# Patient Record
Sex: Female | Born: 2000 | Race: White | Hispanic: No | Marital: Single | Smoking: Never smoker
Health system: Southern US, Community
[De-identification: ages and names within clinical notes are randomized; demographics above are authoritative.]

## PROBLEM LIST (undated history)

## (undated) DIAGNOSIS — Z9889 Other specified postprocedural states: Secondary | ICD-10-CM

## (undated) DIAGNOSIS — F509 Eating disorder, unspecified: Secondary | ICD-10-CM

## (undated) DIAGNOSIS — R6250 Unspecified lack of expected normal physiological development in childhood: Secondary | ICD-10-CM

## (undated) DIAGNOSIS — R5600 Simple febrile convulsions: Secondary | ICD-10-CM

## (undated) DIAGNOSIS — E11649 Type 2 diabetes mellitus with hypoglycemia without coma: Secondary | ICD-10-CM

## (undated) DIAGNOSIS — E109 Type 1 diabetes mellitus without complications: Secondary | ICD-10-CM

## (undated) DIAGNOSIS — F988 Other specified behavioral and emotional disorders with onset usually occurring in childhood and adolescence: Secondary | ICD-10-CM

## (undated) HISTORY — PX: EYE MUSCLE SURGERY: SHX370

## (undated) HISTORY — DX: Simple febrile convulsions: R56.00

## (undated) HISTORY — DX: Unspecified lack of expected normal physiological development in childhood: R62.50

## (undated) HISTORY — DX: Other specified postprocedural states: Z98.890

## (undated) HISTORY — DX: Type 1 diabetes mellitus without complications: E10.9

## (undated) HISTORY — DX: Type 2 diabetes mellitus with hypoglycemia without coma: E11.649

---
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
## 2019-01-23 ENCOUNTER — Other Ambulatory Visit: Payer: Self-pay | Admitting: Pediatrics

## 2019-01-23 DIAGNOSIS — G43009 Migraine without aura, not intractable, without status migrainosus: Secondary | ICD-10-CM

## 2019-01-30 ENCOUNTER — Other Ambulatory Visit (INDEPENDENT_AMBULATORY_CARE_PROVIDER_SITE_OTHER): Payer: Self-pay | Admitting: Family

## 2019-01-30 DIAGNOSIS — IMO0001 Reserved for inherently not codable concepts without codable children: Secondary | ICD-10-CM

## 2019-02-08 ENCOUNTER — Other Ambulatory Visit: Payer: Self-pay | Admitting: Pediatrics

## 2019-02-08 DIAGNOSIS — F4323 Adjustment disorder with mixed anxiety and depressed mood: Secondary | ICD-10-CM

## 2019-02-13 DIAGNOSIS — F509 Eating disorder, unspecified: Secondary | ICD-10-CM | POA: Diagnosis not present

## 2019-02-13 DIAGNOSIS — F331 Major depressive disorder, recurrent, moderate: Secondary | ICD-10-CM | POA: Diagnosis not present

## 2019-02-19 DIAGNOSIS — F509 Eating disorder, unspecified: Secondary | ICD-10-CM | POA: Diagnosis not present

## 2019-02-20 ENCOUNTER — Encounter (INDEPENDENT_AMBULATORY_CARE_PROVIDER_SITE_OTHER): Payer: Self-pay

## 2019-02-26 ENCOUNTER — Encounter (INDEPENDENT_AMBULATORY_CARE_PROVIDER_SITE_OTHER): Payer: Self-pay

## 2019-03-18 ENCOUNTER — Other Ambulatory Visit (INDEPENDENT_AMBULATORY_CARE_PROVIDER_SITE_OTHER): Payer: Self-pay | Admitting: Family

## 2019-03-18 ENCOUNTER — Encounter (INDEPENDENT_AMBULATORY_CARE_PROVIDER_SITE_OTHER): Payer: Self-pay

## 2019-03-18 ENCOUNTER — Ambulatory Visit (INDEPENDENT_AMBULATORY_CARE_PROVIDER_SITE_OTHER): Payer: BLUE CROSS/BLUE SHIELD | Admitting: Family

## 2019-03-18 ENCOUNTER — Encounter (INDEPENDENT_AMBULATORY_CARE_PROVIDER_SITE_OTHER): Payer: Self-pay | Admitting: Family

## 2019-03-18 ENCOUNTER — Other Ambulatory Visit: Payer: Self-pay

## 2019-03-18 VITALS — BP 114/70 | HR 80 | Ht 59.65 in | Wt 138.4 lb

## 2019-03-18 DIAGNOSIS — E782 Mixed hyperlipidemia: Secondary | ICD-10-CM | POA: Diagnosis not present

## 2019-03-18 DIAGNOSIS — R739 Hyperglycemia, unspecified: Secondary | ICD-10-CM | POA: Diagnosis not present

## 2019-03-18 DIAGNOSIS — F54 Psychological and behavioral factors associated with disorders or diseases classified elsewhere: Secondary | ICD-10-CM

## 2019-03-18 DIAGNOSIS — E109 Type 1 diabetes mellitus without complications: Secondary | ICD-10-CM

## 2019-03-18 DIAGNOSIS — F4323 Adjustment disorder with mixed anxiety and depressed mood: Secondary | ICD-10-CM

## 2019-03-18 DIAGNOSIS — F509 Eating disorder, unspecified: Secondary | ICD-10-CM

## 2019-03-18 LAB — POCT GLUCOSE (DEVICE FOR HOME USE): POC Glucose: 91 mg/dl (ref 70–99)

## 2019-03-18 LAB — POCT GLYCOSYLATED HEMOGLOBIN (HGB A1C): Hemoglobin A1C: 6.9 % — AB (ref 4.0–5.6)

## 2019-03-18 NOTE — Patient Instructions (Signed)
-  Always have fast sugar with you in case of low blood sugar (glucose tabs, regular juice or soda, candy) -Always wear your ID that states you have diabetes -Always bring your meter to your visit -Call/Email if you want to review blood sugars   

## 2019-03-18 NOTE — Progress Notes (Signed)
Pediatric Endocrinology Diabetes Consultation Follow-up Visit  [name] 04/21/2001 [id number]  Chief Complaint: Follow-up type 1 diabetes   Verneda SkillHacker, Caroline T, FNP   HPI: Kelli LawlessMadeline  is a 18 y.o. female presenting for follow-up of type 1 diabetes. she is accompanied to this visit by her mother and father.   1. Kelli LawlessMadeline was diagnosed with type 1 diabetes at age 89. At that time she was hospitalized at [facility] and was in DKA. She was in the ICU for 2 days. She was initially followed by Dr. Langston [place]Morris [place] but transferred to this clinic after Dr. Langston MaskerMorris retired. She has been admitted in DKA two additional times since diagnosis. She has been on pump therapy since age 89.  2. Since last visit to [facility] on 12/2018, she reports she has been well.   She reports that she has not been very busy lately. Watching a lot of Netflix. She plans to start school at [employer], doing all her classes online. She is wearing Dexcom G6, finds it very helpful overall. Reports that her insulin went bad twice because of heat. Also finds that when giving injections in abdomen they do not absorb well. She is considering starting insulin pump therapy.   She continues to follow up with counseling and with Adolescent medicine. She is currently on Sertraline and Wellbutrin which she feels is working very well for her.  She had a hard time with her eating disorder for a few months during COVID because she was unable to see her dieticians. She now reports that she is back on track.   Insulin regimen: 35 units of Lantus, Novolog 120/30/5 Hypoglycemia: Able to feel low blood sugars.  No glucagon needed recently.  Dexcom CGm Download   - Avg Bg 176  - Target Range: in 62%, above target 38% and below target 0%   - Pattern of hyperglycemia between 1-5pm.  Med-alert ID: Not currently wearing. Injection sites: arms and hips  Annual labs due: 07/2019 Ophthalmology due: 2019. Discussed importance of annual  dilated eye exam.     3. ROS: Greater than 10 systems reviewed with pertinent positives listed in HPI, otherwise neg. Constitutional: Sleeping well. Good energy and appetite.  Eyes: No changes in vision, denies blurry vision. Wears glasses.  Ears/Nose/Mouth/Throat: No difficulty swallowing. No neck pain  Cardiovascular: No palpitations, denies tachycardia and chest pain.  Respiratory: No increased work of breathing, No SOB  Gastrointestinal: No constipation or diarrhea. No abdominal pain Genitourinary: No nocturia, no polyuria Endocrine: No polydipsia.  No hyperpigmentation Psychiatric: Normal affect.+ anxiety and depression. Currently on Sertaline and Wellbutrin   Past Medical History:   Past Medical History:  Diagnosis Date  . ADD (attention deficit disorder)   . Febrile seizure (HCC)   . History of eye surgery   . Hypoglycemia associated with diabetes (HCC)   . Hypoglycemia associated with diabetes (HCC)   . Physical growth delay   . Type 1 diabetes mellitus not at goal [facility](HCC)     Medications:  Outpatient Encounter Medications as of 03/18/2019  Medication Sig  . ACCU-CHEK FASTCLIX LANCETS MISC Check sugar 10 x daily  . buPROPion (WELLBUTRIN XL) 300 MG 24 hr tablet TAKE 1 TABLET BY MOUTH EVERY DAY  . cloNIDine HCl (KAPVAY) 0.1 MG TB12 ER tablet TAKE 1 TABLET (0.1 MG TOTAL) BY MOUTH AT BEDTIME.  Marland Kitchen. Continuous Blood Gluc Sensor (DEXCOM G6 SENSOR) MISC 1 application by Does not apply route as needed.  . diphenhydrAMINE (BENADRYL) 12.5 MG chewable  tablet Chew 12.5 mg by mouth 4 (four) times daily as needed for allergies.  Marland Kitchen glucagon 1 MG injection Use for Severe Hypoglycemia . Inject 1 mg intramuscularly  . glucose blood test strip CHECK SUGAR 6 X DAILY  . ibuprofen (ADVIL,MOTRIN) 200 MG tablet Take 400 mg by mouth every 6 (six) hours as needed for headache, mild pain or cramping.   . Insulin Pen Needle (BD PEN NEEDLE NANO U/F) 32G X 4 MM MISC Use with insulin pens 6x daily  .  l-methylfolate-B6-B12 (METANX) 3-35-2 MG TABS tablet TAKE 1 TABLET BY MOUTH EVERY DAY  . LANTUS SOLOSTAR 100 UNIT/ML Solostar Pen USE UP TO 50 UNITS DAILY  . levonorgestrel-ethinyl estradiol (LEVORA 0.15/30, 28,) 0.15-30 MG-MCG tablet Take 1 tablet by mouth daily.  . norgestimate-ethinyl estradiol (SPRINTEC 28) 0.25-35 MG-MCG tablet Take 1 tablet daily. Discard placebos and take active pills for continuous cycling  . NOVOLOG FLEXPEN 100 UNIT/ML FlexPen USE UP TO 50 UNITS DAILY  . ondansetron (ZOFRAN) 4 MG tablet Take 1 tablet (4 mg total) by mouth daily as needed for nausea or vomiting.  . sertraline (ZOLOFT) 100 MG tablet TAKE 1 TABLET BY MOUTH EVERY DAY  . sertraline (ZOLOFT) 50 MG tablet TAKE 1 TABLET BY MOUTH EVERY DAY  . SUMAtriptan (IMITREX) 50 MG tablet 1 TAB EVERY 2HRS AS NEEDED MIGRAINE (NO MORE THAN 2 DOSES A DAY MAY REPEAT IN 2HRS IF PERSISTS/RECUR  . [DISCONTINUED] cloNIDine HCl (KAPVAY) 0.1 MG TB12 ER tablet TAKE 1 TABLET (0.1 MG TOTAL) BY MOUTH AT BEDTIME.   No facility-administered encounter medications on file as of 03/18/2019.     Allergies: No Known Allergies  Surgical History: Past Surgical History:  Procedure Laterality Date  . EYE MUSCLE SURGERY      Family History:  Family History  Problem Relation Age of Onset  . Cancer Maternal Grandmother   . Hypertension Maternal Grandfather   . Depression Maternal Grandfather   . Diabetes Paternal Grandfather   . Anxiety disorder Father   . Depression Mother   . Anxiety disorder Sister      Social History: Lives with: Living with mother    Physical Exam:  There were no vitals filed for this visit. There were no vitals taken for this visit. Body mass index: body mass index is unknown because there is no height or weight on file. Blood pressure percentiles are not available for patients who are 18 years or older.  Ht Readings from Last 3 Encounters:  09/03/18 4' 11.45" (1.51 m) (3 %, Z= -1.87)*  08/30/18 4' 11.45"  (1.51 m) (3 %, Z= -1.87)*  07/16/18 4' 11.45" (1.51 m) (3 %, Z= -1.87)*   * Growth percentiles are based on CDC (Girls, 2-20 Years) data.   Wt Readings from Last 3 Encounters:  09/03/18 143 lb 3.2 oz (65 kg) (78 %, Z= 0.78)*  08/30/18 143 lb 3.2 oz (65 kg) (78 %, Z= 0.78)*  07/16/18 136 lb 3.2 oz (61.8 kg) (71 %, Z= 0.54)*   * Growth percentiles are based on CDC (Girls, 2-20 Years) data.   Physical Exam   General: Well developed, well nourished female in no acute distress.  Alert and oriented.  Head: Normocephalic, atraumatic.   Eyes:  Pupils equal and round. EOMI.   Sclera white.  No eye drainage.   Ears/Nose/Mouth/Throat: Nares patent, no nasal drainage.  Normal dentition, mucous membranes moist.   Neck: supple, no cervical lymphadenopathy, no thyromegaly Cardiovascular: regular rate, normal S1/S2, no murmurs Respiratory:  No increased work of breathing.  Lungs clear to auscultation bilaterally.  No wheezes. Abdomen: soft, nontender, nondistended. Normal bowel sounds.  No appreciable masses  Extremities: warm, well perfused, cap refill < 2 sec.   Musculoskeletal: Normal muscle mass.  Normal strength Skin: warm, dry.  No rash or lesions. Neurologic: alert and oriented, normal speech, no tremor  Labs:   Assessment/Plan: Kelli LawlessMadeline is a 18 y.o. female with type 1 diabetes on MDI and CGM therapy. She continues to make good improvements to diabetes care. He hemoglobin A1c has decreased to 6.9% which meets the ADA goal of <7%. Continuing follow up for anxiety and depression with Adolescent medicine.   1-3. DM w/o complication type I, uncontrolled (HCC)/hyperglycemia/Insulin dose change  - 35 units of Lantus  - Novolog 120/30/5 plan  - Reviewed meter and CGM download. Discussed trends and patterns.  - Rotate injection sites to prevent scar tissue.  - bolus 15 minutes prior to eating to limit blood sugar spikes.  - Reviewed carb counting and importance of accurate carb counting.  -  Discussed signs and symptoms of hypoglycemia. Always have glucose available.  - POCT glucose and hemoglobin A1c  - Reviewed growth chart.  - Discussed insulin pump therapy. Gave information on TSlim and OMnipod.   4. Disordered eating - Follow up with Maralyn SagoSarah (therapist)  - Discussed effects that binging and purging have on Type 1 diabetes.   5. Depression/Anxiety  - Sertraline and Wellbutrin  - Close follow up with Adolescent medicine.   6. Maladaptive Behaviors - Discussed barriers to care  - Answered questions.   7. Mixed hyperlipidemia  - 1000 mg of fish oil daily.  - Fasting lipid panel at next visit.   Follow-up:   3 months.   I have spent >25 minutes with >50% of time in counseling, education and instruction. When a patient is on insulin, intensive monitoring of blood glucose levels is necessary to avoid hyperglycemia and hypoglycemia. Severe hyperglycemia/hypoglycemia can lead to hospital admissions and be life threatening.     Gretchen ShortSpenser Tabbetha Kutscher,  FNP-C  Pediatric Specialist  [address]  [place], [id number]  Tele: [phone]

## 2019-03-19 DIAGNOSIS — F411 Generalized anxiety disorder: Secondary | ICD-10-CM | POA: Diagnosis not present

## 2019-03-19 DIAGNOSIS — F5081 Binge eating disorder: Secondary | ICD-10-CM | POA: Diagnosis not present

## 2019-03-29 ENCOUNTER — Other Ambulatory Visit: Payer: Self-pay | Admitting: Pediatrics

## 2019-04-02 DIAGNOSIS — F331 Major depressive disorder, recurrent, moderate: Secondary | ICD-10-CM | POA: Diagnosis not present

## 2019-04-02 DIAGNOSIS — F411 Generalized anxiety disorder: Secondary | ICD-10-CM | POA: Diagnosis not present

## 2019-04-16 DIAGNOSIS — F411 Generalized anxiety disorder: Secondary | ICD-10-CM | POA: Diagnosis not present

## 2019-04-30 DIAGNOSIS — F411 Generalized anxiety disorder: Secondary | ICD-10-CM | POA: Diagnosis not present

## 2019-04-30 DIAGNOSIS — F331 Major depressive disorder, recurrent, moderate: Secondary | ICD-10-CM | POA: Diagnosis not present

## 2019-05-14 ENCOUNTER — Encounter (INDEPENDENT_AMBULATORY_CARE_PROVIDER_SITE_OTHER): Payer: Self-pay

## 2019-05-14 DIAGNOSIS — F411 Generalized anxiety disorder: Secondary | ICD-10-CM | POA: Diagnosis not present

## 2019-05-19 ENCOUNTER — Other Ambulatory Visit (INDEPENDENT_AMBULATORY_CARE_PROVIDER_SITE_OTHER): Payer: Self-pay | Admitting: Family

## 2019-05-28 DIAGNOSIS — F419 Anxiety disorder, unspecified: Secondary | ICD-10-CM | POA: Diagnosis not present

## 2019-06-05 ENCOUNTER — Encounter (INDEPENDENT_AMBULATORY_CARE_PROVIDER_SITE_OTHER): Payer: Self-pay

## 2019-06-06 ENCOUNTER — Other Ambulatory Visit: Payer: Self-pay

## 2019-06-06 DIAGNOSIS — Z20828 Contact with and (suspected) exposure to other viral communicable diseases: Secondary | ICD-10-CM | POA: Diagnosis not present

## 2019-06-06 DIAGNOSIS — Z20822 Contact with and (suspected) exposure to covid-19: Secondary | ICD-10-CM

## 2019-06-08 LAB — NOVEL CORONAVIRUS, NAA: SARS-CoV-2, NAA: NOT DETECTED

## 2019-06-11 DIAGNOSIS — F331 Major depressive disorder, recurrent, moderate: Secondary | ICD-10-CM | POA: Diagnosis not present

## 2019-06-11 DIAGNOSIS — F509 Eating disorder, unspecified: Secondary | ICD-10-CM | POA: Diagnosis not present

## 2019-06-12 ENCOUNTER — Ambulatory Visit (INDEPENDENT_AMBULATORY_CARE_PROVIDER_SITE_OTHER): Payer: BC Managed Care – PPO | Admitting: Pediatrics

## 2019-06-12 DIAGNOSIS — F411 Generalized anxiety disorder: Secondary | ICD-10-CM | POA: Diagnosis not present

## 2019-06-12 DIAGNOSIS — F4323 Adjustment disorder with mixed anxiety and depressed mood: Secondary | ICD-10-CM

## 2019-06-12 DIAGNOSIS — F902 Attention-deficit hyperactivity disorder, combined type: Secondary | ICD-10-CM

## 2019-06-12 DIAGNOSIS — Z3041 Encounter for surveillance of contraceptive pills: Secondary | ICD-10-CM | POA: Diagnosis not present

## 2019-06-12 DIAGNOSIS — G4700 Insomnia, unspecified: Secondary | ICD-10-CM

## 2019-06-12 DIAGNOSIS — F509 Eating disorder, unspecified: Secondary | ICD-10-CM

## 2019-06-12 MED ORDER — ESCITALOPRAM OXALATE 10 MG PO TABS: 15.0000 mg | ORAL_TABLET | Freq: Every day | ORAL | 2 refills | Status: DC

## 2019-06-12 NOTE — Progress Notes (Signed)
THIS RECORD MAY CONTAIN CONFIDENTIAL INFORMATION THAT SHOULD NOT BE RELEASED WITHOUT REVIEW OF THE SERVICE PROVIDER.  Virtual Follow-Up Visit via Video Note  I connected with Kelli Davis 's patient  on 06/12/19 at  3:00 PM EDT by a video enabled telemedicine application and verified that I am speaking with the correct person using two identifiers.    This patient visit was completed through the use of an audio/video or telephone encounter in the setting of the State of Emergency due to the COVID-19 Pandemic.  I discussed that the purpose of this telehealth visit is to provide medical care while limiting exposure to the novel coronavirus.       I discussed the limitations of evaluation and management by telemedicine and the availability of in person appointments.    The patient expressed understanding and agreed to proceed.   The patient was physically located at home in [place] or a state in which I am permitted to provide care. The patient and/or parent/guardian understood that s/he may incur co-pays and cost sharing, and agreed to the telemedicine visit. The visit was reasonable and appropriate under the circumstances given the patient's presentation at the time.   The patient and/or parent/guardian has been advised of the potential risks and limitations of this mode of treatment (including, but not limited to, the absence of in-person examination) and has agreed to be treated using telemedicine. The patient's/patient's family's questions regarding telemedicine have been answered.    As this visit was completed in an ambulatory virtual setting, the patient and/or parent/guardian has also been advised to contact their provider's office for worsening conditions, and seek emergency medical treatment and/or call 911 if the patient deems either necessary.    Kelli Davis is a 18 y.o. female referred by Kelli Davis, Kelli T, FNP here today for follow-up of anxiety, depression,  ADHD.   Growth Chart Viewed? not applicable  Previsit planning completed:  yes   History was provided by the patient.  PCP Confirmed?  yes  My Chart Activated?   yes    Plan from Last Visit:   Continue zoloft 150 mg, wellbutrin xl 300 mg daily   Chief Complaint: Increased anxiety, insomnia   History of Present Illness:  Currently staying at dad's house because her mom has COVID. Mom is doing ok, Kelli LawlessMadeline is negative. She is feeling fine with no symptoms. Relationship with dad is fine... he is just "dad" and is "weird."   She has been waiting for transcripts to go [facility] [employer] but she is going to go in the spring.   She just got a job at [employer].   Hasn'Davis been hungry.   Falling asleep about 5 am and waking about 10 am. She is staying asleep, will get up every now and then. Gets only 4-5 hours of sleep every night.   Stopped taking kapvay at bedtime. There was one night where her sugar went low and she didn'Davis hear it, her mom had to take her up. The alarm was on full volume. She is worried about going to sleep due to these episodes.   Glucose has been going lower at night. Tries to go to sleep with glucose at least at 150. Can be low around 5-7 am. Lows are typically about 50. When her alarm wakes her up she does feel low. Last time she is eating before bed is 930-10 pm. Dad makes dinner late. Not having a bedtime snack.   Very anxious about the state of the  world. This has lead to fatigue and some depressive symptoms. Is doing some walking and some walking around with friends. She is on social media/news all the time. She has started volunteering with sister doing [employer] which she stopped because this was increasing anxiety.   PHQ-SADS Last 3 Score only 06/12/2019 06/06/2018 05/17/2018  PHQ-15 Score 7 17 12   Total GAD-7 Score 18 17 17   Score 17 15 18       No LMP recorded. (Menstrual status: Oral contraceptives).  ROS:    No Known Allergies Outpatient Medications Prior  to Visit  Medication Sig Dispense Refill  . ACCU-CHEK FASTCLIX LANCETS MISC Check sugar 10 x daily 300 each 3  . buPROPion (WELLBUTRIN XL) 300 MG 24 hr tablet TAKE 1 TABLET BY MOUTH EVERY DAY 90 tablet 2  . cloNIDine HCl (KAPVAY) 0.1 MG TB12 ER tablet TAKE 1 TABLET (0.1 MG TOTAL) BY MOUTH AT BEDTIME. 90 tablet 1  . Continuous Blood Gluc Sensor (DEXCOM G6 SENSOR) MISC 1 APPLICATION BY DOES NOT APPLY ROUTE AS NEEDED. 3 each 5  . Continuous Blood Gluc Transmit (DEXCOM G6 TRANSMITTER) MISC 1 APPLICATION BY DOES NOT APPLY ROUTE CONTINUOUS AS NEEDED. 1 each 3  . diphenhydrAMINE (BENADRYL) 12.5 MG chewable tablet Chew 12.5 mg by mouth 4 (four) times daily as needed for allergies.    . fluticasone (FLONASE) 50 MCG/ACT nasal spray SPRAY 1 SPRAY INTO EACH NOSTRIL EVERY DAY    . glucagon 1 MG injection Use for Severe Hypoglycemia . Inject 1 mg intramuscularly 2 each 3  . glucose blood test strip CHECK SUGAR 6 X DAILY 600 each 5  . ibuprofen (ADVIL,MOTRIN) 200 MG tablet Take 400 mg by mouth every 6 (six) hours as needed for headache, mild pain or cramping.     . Insulin Pen Needle (BD PEN NEEDLE NANO U/F) 32G X 4 MM MISC Use with insulin pens 6x daily 200 each 6  . l-methylfolate-B6-B12 (METANX) 3-35-2 MG TABS tablet TAKE 1 TABLET BY MOUTH EVERY DAY 90 tablet 0  . LANTUS SOLOSTAR 100 UNIT/ML Solostar Pen USE UP TO 50 UNITS DAILY 5 pen 5  . levonorgestrel-ethinyl estradiol (LEVORA 0.15/30, 28,) 0.15-30 MG-MCG tablet Take 1 tablet by mouth daily. (Patient not taking: Reported on 03/18/2019) 112 tablet 3  . norgestimate-ethinyl estradiol (SPRINTEC 28) 0.25-35 MG-MCG tablet Take 1 tablet daily. Discard placebos and take active pills for continuous cycling 112 tablet 4  . NOVOLOG FLEXPEN 100 UNIT/ML FlexPen USE UP TO 50 UNITS DAILY 45 mL 1  . ondansetron (ZOFRAN) 4 MG tablet Take 1 tablet (4 mg total) by mouth daily as needed for nausea or vomiting. 30 tablet 1  . sertraline (ZOLOFT) 100 MG tablet TAKE 1 TABLET BY  MOUTH EVERY DAY 90 tablet 1  . sertraline (ZOLOFT) 50 MG tablet TAKE 1 TABLET BY MOUTH EVERY DAY 90 tablet 1  . SUMAtriptan (IMITREX) 50 MG tablet 1 TAB EVERY 2HRS AS NEEDED MIGRAINE (NO MORE THAN 2 DOSES A DAY MAY REPEAT IN 2HRS IF PERSISTS/RECUR 10 tablet 0   No facility-administered medications prior to visit.      Patient Active Problem List   Diagnosis Date Noted  . Insulin dose changed ([place]) 09/03/2018  . Mixed hyperlipidemia 09/03/2018  . Generalized anxiety disorder 09/04/2017  . Oral contraceptive pill surveillance 09/04/2017  . Eating disorder 05/16/2016  . Insomnia 04/12/2016  . Adjustment disorder with mixed anxiety and depressed mood 10/08/2015  . ADHD (attention deficit hyperactivity disorder), combined type 05/20/2015  . Maladaptive  health behaviors affecting medical condition 10/17/2013  . Hyperglycemia 07/11/2011  . Hypoglycemia associated with diabetes [facility])     Past Medical History:  Reviewed and updated?  yes Past Medical History:  Diagnosis Date  . ADD (attention deficit disorder)   . Febrile seizure (HCC)   . History of eye surgery   . Hypoglycemia associated with diabetes (HCC)   . Hypoglycemia associated with diabetes (HCC)   . Physical growth delay   . Type 1 diabetes mellitus not at goal [facility])     Family History: Reviewed and updated? yes Family History  Problem Relation Age of Onset  . Cancer Maternal Grandmother   . Hypertension Maternal Grandfather   . Depression Maternal Grandfather   . Diabetes Paternal Grandfather   . Anxiety disorder Father   . Depression Mother   . Anxiety disorder Sister     Tobacco?  no Drugs/ETOH?  no Partner preference?  both Sexually Active?  no  Pregnancy Prevention:  birth control pills, reviewed condoms & plan B Trauma currently or in the pastt?  no Suicidal or Self-Harm thoughts?   no Guns in the home?  no  The following portions of the patient's history were reviewed and updated as appropriate:  allergies, current medications, past family history, past medical history, past social history, past surgical history and problem list.  Visual Observations/Objective:   General Appearance: Well nourished well developed, in no apparent distress.  Eyes: conjunctiva no swelling or erythema ENT/Mouth: No hoarseness, No cough for duration of visit.  Neck: Supple  Respiratory: Respiratory effort normal, normal rate, no retractions or distress.   Cardio: Appears well-perfused, noncyanotic Musculoskeletal: no obvious deformity Skin: visible skin without rashes, ecchymosis, erythema Neuro: Awake and oriented X 3,  Psych:  normal affect, Insight and Judgment appropriate.    Assessment/Plan: 1. Generalized anxiety disorder Change to lexapro from zoloft. Reviewed genesight testing with patient. More likely to have s/e at higher doses... will monitor closely.  - escitalopram (LEXAPRO) 10 MG tablet; Take 1.5 tablets (15 mg total) by mouth daily.  Dispense: 45 tablet; Refill: 2  2. ADHD (attention deficit hyperactivity disorder), combined type Continue wellbutrin xl 300 mg daily.   3. Adjustment disorder with mixed anxiety and depressed mood Change zoloft to lexparo 15 mg daily. Will consider addition of buspar for anxiety if needed in the future.   4. Oral contraceptive pill surveillance Stable.   5. Eating disorder, unspecified type Stable.  6. Insomnia Would benefit from being back on kapvay, but we need to get her night time glucoses under control so she isn'Davis worried about not waking up to lows. Advised she needs to send mychart message to endo provider. Advised to eat bedtime snack with protein and carb before bed. She was in agreement. I let endo provider know about the concerns as well.     I discussed the assessment and treatment plan with the patient and/or parent/guardian.  They were provided an opportunity to ask questions and all were answered.  They agreed with the plan and  demonstrated an understanding of the instructions. They were advised to call back or seek an in-person evaluation in the emergency room if the symptoms worsen or if the condition fails to improve as anticipated.   Follow-up:   2 weeks   Medical decision-making:   I spent 25 minutes on this telehealth visit inclusive of face-to-face video and care coordination time I was located off site during this encounter.   Alfonso Ramus, FNP  CC: Kelli Skill, FNP, Kelli Skill, FNP

## 2019-06-23 ENCOUNTER — Other Ambulatory Visit: Payer: Self-pay | Admitting: Pediatrics

## 2019-06-23 DIAGNOSIS — F411 Generalized anxiety disorder: Secondary | ICD-10-CM

## 2019-06-24 ENCOUNTER — Ambulatory Visit (INDEPENDENT_AMBULATORY_CARE_PROVIDER_SITE_OTHER): Payer: BC Managed Care – PPO | Admitting: Family

## 2019-06-24 ENCOUNTER — Encounter (INDEPENDENT_AMBULATORY_CARE_PROVIDER_SITE_OTHER): Payer: Self-pay | Admitting: Family

## 2019-06-24 ENCOUNTER — Other Ambulatory Visit: Payer: Self-pay

## 2019-06-24 VITALS — BP 120/84 | HR 95 | Wt 135.8 lb

## 2019-06-24 DIAGNOSIS — Z794 Long term (current) use of insulin: Secondary | ICD-10-CM

## 2019-06-24 DIAGNOSIS — R739 Hyperglycemia, unspecified: Secondary | ICD-10-CM | POA: Diagnosis not present

## 2019-06-24 DIAGNOSIS — F509 Eating disorder, unspecified: Secondary | ICD-10-CM

## 2019-06-24 DIAGNOSIS — E109 Type 1 diabetes mellitus without complications: Secondary | ICD-10-CM | POA: Diagnosis not present

## 2019-06-24 DIAGNOSIS — F54 Psychological and behavioral factors associated with disorders or diseases classified elsewhere: Secondary | ICD-10-CM

## 2019-06-24 DIAGNOSIS — E782 Mixed hyperlipidemia: Secondary | ICD-10-CM

## 2019-06-24 DIAGNOSIS — Z23 Encounter for immunization: Secondary | ICD-10-CM | POA: Diagnosis not present

## 2019-06-24 LAB — POCT GLYCOSYLATED HEMOGLOBIN (HGB A1C): Hemoglobin A1C: 7.2 % — AB (ref 4.0–5.6)

## 2019-06-24 LAB — POCT GLUCOSE (DEVICE FOR HOME USE): POC Glucose: 185 mg/dl — AB (ref 70–99)

## 2019-06-24 NOTE — Patient Instructions (Signed)
Reduce lantus to 33 units  - No bedtime snack unless you cover with novolog per your plan  - Look at insulin pump and start order process for Tslim. Contact Chris.   - Send me blood sugars in 1 weeks to review via mychart.   - follow up 6 weeks.

## 2019-06-24 NOTE — Progress Notes (Signed)
Pediatric Endocrinology Diabetes Consultation Follow-up Visit  [name] 07/26/2001 [id number]  Chief Complaint: Follow-up type 1 diabetes   Kelli SkillHacker, Kelli T, FNP   HPI: Kelli LawlessMadeline  is a 18 y.o. female presenting for follow-up of type 1 diabetes. she is accompanied to this visit by her mother and father.   1. Kelli LawlessMadeline was diagnosed with type 1 diabetes at age 89. At that time she was [facility] and was in DKA. She was in the ICU for 2 days. She was initially followed by Dr. Langston MaskerMorris in [place] but transferred to this clinic after Dr. Langston MaskerMorris retired. She has been admitted in DKA two additional times since diagnosis. She has been on pump therapy since age 89.  2. Since last visit to [facility] on 03/2019, she reports she has been well.   Recently been having difficulty with blood sugars. Feels like there are a lot of swings. Was having frequent low blood sugars overnight. She was told to eat a bedtime snack to prevent hypoglycemia overnight but now is running higher most of the time. She reports occasionally missing her Novolog shots but it is rare. She is doing well with carb counting and using Novolog plan. Wearing Dexcom CGM, no issues.   Wants to get an insulin pump, leaning towards Tslim.   Recently changed anxiety medicine, she is taking Kelli Davis 15 mg daily. She has not been able to tell much of a difference. Feels "pretty anxious" most of the time. Following Adolescent Medicine.   She reports that her eating is "ok". She is living with dad right now because mom had COVID. She states that dad is concerned that she is not eating enough. She will see her counselor Kelli SagoSarah tomorrow to discuss further.   Insulin regimen: 35 units of Lantus, Novolog 120/30/5 Hypoglycemia: Able to feel low blood sugars.  No glucagon needed recently.  Dexcom CGm Download   - Avg Bg 203.   - Target range: in target 46% above target 52% and below target 2% Med-alert ID: Not  currently wearing. Injection sites: arms and hips  Annual labs due: 07/2019 Ophthalmology due: 2019. Discussed importance of annual dilated eye exam.     3. ROS: Greater than 10 systems reviewed with pertinent positives listed in HPI, otherwise neg. Constitutional: Sleeping well. Good energy and appetite.  Eyes: No changes in vision, denies blurry vision. Wears glasses.  Ears/Nose/Mouth/Throat: No difficulty swallowing. No neck pain  Cardiovascular: No palpitations, denies tachycardia and chest pain.  Respiratory: No increased work of breathing, No SOB  Gastrointestinal: No constipation or diarrhea. No abdominal pain Genitourinary: No nocturia, no polyuria Endocrine: No polydipsia.  No hyperpigmentation Psychiatric: Normal affect.+ anxiety and depression. Currently on Kelli Davis.   Past Medical History:   Past Medical History:  Diagnosis Date  . ADD (attention deficit disorder)   . Febrile seizure (HCC)   . History of eye surgery   . Hypoglycemia associated with diabetes (HCC)   . Hypoglycemia associated with diabetes (HCC)   . Physical growth delay   . Type 1 diabetes mellitus not at goal [facility](HCC)     Medications:  Outpatient Encounter Medications as of 06/24/2019  Medication Sig  . ACCU-CHEK FASTCLIX LANCETS MISC Check sugar 10 x daily  . buPROPion (WELLBUTRIN XL) 300 MG 24 hr tablet TAKE 1 TABLET BY MOUTH EVERY DAY  . cloNIDine HCl (KAPVAY) 0.1 MG TB12 ER tablet TAKE 1 TABLET (0.1 MG TOTAL) BY MOUTH AT BEDTIME.  Marland Kitchen. Continuous Blood Gluc Sensor (DEXCOM  G6 SENSOR) MISC 1 APPLICATION BY DOES NOT APPLY ROUTE AS NEEDED.  Marland Kitchen Continuous Blood Gluc Transmit (DEXCOM G6 TRANSMITTER) MISC 1 APPLICATION BY DOES NOT APPLY ROUTE CONTINUOUS AS NEEDED.  Marland Kitchen diphenhydrAMINE (BENADRYL) 12.5 MG chewable tablet Chew 12.5 mg by mouth 4 (four) times daily as needed for allergies.  Marland Kitchen escitalopram (Kelli Davis) 10 MG tablet Take 1.5 tablets (15 mg total) by mouth daily.  . fluticasone (FLONASE) 50 MCG/ACT nasal  spray SPRAY 1 SPRAY INTO EACH NOSTRIL EVERY DAY  . glucagon 1 MG injection Use for Severe Hypoglycemia . Inject 1 mg intramuscularly  . glucose blood test strip CHECK SUGAR 6 X DAILY  . ibuprofen (ADVIL,MOTRIN) 200 MG tablet Take 400 mg by mouth every 6 (six) hours as needed for headache, mild pain or cramping.   . Insulin Pen Needle (BD PEN NEEDLE NANO U/F) 32G X 4 MM MISC Use with insulin pens 6x daily  . l-methylfolate-B6-B12 (METANX) 3-35-2 MG TABS tablet TAKE 1 TABLET BY MOUTH EVERY DAY  . LANTUS SOLOSTAR 100 UNIT/ML Solostar Pen USE UP TO 50 UNITS DAILY  . norgestimate-ethinyl estradiol (SPRINTEC 28) 0.25-35 MG-MCG tablet Take 1 tablet daily. Discard placebos and take active pills for continuous cycling  . NOVOLOG FLEXPEN 100 UNIT/ML FlexPen USE UP TO 50 UNITS DAILY  . ondansetron (ZOFRAN) 4 MG tablet Take 1 tablet (4 mg total) by mouth daily as needed for nausea or vomiting.  . SUMAtriptan (IMITREX) 50 MG tablet 1 TAB EVERY 2HRS AS NEEDED MIGRAINE (NO MORE THAN 2 DOSES A DAY MAY REPEAT IN 2HRS IF PERSISTS/RECUR  . [DISCONTINUED] cloNIDine HCl (KAPVAY) 0.1 MG TB12 ER tablet TAKE 1 TABLET (0.1 MG TOTAL) BY MOUTH AT BEDTIME.   No facility-administered encounter medications on file as of 06/24/2019.     Allergies: No Known Allergies  Surgical History: Past Surgical History:  Procedure Laterality Date  . EYE MUSCLE SURGERY      Family History:  Family History  Problem Relation Age of Onset  . Cancer Maternal Grandmother   . Hypertension Maternal Grandfather   . Depression Maternal Grandfather   . Diabetes Paternal Grandfather   . Anxiety disorder Father   . Depression Mother   . Anxiety disorder Sister      Social History: Lives with: Living with mother    Physical Exam:  Vitals:   06/24/19 1619  BP: 120/84  Pulse: (!) 125  Weight: 135 lb 12.8 oz (61.6 kg)   BP 120/84   Pulse (!) 125   Wt 135 lb 12.8 oz (61.6 kg)   BMI 26.84 kg/m  Body mass index: body mass index  is 26.84 kg/m. Blood pressure percentiles are not available for patients who are 18 years or older.  Ht Readings from Last 3 Encounters:  03/18/19 4' 11.65" (1.515 m) (4 %, Z= -1.80)*  09/03/18 4' 11.45" (1.51 m) (3 %, Z= -1.87)*  08/30/18 4' 11.45" (1.51 m) (3 %, Z= -1.87)*   * Growth percentiles are based on CDC (Girls, 2-20 Years) data.   Wt Readings from Last 3 Encounters:  06/24/19 135 lb 12.8 oz (61.6 kg) (67 %, Z= 0.43)*  03/18/19 138 lb 6.4 oz (62.8 kg) (71 %, Z= 0.56)*  09/03/18 143 lb 3.2 oz (65 kg) (78 %, Z= 0.78)*   * Growth percentiles are based on CDC (Girls, 2-20 Years) data.   Physical Exam   General: Well developed, well nourished female in no acute distress.  Alert and oriented.  Head: Normocephalic, atraumatic.  Eyes:  Pupils equal and round. EOMI.   Sclera white.  No eye drainage.   Ears/Nose/Mouth/Throat: Nares patent, no nasal drainage.  Normal dentition, mucous membranes moist.   Neck: supple, no cervical lymphadenopathy, no thyromegaly Cardiovascular: regular rate, normal S1/S2, no murmurs Respiratory: No increased work of breathing.  Lungs clear to auscultation bilaterally.  No wheezes. Abdomen: soft, nontender, nondistended. Normal bowel sounds.  No appreciable masses  Extremities: warm, well perfused, cap refill < 2 sec.   Musculoskeletal: Normal muscle mass.  Normal strength Skin: warm, dry.  No rash or lesions. Neurologic: alert and oriented, normal speech, no tremor  Labs: Results for orders placed or performed in visit on 06/24/19  POCT Glucose (Device for Home Use)  Result Value Ref Range   Glucose Fasting, POC     POC Glucose 185 (A) 70 - 99 mg/dl  POCT HgB B3Z  Result Value Ref Range   Hemoglobin A1C 7.2 (A) 4.0 - 5.6 %   HbA1c POC (<> result, manual entry)     HbA1c, POC (prediabetic range)     HbA1c, POC (controlled diabetic range)       Assessment/Plan: Jayleena is a 18 y.o. female with type 1 diabetes on MDI and CGM therapy. She  is doing well overall weight diabetes care but has recently been having abnormal swings in glucose levels. She is running higher at night which is likely due to 25 grams of carbs she is eating without covering with Novolog before bed. Will reduce Lantus dose and eliminate the free snack. Hemoglobin A1c is 7.2% today. Will benefit from insulin pump therapy.    1-3. DM w/o complication type I, uncontrolled (HCC)/hyperglycemia/Insulin dose change  - Decrease lantus to 33 units  - Novolog 120/30/5 plan  -- Reviewed insulin CGM download. Discussed trends and patterns.  - Rotate injection sites to prevent scar tissue.  - bolus 15 minutes prior to eating to limit blood sugar spikes.  - Reviewed carb counting and importance of accurate carb counting.  - Discussed signs and symptoms of hypoglycemia. Always have glucose available.  - POCT glucose and hemoglobin A1c  - Reviewed growth chart.  - Discussed insulin pumps and different options available. Discussed pros and cons of pump therapy.   4. Disordered eating - Follow up with Kelli Sago (therapist)  - Discussed effects that binging and purging have on Type 1 diabetes.   5. Depression/Anxiety  - Kelli Davis 15 mg daily  - Close follow up with Adolescent medicine.   6. Mixed hyperlipidemia  - 1000 mg of fish oil daily.    Follow-up:   3 months.   I have spent >40  minutes with >50% of time in counseling, education and instruction. When a patient is on insulin, intensive monitoring of blood glucose levels is necessary to avoid hyperglycemia and hypoglycemia. Severe hyperglycemia/hypoglycemia can lead to hospital admissions and be life threatening.     Gretchen Short,  FNP-C  Pediatric Specialist  [address]  [place], [address]  Tele: [phone]

## 2019-06-26 ENCOUNTER — Ambulatory Visit (INDEPENDENT_AMBULATORY_CARE_PROVIDER_SITE_OTHER): Payer: BC Managed Care – PPO | Admitting: Pediatrics

## 2019-06-26 DIAGNOSIS — F902 Attention-deficit hyperactivity disorder, combined type: Secondary | ICD-10-CM | POA: Diagnosis not present

## 2019-06-26 DIAGNOSIS — F4323 Adjustment disorder with mixed anxiety and depressed mood: Secondary | ICD-10-CM

## 2019-06-26 DIAGNOSIS — G4700 Insomnia, unspecified: Secondary | ICD-10-CM

## 2019-06-26 DIAGNOSIS — Z3041 Encounter for surveillance of contraceptive pills: Secondary | ICD-10-CM

## 2019-06-26 DIAGNOSIS — R5383 Other fatigue: Secondary | ICD-10-CM | POA: Diagnosis not present

## 2019-06-26 DIAGNOSIS — F411 Generalized anxiety disorder: Secondary | ICD-10-CM

## 2019-06-26 MED ORDER — L-METHYLFOLATE-B6-B12 3-35-2 MG PO TABS: 1.0000 | ORAL_TABLET | Freq: Every day | ORAL | 0 refills | Status: DC

## 2019-06-26 NOTE — Progress Notes (Signed)
THIS RECORD MAY CONTAIN CONFIDENTIAL INFORMATION THAT SHOULD NOT BE RELEASED WITHOUT REVIEW OF THE SERVICE PROVIDER.  Virtual Follow-Up Visit via Video Note  I connected with Kelli Davis Capelli 's patient  on 06/26/19 at  2:30 PM EST by a video enabled telemedicine application and verified that I am speaking with the correct person using two identifiers.    This patient visit was completed through the use of an audio/video or telephone encounter in the setting of the State of Emergency due to the COVID-19 Pandemic.  I discussed that the purpose of this telehealth visit is to provide medical care while limiting exposure to the novel coronavirus.       I discussed the limitations of evaluation and management by telemedicine and the availability of in person appointments.    The patient expressed understanding and agreed to proceed.   The patient was physically located at home in [place] or a state in which I am permitted to provide care. The patient and/or parent/guardian understood that s/he may incur co-pays and cost sharing, and agreed to the telemedicine visit. The visit was reasonable and appropriate under the circumstances given the patient's presentation at the time.   The patient and/or parent/guardian has been advised of the potential risks and limitations of this mode of treatment (including, but not limited to, the absence of in-person examination) and has agreed to be treated using telemedicine. The patient's/patient's family's questions regarding telemedicine have been answered.    As this visit was completed in an ambulatory virtual setting, the patient and/or parent/guardian has also been advised to contact their provider's office for worsening conditions, and seek emergency medical treatment and/or call 911 if the patient deems either necessary.   Team Care Documentation:  Team care documentation used during this visit? no Team care members present and location: NA   Kelli Davis  Kelli Davis is a 18 y.o. female referred by Verneda SkillHacker, Tameaka Eichhorn T, FNP here today for follow-up of anxiety, depression, adhd, insomnia.   Growth Chart Viewed? not applicable  Previsit planning completed:  yes   History was provided by the patient.  PCP Confirmed?  yes  My Chart Activated?   yes    Plan from Last Visit:   Contact endo regarding blood sugars   Chief Complaint: Med follow up   History of Present Illness:  Trying to talk parents into getting insulin pump.  Woke up the other night having a panic attack Has had a few random ones Overall anxiety has been better post election Back at mom's house after covid  Feels like there is some difference with lexapro but not sure what the difference is but mom and friends are noticing a difference.   Continuous cycling OCP- having some BTB this month. She has to wear a pad. Ongoing for 3 days.    No LMP recorded. (Menstrual status: Oral contraceptives).  Review of Systems  Constitutional: Negative for malaise/fatigue.  Eyes: Negative for double vision.  Respiratory: Negative for shortness of breath.   Cardiovascular: Negative for chest pain and palpitations.  Gastrointestinal: Negative for abdominal pain, constipation, diarrhea, nausea and vomiting.  Genitourinary: Negative for dysuria.  Musculoskeletal: Negative for joint pain and myalgias.  Skin: Negative for rash.  Neurological: Negative for dizziness and headaches.  Endo/Heme/Allergies: Does not bruise/bleed easily.  Psychiatric/Behavioral: Negative for depression. The patient is nervous/anxious and has insomnia.      No Known Allergies Outpatient Medications Prior to Visit  Medication Sig Dispense Refill  . ACCU-CHEK FASTCLIX LANCETS MISC  Check sugar 10 x daily 300 each 3  . buPROPion (WELLBUTRIN XL) 300 MG 24 hr tablet TAKE 1 TABLET BY MOUTH EVERY DAY 90 tablet 2  . cloNIDine HCl (KAPVAY) 0.1 MG TB12 ER tablet TAKE 1 TABLET (0.1 MG TOTAL) BY MOUTH AT BEDTIME. 90  tablet 1  . Continuous Blood Gluc Sensor (DEXCOM G6 SENSOR) MISC 1 APPLICATION BY DOES NOT APPLY ROUTE AS NEEDED. 3 each 5  . Continuous Blood Gluc Transmit (DEXCOM G6 TRANSMITTER) MISC 1 APPLICATION BY DOES NOT APPLY ROUTE CONTINUOUS AS NEEDED. 1 each 3  . diphenhydrAMINE (BENADRYL) 12.5 MG chewable tablet Chew 12.5 mg by mouth 4 (four) times daily as needed for allergies.    Marland Kitchen escitalopram (LEXAPRO) 10 MG tablet Take 1.5 tablets (15 mg total) by mouth daily. 45 tablet 2  . fluticasone (FLONASE) 50 MCG/ACT nasal spray SPRAY 1 SPRAY INTO EACH NOSTRIL EVERY DAY    . glucagon 1 MG injection Use for Severe Hypoglycemia . Inject 1 mg intramuscularly 2 each 3  . glucose blood test strip CHECK SUGAR 6 X DAILY 600 each 5  . ibuprofen (ADVIL,MOTRIN) 200 MG tablet Take 400 mg by mouth every 6 (six) hours as needed for headache, mild pain or cramping.     . Insulin Pen Needle (BD PEN NEEDLE NANO U/F) 32G X 4 MM MISC Use with insulin pens 6x daily 200 each 6  . l-methylfolate-B6-B12 (METANX) 3-35-2 MG TABS tablet TAKE 1 TABLET BY MOUTH EVERY DAY 90 tablet 0  . LANTUS SOLOSTAR 100 UNIT/ML Solostar Pen USE UP TO 50 UNITS DAILY 5 pen 5  . norgestimate-ethinyl estradiol (SPRINTEC 28) 0.25-35 MG-MCG tablet Take 1 tablet daily. Discard placebos and take active pills for continuous cycling 112 tablet 4  . NOVOLOG FLEXPEN 100 UNIT/ML FlexPen USE UP TO 50 UNITS DAILY 45 mL 1  . ondansetron (ZOFRAN) 4 MG tablet Take 1 tablet (4 mg total) by mouth daily as needed for nausea or vomiting. 30 tablet 1  . SUMAtriptan (IMITREX) 50 MG tablet 1 TAB EVERY 2HRS AS NEEDED MIGRAINE (NO MORE THAN 2 DOSES A DAY MAY REPEAT IN 2HRS IF PERSISTS/RECUR 10 tablet 0   No facility-administered medications prior to visit.      Patient Active Problem List   Diagnosis Date Noted  . Insulin dose changed ([place]) 09/03/2018  . Mixed hyperlipidemia 09/03/2018  . Generalized anxiety disorder 09/04/2017  . Oral contraceptive pill surveillance  09/04/2017  . Eating disorder 05/16/2016  . Insomnia 04/12/2016  . Adjustment disorder with mixed anxiety and depressed mood 10/08/2015  . ADHD (attention deficit hyperactivity disorder), combined type 05/20/2015  . Maladaptive health behaviors affecting medical condition 10/17/2013  . Hyperglycemia 07/11/2011  . Hypoglycemia associated with diabetes [facility])     Past Medical History:  Reviewed and updated?  yes Past Medical History:  Diagnosis Date  . ADD (attention deficit disorder)   . Febrile seizure ([place])   . History of eye surgery   . Hypoglycemia associated with diabetes ([place])   . Hypoglycemia associated with diabetes ([place])   . Physical growth delay   . Type 1 diabetes mellitus not at goal [facility])     Family History: Reviewed and updated? yes Family History  Problem Relation Age of Onset  . Cancer Maternal Grandmother   . Hypertension Maternal Grandfather   . Depression Maternal Grandfather   . Diabetes Paternal Grandfather   . Anxiety disorder Father   . Depression Mother   . Anxiety disorder Sister  The following portions of the patient's history were reviewed and updated as appropriate: allergies, current medications, past family history, past medical history, past social history, past surgical history and problem list.  Visual Observations/Objective:   General Appearance: Well nourished well developed, in no apparent distress.  Eyes: conjunctiva no swelling or erythema ENT/Mouth: No hoarseness, No cough for duration of visit.  Neck: Supple  Respiratory: Respiratory effort normal, normal rate, no retractions or distress.   Cardio: Appears well-perfused, noncyanotic Musculoskeletal: no obvious deformity Skin: visible skin without rashes, ecchymosis, erythema Neuro: Awake and oriented X 3,  Psych:  normal affect, Insight and Judgment appropriate.    Assessment/Plan: 1. Fatigue, unspecified type Continue l-mf b complex  - l-methylfolate-B6-B12 (METANX) 3-35-2  MG TABS tablet; Take 1 tablet by mouth daily.  Dispense: 90 tablet; Refill: 0  2. ADHD (attention deficit hyperactivity disorder), combined type Continue wellbutrin xl 300 mg daily   3. Adjustment disorder with mixed anxiety and depressed mood Continue lexapro 15 mg daily. Change seem to be improving anxiety sx   4. Oral contraceptive pill surveillance Continue ocp for now. We discussed if she continues to have btb she would be a good candidate for IUD placement. She was in agreement with this.   5. Insomnia, unspecified type Restart kapvay at bedtime now that glucoses are better overnight.   6. Generalized anxiety disorder Continue lexapro.     I discussed the assessment and treatment plan with the patient and/or parent/guardian.  They were provided an opportunity to ask questions and all were answered.  They agreed with the plan and demonstrated an understanding of the instructions. They were advised to call back or seek an in-person evaluation in the emergency room if the symptoms worsen or if the condition fails to improve as anticipated.   Follow-up:  6 weeks   Medical decision-making:   I spent 25 minutes on this telehealth visit inclusive of face-to-face video and care coordination time I was located off site during this encounter.   Alfonso Ramus, FNP    CC: [name], FNP, [name], FNP

## 2019-06-29 ENCOUNTER — Other Ambulatory Visit (INDEPENDENT_AMBULATORY_CARE_PROVIDER_SITE_OTHER): Payer: Self-pay | Admitting: Family

## 2019-06-29 ENCOUNTER — Encounter (INDEPENDENT_AMBULATORY_CARE_PROVIDER_SITE_OTHER): Payer: Self-pay

## 2019-06-29 DIAGNOSIS — E1065 Type 1 diabetes mellitus with hyperglycemia: Secondary | ICD-10-CM

## 2019-07-04 ENCOUNTER — Other Ambulatory Visit: Payer: Self-pay | Admitting: Pediatrics

## 2019-07-04 DIAGNOSIS — F411 Generalized anxiety disorder: Secondary | ICD-10-CM

## 2019-07-17 ENCOUNTER — Other Ambulatory Visit: Payer: Self-pay | Admitting: Pediatrics

## 2019-07-17 DIAGNOSIS — F411 Generalized anxiety disorder: Secondary | ICD-10-CM

## 2019-07-17 MED ORDER — ESCITALOPRAM OXALATE 10 MG PO TABS: ORAL_TABLET | ORAL | 1 refills | Status: DC

## 2019-07-30 ENCOUNTER — Other Ambulatory Visit: Payer: Self-pay | Admitting: Pediatrics

## 2019-07-30 DIAGNOSIS — F4323 Adjustment disorder with mixed anxiety and depressed mood: Secondary | ICD-10-CM

## 2019-08-03 ENCOUNTER — Other Ambulatory Visit: Payer: Self-pay | Admitting: Pediatrics

## 2019-08-03 DIAGNOSIS — Z3041 Encounter for surveillance of contraceptive pills: Secondary | ICD-10-CM

## 2019-08-13 ENCOUNTER — Ambulatory Visit (INDEPENDENT_AMBULATORY_CARE_PROVIDER_SITE_OTHER): Payer: BC Managed Care – PPO | Admitting: Family

## 2019-08-19 ENCOUNTER — Ambulatory Visit: Payer: BC Managed Care – PPO | Admitting: Pediatrics

## 2019-09-19 ENCOUNTER — Other Ambulatory Visit (INDEPENDENT_AMBULATORY_CARE_PROVIDER_SITE_OTHER): Payer: Self-pay | Admitting: Family

## 2019-09-22 ENCOUNTER — Other Ambulatory Visit: Payer: Self-pay | Admitting: Pediatrics

## 2019-09-22 DIAGNOSIS — R5383 Other fatigue: Secondary | ICD-10-CM

## 2019-10-07 ENCOUNTER — Ambulatory Visit (INDEPENDENT_AMBULATORY_CARE_PROVIDER_SITE_OTHER): Payer: BC Managed Care – PPO | Admitting: Family

## 2019-10-07 ENCOUNTER — Other Ambulatory Visit: Payer: Self-pay

## 2019-10-07 ENCOUNTER — Encounter (INDEPENDENT_AMBULATORY_CARE_PROVIDER_SITE_OTHER): Payer: Self-pay | Admitting: Family

## 2019-10-07 VITALS — BP 120/80 | HR 84 | Ht 59.49 in | Wt 126.8 lb

## 2019-10-07 DIAGNOSIS — R7309 Other abnormal glucose: Secondary | ICD-10-CM

## 2019-10-07 DIAGNOSIS — F509 Eating disorder, unspecified: Secondary | ICD-10-CM

## 2019-10-07 DIAGNOSIS — R739 Hyperglycemia, unspecified: Secondary | ICD-10-CM | POA: Diagnosis not present

## 2019-10-07 DIAGNOSIS — E782 Mixed hyperlipidemia: Secondary | ICD-10-CM | POA: Diagnosis not present

## 2019-10-07 DIAGNOSIS — E109 Type 1 diabetes mellitus without complications: Secondary | ICD-10-CM

## 2019-10-07 DIAGNOSIS — F4323 Adjustment disorder with mixed anxiety and depressed mood: Secondary | ICD-10-CM

## 2019-10-07 LAB — POCT GLYCOSYLATED HEMOGLOBIN (HGB A1C): Hemoglobin A1C: 7.2 % — AB (ref 4.0–5.6)

## 2019-10-07 LAB — POCT GLUCOSE (DEVICE FOR HOME USE): POC Glucose: 210 mg/dl — AB (ref 70–99)

## 2019-10-07 MED ORDER — BAQSIMI ONE PACK 3 MG/DOSE NA POWD: 1.0000 | NASAL | 1 refills | Status: AC | PRN

## 2019-10-07 NOTE — Patient Instructions (Signed)
-  Always have fast sugar with you in case of low blood sugar (glucose tabs, regular juice or soda, candy) -Always wear your ID that states you have diabetes -Always bring your meter to your visit -Call/Email if you want to review blood sugars  - 32 units of lantus  Novolog per plan   Try to avoid grazing.

## 2019-10-07 NOTE — Progress Notes (Signed)
Pediatric Endocrinology Diabetes Consultation Follow-up Visit  Kelli Davis 11-14-2000 [id number]  Chief Complaint: Follow-up type 1 diabetes   Kelli Skill, FNP   HPI: Kelli Davis  is a 19 y.o. Davis presenting for follow-up of type 1 diabetes. she is accompanied to this visit by her mother and father.   1. Kelli Davis was diagnosed with type 1 diabetes at age 32. At that time she was hospitalized at [facility] center and was in DKA. She was in the ICU for 2 days. She was initially followed by Dr. Langston Davis in [place] but transferred to this clinic after Dr. Langston Davis retired. She has been admitted in DKA two additional times since diagnosis. She has been on pump therapy since age 40.  2. Since last visit to [facility] on 06/2019, she reports she has been well.   She recently stopped her job due to transportation issues, she is waiting on DMV to get her license. She is splitting time with mom and dad again, but mainly staying with her dad. She reports that her diabetes care has been ok but a little "chaotic" and she has been chasing her blood sugars. She is "not doing anything" so she is snacking frequently and stacking insulin. She is wearing Dexcom CGm which is working well for her.  She has started working out 3-4 x per week which she feels has helped both her blood sugars and her mood.   She is following up with Adolescent medicine, taking 15 mg of lexapro per day. She reports that it has made a "huge" difference.   Insulin regimen: 32 units of Lantus, Novolog 120/30/5 Hypoglycemia: Able to feel low blood sugars.  No glucagon needed recently.  Dexcom CGm Download    Med-alert ID: Not currently wearing. Injection sites: arms and hips  Annual labs due: 07/2019 Ophthalmology due: 2019. Discussed importance of annual dilated eye exam.     3. ROS: Greater than 10 systems reviewed with pertinent positives listed in HPI, otherwise neg. Constitutional: Sleeping well. 9 lbs weight  loss  Eyes: No changes in vision, denies blurry vision. Wears glasses.  Ears/Nose/Mouth/Throat: No difficulty swallowing. No neck pain  Cardiovascular: No palpitations, denies tachycardia  Respiratory: No increased work of breathing, No SOB  Gastrointestinal: No constipation or diarrhea. No abdominal pain Genitourinary: No nocturia, no polyuria Endocrine: No polydipsia.  No hyperpigmentation Psychiatric: Normal affect.+ anxiety and depression. Currently on Lexapro.   Past Medical History:   Past Medical History:  Diagnosis Date  . ADD (attention deficit disorder)   . Febrile seizure ([facility])   . History of eye surgery   . Hypoglycemia associated with diabetes ([facility])   . Hypoglycemia associated with diabetes ([facility])   . Physical growth delay   . Type 1 diabetes mellitus not at goal [facility])     Medications:  Outpatient Encounter Medications as of 10/07/2019  Medication Sig  . ACCU-CHEK FASTCLIX LANCETS MISC Check sugar 10 x daily  . buPROPion (WELLBUTRIN XL) 300 MG 24 hr tablet TAKE 1 TABLET BY MOUTH EVERY DAY  . cloNIDine HCl (KAPVAY) 0.1 MG TB12 ER tablet TAKE 1 TABLET (0.1 MG TOTAL) BY MOUTH AT BEDTIME.  Marland Kitchen Continuous Blood Gluc Sensor (DEXCOM G6 SENSOR) MISC 1 APPLICATION BY DOES NOT APPLY ROUTE AS NEEDED.  Marland Kitchen Continuous Blood Gluc Transmit (DEXCOM G6 TRANSMITTER) MISC 1 APPLICATION BY DOES NOT APPLY ROUTE CONTINUOUS AS NEEDED.  Marland Kitchen escitalopram (LEXAPRO) 10 MG tablet TAKE 1 AND 1/2 TABLETS DAILY BY MOUTH  . fluticasone (FLONASE) 50 MCG/ACT  nasal spray SPRAY 1 SPRAY INTO EACH NOSTRIL EVERY DAY  . glucose blood test strip CHECK SUGAR 6 X DAILY  . ibuprofen (ADVIL,MOTRIN) 200 MG tablet Take 400 mg by mouth every 6 (six) hours as needed for headache, mild pain or cramping.   . Insulin Pen Needle (BD PEN NEEDLE NANO U/F) 32G X 4 MM MISC Use with insulin pens 6x daily  . l-methylfolate-B6-B12 (METANX) 3-35-2 MG TABS tablet TAKE 1 TABLET BY MOUTH EVERY DAY  . LANTUS SOLOSTAR 100 UNIT/ML Solostar  Pen USE UP TO 50 UNITS DAILY  . norgestimate-ethinyl estradiol (SPRINTEC 28) 0.25-35 MG-MCG tablet TAKE 1 TABLET DAILY. DISCARD PLACEBOS AND TAKE ACTIVE PILLS FOR CONTINUOUS CYCLING  . NOVOLOG FLEXPEN 100 UNIT/ML FlexPen USE UP TO 50 UNITS DAILY  . ondansetron (ZOFRAN) 4 MG tablet Take 1 tablet (4 mg total) by mouth daily as needed for nausea or vomiting.  . SUMAtriptan (IMITREX) 50 MG tablet 1 TAB EVERY 2HRS AS NEEDED MIGRAINE (NO MORE THAN 2 DOSES A DAY MAY REPEAT IN 2HRS IF PERSISTS/RECUR  . [DISCONTINUED] glucagon 1 MG injection Use for Severe Hypoglycemia . Inject 1 mg intramuscularly  . diphenhydrAMINE (BENADRYL) 12.5 MG chewable tablet Chew 12.5 mg by mouth 4 (four) times daily as needed for allergies.  . Glucagon (BAQSIMI ONE PACK) 3 MG/DOSE POWD Place 1 Dose into the nose as needed.  . [DISCONTINUED] cloNIDine HCl (KAPVAY) 0.1 MG TB12 ER tablet TAKE 1 TABLET (0.1 MG TOTAL) BY MOUTH AT BEDTIME.   No facility-administered encounter medications on file as of 10/07/2019.    Allergies: No Known Allergies  Surgical History: Past Surgical History:  Procedure Laterality Date  . EYE MUSCLE SURGERY      Family History:  Family History  Problem Relation Age of Onset  . Cancer Maternal Grandmother   . Hypertension Maternal Grandfather   . Depression Maternal Grandfather   . Diabetes Paternal Grandfather   . Anxiety disorder Father   . Depression Mother   . Anxiety disorder Sister      Social History: Lives with: Living with mother    Physical Exam:  Vitals:   10/07/19 0903  BP: 120/80  Pulse: 84  Weight: 126 lb 12.8 oz (57.5 kg)  Height: 4' 11.49" (1.511 m)   BP 120/80   Pulse 84   Ht 4' 11.49" (1.511 m)   Wt 126 lb 12.8 oz (57.5 kg)   BMI 25.19 kg/m  Body mass index: body mass index is 25.19 kg/m. Blood pressure percentiles are not available for patients who are 18 years or older.  Ht Readings from Last 3 Encounters:  10/07/19 4' 11.49" (1.511 m) (3 %, Z= -1.87)*   03/18/19 4' 11.65" (1.515 m) (4 %, Z= -1.80)*  09/03/18 4' 11.45" (1.51 m) (3 %, Z= -1.87)*   * Growth percentiles are based on CDC (Girls, 2-20 Years) data.   Wt Readings from Last 3 Encounters:  10/07/19 126 lb 12.8 oz (57.5 kg) (50 %, Z= 0.00)*  06/24/19 135 lb 12.8 oz (61.6 kg) (67 %, Z= 0.43)*  03/18/19 138 lb 6.4 oz (62.8 kg) (71 %, Z= 0.56)*   * Growth percentiles are based on CDC (Girls, 2-20 Years) data.   Physical Exam   General: Well developed, well nourished Davis in no acute distress.  Alert and oriented.  Head: Normocephalic, atraumatic.   Eyes:  Pupils equal and round. EOMI.   Sclera white.  No eye drainage.  + glasses  Ears/Nose/Mouth/Throat: Nares patent, no nasal drainage.  Normal dentition, mucous membranes moist.   Neck: supple, no cervical lymphadenopathy, no thyromegaly Cardiovascular: regular rate, normal S1/S2, no murmurs Respiratory: No increased work of breathing.  Lungs clear to auscultation bilaterally.  No wheezes. Abdomen: soft, nontender, nondistended. Normal bowel sounds.  No appreciable masses  Extremities: warm, well perfused, cap refill < 2 sec.   Musculoskeletal: Normal muscle mass.  Normal strength Skin: warm, dry.  No rash or lesions. Neurologic: alert and oriented, normal speech, no tremor   Labs: Results for orders placed or performed in visit on 10/07/19  POCT Glucose (Device for Home Use)  Result Value Ref Range   Glucose Fasting, POC     POC Glucose 210 (A) 70 - 99 mg/dl  POCT glycosylated hemoglobin (Hb A1C)  Result Value Ref Range   Hemoglobin A1C 7.2 (A) 4.0 - 5.6 %   HbA1c POC (<> result, manual entry)     HbA1c, POC (prediabetic range)     HbA1c, POC (controlled diabetic range)       Assessment/Plan: Lawrie is a 19 y.o. Davis with type 1 diabetes on MDI and CGM therapy. Having more variability in blood sugars recently due to snacking/stacking insulin. Her hemoglobin A1c is 7.2% which is slightly higher then ADA goal of  <7%. Anxiety and depression improving on lexapro.   1-3. DM w/o complication type I, uncontrolled (HCC)/hyperglycemia/Insulin dose change  - 32 units of Lantus   - Novolog 120/30/5 plan  - Reviewed meter and CGM download. Discussed trends and patterns.  - Rotate injection sites to prevent scar tissue.  - bolus 15 minutes prior to eating to limit blood sugar spikes.  - Reviewed carb counting and importance of accurate carb counting.  - Discussed signs and symptoms of hypoglycemia. Always have glucose available.  - POCT glucose and hemoglobin A1c  - Reviewed growth chart.  - Lipid panel, TFTs and microalbumin ordered.   4. Disordered eating - Follow up with Judson Roch (therapist)   - Discussed how binging and purging can effect diabetes.   5. Depression/Anxiety  - Lexapro 15 mg daily  - Close follow up with Adolescent medicine.   6. Mixed hyperlipidemia  - Take 1000 mg of fish oil daily  - Lipid panel ordered    Follow-up:   3 months.   >45 spent today reviewing the medical chart, counseling the patient/family, and documenting today's visit.  When a patient is on insulin, intensive monitoring of blood glucose levels is necessary to avoid hyperglycemia and hypoglycemia. Severe hyperglycemia/hypoglycemia can lead to hospital admissions and be life threatening.     Hermenia Bers,  FNP-C  Pediatric Specialist  [address] [place]  [place], [address]  Tele: [phone]

## 2019-10-08 LAB — LIPID PANEL
Cholesterol: 294 mg/dL — ABNORMAL HIGH (ref <170)
HDL: 102 mg/dL (ref >45)
LDL Cholesterol (Calc): 167 mg/dL (calc) — ABNORMAL HIGH (ref <110)
Non-HDL Cholesterol (Calc): 192 mg/dL (calc) — ABNORMAL HIGH (ref <120)
Total CHOL/HDL Ratio: 2.9 (calc) (ref <5.0)
Triglycerides: 119 mg/dL — ABNORMAL HIGH (ref <90)

## 2019-10-08 LAB — MICROALBUMIN / CREATININE URINE RATIO
Creatinine, Urine: 73 mg/dL (ref 20–275)
Microalb Creat Ratio: 15 mcg/mg creat (ref <30)
Microalb, Ur: 1.1 mg/dL

## 2019-10-08 LAB — TSH: TSH: 1.91 mIU/L

## 2019-10-08 LAB — T4, FREE: Free T4: 1 ng/dL (ref 0.8–1.4)

## 2019-10-18 ENCOUNTER — Other Ambulatory Visit (INDEPENDENT_AMBULATORY_CARE_PROVIDER_SITE_OTHER): Payer: Self-pay | Admitting: Family

## 2019-10-18 DIAGNOSIS — E1065 Type 1 diabetes mellitus with hyperglycemia: Secondary | ICD-10-CM

## 2019-11-29 DIAGNOSIS — Z20828 Contact with and (suspected) exposure to other viral communicable diseases: Secondary | ICD-10-CM | POA: Diagnosis not present

## 2019-11-29 DIAGNOSIS — Z03818 Encounter for observation for suspected exposure to other biological agents ruled out: Secondary | ICD-10-CM | POA: Diagnosis not present

## 2020-01-06 ENCOUNTER — Ambulatory Visit (INDEPENDENT_AMBULATORY_CARE_PROVIDER_SITE_OTHER): Payer: BC Managed Care – PPO | Admitting: Family

## 2020-01-07 ENCOUNTER — Ambulatory Visit (INDEPENDENT_AMBULATORY_CARE_PROVIDER_SITE_OTHER): Payer: BC Managed Care – PPO | Admitting: Family

## 2020-01-07 NOTE — Progress Notes (Deleted)
Pediatric Endocrinology Diabetes Consultation Follow-up Visit  Kelli Davis 2000/09/15 [id number]  Chief Complaint: Follow-up type 1 diabetes   [name], FNP   HPI: Kelli Davis  is a 19 y.o. female presenting for follow-up of type 1 diabetes. she is accompanied to this visit by her mother and father.   22. Kelli Davis was diagnosed with type 1 diabetes at age 69. At that time she was hospitalized at [facility] center and was in DKA. She was in the ICU for 2 days. She was initially followed by Dr. Lynnette Caffey in [place] but transferred to this clinic after Dr. Lynnette Caffey retired. She has been admitted in DKA two additional times since diagnosis. She has been on pump therapy since age 17.  2. Since last visit to [facility] on 09/2019, she reports she has been well.   She recently stopped her job due to transportation issues, she is waiting on DMV to get her license. She is splitting time with mom and dad again, but mainly staying with her dad. She reports that her diabetes care has been ok but a little "chaotic" and she has been chasing her blood sugars. She is "not doing anything" so she is snacking frequently and stacking insulin. She is wearing Dexcom CGm which is working well for her.  She has started working out 3-4 x per week which she feels has helped both her blood sugars and her mood.   She is following up with Adolescent medicine, taking 15 mg of lexapro per day. She reports that it has made a "huge" difference.   Insulin regimen: 32 units of Lantus, Novolog 120/30/5 Hypoglycemia: Able to feel low blood sugars.  No glucagon needed recently.  Dexcom CGm Download    Med-alert ID: Not currently wearing. Injection sites: arms and hips  Annual labs due: 07/2019 Ophthalmology due: 2019. Discussed importance of annual dilated eye exam.     3. ROS: Greater than 10 systems reviewed with pertinent positives listed in HPI, otherwise neg. Constitutional: Sleeping well. 9 lbs weight  loss  Eyes: No changes in vision, denies blurry vision. Wears glasses.  Ears/Nose/Mouth/Throat: No difficulty swallowing. No neck pain  Cardiovascular: No palpitations, denies tachycardia  Respiratory: No increased work of breathing, No SOB  Gastrointestinal: No constipation or diarrhea. No abdominal pain Genitourinary: No nocturia, no polyuria Endocrine: No polydipsia.  No hyperpigmentation Psychiatric: Normal affect.+ anxiety and depression. Currently on Lexapro.   Past Medical History:   Past Medical History:  Diagnosis Date  . ADD (attention deficit disorder)   . Febrile seizure ([place])   . History of eye surgery   . Hypoglycemia associated with diabetes ([place])   . Hypoglycemia associated with diabetes ([place])   . Physical growth delay   . Type 1 diabetes mellitus not at goal [facility])     Medications:  Outpatient Encounter Medications as of 01/07/2020  Medication Sig  . ACCU-CHEK FASTCLIX LANCETS MISC Check sugar 10 x daily  . buPROPion (WELLBUTRIN XL) 300 MG 24 hr tablet TAKE 1 TABLET BY MOUTH EVERY DAY  . cloNIDine HCl (KAPVAY) 0.1 MG TB12 ER tablet TAKE 1 TABLET (0.1 MG TOTAL) BY MOUTH AT BEDTIME.  Marland Kitchen Continuous Blood Gluc Sensor (DEXCOM G6 SENSOR) MISC 1 APPLICATION BY DOES NOT APPLY ROUTE AS NEEDED.  Marland Kitchen Continuous Blood Gluc Transmit (DEXCOM G6 TRANSMITTER) MISC 1 APPLICATION BY DOES NOT APPLY ROUTE CONTINUOUS AS NEEDED.  Marland Kitchen diphenhydrAMINE (BENADRYL) 12.5 MG chewable tablet Chew 12.5 mg by mouth 4 (four) times daily as needed for allergies.  Marland Kitchen  escitalopram (LEXAPRO) 10 MG tablet TAKE 1 AND 1/2 TABLETS DAILY BY MOUTH  . fluticasone (FLONASE) 50 MCG/ACT nasal spray SPRAY 1 SPRAY INTO EACH NOSTRIL EVERY DAY  . Glucagon (BAQSIMI ONE PACK) 3 MG/DOSE POWD Place 1 Dose into the nose as needed.  Marland Kitchen glucose blood test strip CHECK SUGAR 6 X DAILY  . ibuprofen (ADVIL,MOTRIN) 200 MG tablet Take 400 mg by mouth every 6 (six) hours as needed for headache, mild pain or cramping.   . Insulin Pen  Needle (BD PEN NEEDLE NANO U/F) 32G X 4 MM MISC Use with insulin pens 6x daily  . l-methylfolate-B6-B12 (METANX) 3-35-2 MG TABS tablet TAKE 1 TABLET BY MOUTH EVERY DAY  . LANTUS SOLOSTAR 100 UNIT/ML Solostar Pen USE UP TO 50 UNITS DAILY  . norgestimate-ethinyl estradiol (SPRINTEC 28) 0.25-35 MG-MCG tablet TAKE 1 TABLET DAILY. DISCARD PLACEBOS AND TAKE ACTIVE PILLS FOR CONTINUOUS CYCLING  . NOVOLOG FLEXPEN 100 UNIT/ML FlexPen USE UP TO 50 UNITS DAILY  . SUMAtriptan (IMITREX) 50 MG tablet 1 TAB EVERY 2HRS AS NEEDED MIGRAINE (NO MORE THAN 2 DOSES A DAY MAY REPEAT IN 2HRS IF PERSISTS/RECUR  . [DISCONTINUED] cloNIDine HCl (KAPVAY) 0.1 MG TB12 ER tablet TAKE 1 TABLET (0.1 MG TOTAL) BY MOUTH AT BEDTIME.   No facility-administered encounter medications on file as of 01/07/2020.    Allergies: No Known Allergies  Surgical History: Past Surgical History:  Procedure Laterality Date  . EYE MUSCLE SURGERY      Family History:  Family History  Problem Relation Age of Onset  . Cancer Maternal Grandmother   . Hypertension Maternal Grandfather   . Depression Maternal Grandfather   . Diabetes Paternal Grandfather   . Anxiety disorder Father   . Depression Mother   . Anxiety disorder Sister      Social History: Lives with: Living with mother    Physical Exam:  There were no vitals filed for this visit. There were no vitals taken for this visit. Body mass index: body mass index is unknown because there is no height or weight on file. Blood pressure percentiles are not available for patients who are 18 years or older.  Ht Readings from Last 3 Encounters:  10/07/19 4' 11.49" (1.511 m) (3 %, Z= -1.87)*  03/18/19 4' 11.65" (1.515 m) (4 %, Z= -1.80)*  09/03/18 4' 11.45" (1.51 m) (3 %, Z= -1.87)*   * Growth percentiles are based on CDC (Girls, 2-20 Years) data.   Wt Readings from Last 3 Encounters:  10/07/19 126 lb 12.8 oz (57.5 kg) (50 %, Z= 0.00)*  06/24/19 135 lb 12.8 oz (61.6 kg) (67 %, Z=  0.43)*  03/18/19 138 lb 6.4 oz (62.8 kg) (71 %, Z= 0.56)*   * Growth percentiles are based on CDC (Girls, 2-20 Years) data.   Physical Exam   General: Well developed, well nourished female in no acute distress.   Head: Normocephalic, atraumatic.   Eyes:  Pupils equal and round. EOMI.   Sclera white.  No eye drainage.   Ears/Nose/Mouth/Throat: Nares patent, no nasal drainage.  Normal dentition, mucous membranes moist.   Neck: supple, no cervical lymphadenopathy, no thyromegaly Cardiovascular: regular rate, normal S1/S2, no murmurs Respiratory: No increased work of breathing.  Lungs clear to auscultation bilaterally.  No wheezes. Abdomen: soft, nontender, nondistended. Normal bowel sounds.  No appreciable masses  Extremities: warm, well perfused, cap refill < 2 sec.   Musculoskeletal: Normal muscle mass.  Normal strength Skin: warm, dry.  No rash or lesions. Neurologic:  alert and oriented, normal speech, no tremor    Labs: Results for orders placed or performed in visit on 10/07/19  Lipid panel  Result Value Ref Range   Cholesterol 294 (H) <170 mg/dL   HDL 400 >86 mg/dL   Triglycerides 761 (H) <90 mg/dL   LDL Cholesterol (Calc) 167 (H) <110 mg/dL (calc)   Total CHOL/HDL Ratio 2.9 <5.0 (calc)   Non-HDL Cholesterol (Calc) 192 (H) <120 mg/dL (calc)  Microalbumin / creatinine urine ratio  Result Value Ref Range   Creatinine, Urine 73 20 - 275 mg/dL   Microalb, Ur 1.1 mg/dL   Microalb Creat Ratio 15 <30 mcg/mg creat  TSH  Result Value Ref Range   TSH 1.91 mIU/L  T4, free  Result Value Ref Range   Free T4 1.0 0.8 - 1.4 ng/dL  POCT Glucose (Device for Home Use)  Result Value Ref Range   Glucose Fasting, POC     POC Glucose 210 (A) 70 - 99 mg/dl  POCT glycosylated hemoglobin (Hb A1C)  Result Value Ref Range   Hemoglobin A1C 7.2 (A) 4.0 - 5.6 %   HbA1c POC (<> result, manual entry)     HbA1c, POC (prediabetic range)     HbA1c, POC (controlled diabetic range)        Assessment/Plan: Amyjo is a 19 y.o. female with type 1 diabetes on MDI and CGM therapy. Having more variability in blood sugars recently due to snacking/stacking insulin. Her hemoglobin A1c is 7.2% which is slightly higher then ADA goal of <7%. Anxiety and depression improving on lexapro.   1-3. DM w/o complication type I, uncontrolled (HCC)/hyperglycemia/Insulin dose change  - 32 units of Lantus   - Novolog 120/30/5 plan  - Reviewed meter and CGM download. Discussed trends and patterns.  - Rotate injection sites to prevent scar tissue.  - bolus 15 minutes prior to eating to limit blood sugar spikes.  - Reviewed carb counting and importance of accurate carb counting.  - Discussed signs and symptoms of hypoglycemia. Always have glucose available.  - POCT glucose and hemoglobin A1c  - Reviewed growth chart.  - Discussed insulin pump options including Tslim and Omnipod.   4. Disordered eating - Follow up with Maralyn Sago (therapist)   - Discussed how binging and purging can effect diabetes.   5. Depression/Anxiety  - Lexapro 15 mg daily  - Close follow up with Adolescent medicine.   6. Mixed hyperlipidemia  - Take 1000 mg of fish oil daily     Follow-up:   3 months.   >45 spent today reviewing the medical chart, counseling the patient/family, and documenting today's visit.  When a patient is on insulin, intensive monitoring of blood glucose levels is necessary to avoid hyperglycemia and hypoglycemia. Severe hyperglycemia/hypoglycemia can lead to hospital admissions and be life threatening.     Gretchen Short,  FNP-C  Pediatric Specialist  [address]  [place], [address]  Tele: [phone]

## 2020-02-13 ENCOUNTER — Other Ambulatory Visit (INDEPENDENT_AMBULATORY_CARE_PROVIDER_SITE_OTHER): Payer: Self-pay | Admitting: Family

## 2020-02-13 ENCOUNTER — Encounter (INDEPENDENT_AMBULATORY_CARE_PROVIDER_SITE_OTHER): Payer: Self-pay

## 2020-02-13 DIAGNOSIS — E1065 Type 1 diabetes mellitus with hyperglycemia: Secondary | ICD-10-CM

## 2020-03-06 ENCOUNTER — Other Ambulatory Visit: Payer: Self-pay

## 2020-03-06 ENCOUNTER — Ambulatory Visit (INDEPENDENT_AMBULATORY_CARE_PROVIDER_SITE_OTHER): Payer: BC Managed Care – PPO | Admitting: Family

## 2020-03-06 ENCOUNTER — Encounter (INDEPENDENT_AMBULATORY_CARE_PROVIDER_SITE_OTHER): Payer: Self-pay | Admitting: Family

## 2020-03-06 VITALS — BP 114/70 | HR 88 | Wt 118.8 lb

## 2020-03-06 DIAGNOSIS — F4323 Adjustment disorder with mixed anxiety and depressed mood: Secondary | ICD-10-CM

## 2020-03-06 DIAGNOSIS — R7309 Other abnormal glucose: Secondary | ICD-10-CM | POA: Diagnosis not present

## 2020-03-06 DIAGNOSIS — R739 Hyperglycemia, unspecified: Secondary | ICD-10-CM | POA: Diagnosis not present

## 2020-03-06 DIAGNOSIS — F509 Eating disorder, unspecified: Secondary | ICD-10-CM

## 2020-03-06 DIAGNOSIS — E109 Type 1 diabetes mellitus without complications: Secondary | ICD-10-CM | POA: Diagnosis not present

## 2020-03-06 DIAGNOSIS — E782 Mixed hyperlipidemia: Secondary | ICD-10-CM

## 2020-03-06 LAB — POCT GLYCOSYLATED HEMOGLOBIN (HGB A1C): Hemoglobin A1C: 9 % — AB (ref 4.0–5.6)

## 2020-03-06 LAB — POCT GLUCOSE (DEVICE FOR HOME USE): POC Glucose: 113 mg/dl — AB (ref 70–99)

## 2020-03-06 NOTE — Progress Notes (Signed)
Pediatric Endocrinology Diabetes Consultation Follow-up Visit  Kelli Davis [birth year] [id number]  Chief Complaint: Follow-up type 1 diabetes   [name], FNP   HPI: Kelli Davis  is a 19 y.o. female presenting for follow-up of type 1 diabetes. she is accompanied to this visit by her mother and father.   1. Kelli Davis was diagnosed with type 1 diabetes at age 65. At that time she was hospitalized at [facility] center and was in DKA. She was in the ICU for 2 days. She was initially followed by Dr. Langston Masker in [place] but transferred to this clinic after Dr. Langston Masker retired. She has been admitted in DKA two additional times since diagnosis. She has been on pump therapy since age 29.  2. Since last visit to [facility] on 09/2019, she reports she has been well.   She started a job at [employer], she has been very busy. She is not in school at this time but is considering going to [employer]. She is mainly staying with her mom lately.   Using Dexcom CGM and MDI for diabetes management. She is trying to save up money for insulin pump. She feels like her blood sugars have been a little worse since starting her job. She is mainly giving her injections after she eats and reports she never knows how much she is going to eat. Having more hyperglycemia then hypoglycemia. When she gets home from work she is eating and snacking late at night.   She is following up with Adolescent medicine but has not been seen "in a while".  Taking 15 mg of lexapro per day. She feels like it is working well.   She has not been following up with her dietitian since starting work.   Has not been taking fish oil consistently.   Insulin regimen: 30 units of Lantus, Novolog 120/30/5 Hypoglycemia: Able to feel low blood sugars.  No glucagon needed recently.  Dexcom CGm Download    Med-alert ID: Not currently wearing. Injection sites: arms and hips  Annual labs due: 07/2020 Ophthalmology due: 2019. Discussed  importance of annual dilated eye exam.     3. ROS: Greater than 10 systems reviewed with pertinent positives listed in HPI, otherwise neg. Constitutional: Sleeping well. 8 lbs weight loss.  Eyes: No changes in vision, denies blurry vision. Wears glasses.  Ears/Nose/Mouth/Throat: No difficulty swallowing. No neck pain  Cardiovascular: No palpitations, denies tachycardia  Respiratory: No increased work of breathing, No SOB  Gastrointestinal: No constipation or diarrhea. No abdominal pain Genitourinary: No nocturia, no polyuria Endocrine: No polydipsia.  No hyperpigmentation Psychiatric: Normal affect.+ anxiety and depression. Currently on Lexapro.   Past Medical History:   Past Medical History:  Diagnosis Date  . ADD (attention deficit disorder)   . Febrile seizure ([facility])   . History of eye surgery   . Hypoglycemia associated with diabetes ([facility])   . Hypoglycemia associated with diabetes ([facility])   . Physical growth delay   . Type 1 diabetes mellitus not at goal [facility])     Medications:  Outpatient Encounter Medications as of 03/06/2020  Medication Sig  . ACCU-CHEK FASTCLIX LANCETS MISC Check sugar 10 x daily  . buPROPion (WELLBUTRIN XL) 300 MG 24 hr tablet TAKE 1 TABLET BY MOUTH EVERY DAY  . cloNIDine HCl (KAPVAY) 0.1 MG TB12 ER tablet TAKE 1 TABLET (0.1 MG TOTAL) BY MOUTH AT BEDTIME.  Marland Kitchen Continuous Blood Gluc Sensor (DEXCOM G6 SENSOR) MISC 1 APPLICATION BY DOES NOT APPLY ROUTE AS NEEDED.  Marland Kitchen  Continuous Blood Gluc Transmit (DEXCOM G6 TRANSMITTER) MISC 1 APPLICATION BY DOES NOT APPLY ROUTE CONTINUOUS AS NEEDED.  Marland Kitchen diphenhydrAMINE (BENADRYL) 12.5 MG chewable tablet Chew 12.5 mg by mouth 4 (four) times daily as needed for allergies.  Marland Kitchen escitalopram (LEXAPRO) 10 MG tablet TAKE 1 AND 1/2 TABLETS DAILY BY MOUTH  . fluticasone (FLONASE) 50 MCG/ACT nasal spray SPRAY 1 SPRAY INTO EACH NOSTRIL EVERY DAY  . glucose blood test strip CHECK SUGAR 6 X DAILY  . ibuprofen (ADVIL,MOTRIN) 200 MG tablet Take  400 mg by mouth every 6 (six) hours as needed for headache, mild pain or cramping.   . insulin aspart (NOVOLOG FLEXPEN) 100 UNIT/ML FlexPen Inject up to 50 units daily  . Insulin Pen Needle (BD PEN NEEDLE NANO U/F) 32G X 4 MM MISC Use with insulin pens 6x daily  . l-methylfolate-B6-B12 (METANX) 3-35-2 MG TABS tablet TAKE 1 TABLET BY MOUTH EVERY DAY  . LANTUS SOLOSTAR 100 UNIT/ML Solostar Pen USE UP TO 50 UNITS DAILY  . norgestimate-ethinyl estradiol (SPRINTEC 28) 0.25-35 MG-MCG tablet TAKE 1 TABLET DAILY. DISCARD PLACEBOS AND TAKE ACTIVE PILLS FOR CONTINUOUS CYCLING  . SUMAtriptan (IMITREX) 50 MG tablet 1 TAB EVERY 2HRS AS NEEDED MIGRAINE (NO MORE THAN 2 DOSES A DAY MAY REPEAT IN 2HRS IF PERSISTS/RECUR  . Glucagon (BAQSIMI ONE PACK) 3 MG/DOSE POWD Place 1 Dose into the nose as needed. (Patient not taking: Reported on 03/06/2020)  . [DISCONTINUED] cloNIDine HCl (KAPVAY) 0.1 MG TB12 ER tablet TAKE 1 TABLET (0.1 MG TOTAL) BY MOUTH AT BEDTIME.   No facility-administered encounter medications on file as of 03/06/2020.    Allergies: No Known Allergies  Surgical History: Past Surgical History:  Procedure Laterality Date  . EYE MUSCLE SURGERY      Family History:  Family History  Problem Relation Age of Onset  . Cancer Maternal Grandmother   . Hypertension Maternal Grandfather   . Depression Maternal Grandfather   . Diabetes Paternal Grandfather   . Anxiety disorder Father   . Depression Mother   . Anxiety disorder Sister      Social History: Lives with: Living with mother    Physical Exam:  Vitals:   03/06/20 0857  BP: 114/70  Pulse: 88  Weight: 118 lb 12.8 oz (53.9 kg)   BP 114/70   Pulse 88   Wt 118 lb 12.8 oz (53.9 kg)   BMI 23.60 kg/m  Body mass index: body mass index is 23.6 kg/m. Blood pressure percentiles are not available for patients who are 18 years or older.  Ht Readings from Last 3 Encounters:  10/07/19 4' 11.49" (1.511 m) (3 %, Z= -1.87)*  03/18/19 4'  11.65" (1.515 m) (4 %, Z= -1.80)*  09/03/18 4' 11.45" (1.51 m) (3 %, Z= -1.87)*   * Growth percentiles are based on CDC (Girls, 2-20 Years) data.   Wt Readings from Last 3 Encounters:  03/06/20 118 lb 12.8 oz (53.9 kg) (32 %, Z= -0.47)*  10/07/19 126 lb 12.8 oz (57.5 kg) (50 %, Z= 0.00)*  06/24/19 135 lb 12.8 oz (61.6 kg) (67 %, Z= 0.43)*   * Growth percentiles are based on CDC (Girls, 2-20 Years) data.   Physical Exam   General: Well developed, well nourished female in no acute distress.  Head: Normocephalic, atraumatic.   Eyes:  Pupils equal and round. EOMI.   Sclera white.  No eye drainage.   Ears/Nose/Mouth/Throat: Nares patent, no nasal drainage.  Normal dentition, mucous membranes moist.   Neck: supple,  no cervical lymphadenopathy, no thyromegaly Cardiovascular: regular rate, normal S1/S2, no murmurs Respiratory: No increased work of breathing.  Lungs clear to auscultation bilaterally.  No wheezes. Abdomen: soft, nontender, nondistended. Normal bowel sounds.  No appreciable masses  Extremities: warm, well perfused, cap refill < 2 sec.   Musculoskeletal: Normal muscle mass.  Normal strength Skin: warm, dry.  No rash or lesions. Neurologic: alert and oriented, normal speech, no tremor    Labs: Results for orders placed or performed in visit on 03/06/20  POCT Glucose (Device for Home Use)  Result Value Ref Range   Glucose Fasting, POC     POC Glucose 113 (A) 70 - 99 mg/dl  POCT glycosylated hemoglobin (Hb A1C)  Result Value Ref Range   Hemoglobin A1C 9.0 (A) 4.0 - 5.6 %   HbA1c POC (<> result, manual entry)     HbA1c, POC (prediabetic range)     HbA1c, POC (controlled diabetic range)       Assessment/Plan: Kelli Davis is a 19 y.o. female with type 1 diabetes on MDI and CGM therapy. More variability with glucose values due to starting new job, has made improvements during last month. Hemoglobin A1c is 9.1% which is higher then ADA goal of <7%. She has lost 8 lbs,  encouraged close follow up with dietitian and Adolescent med.   1-3. DM w/o complication type I, uncontrolled (HCC)/hyperglycemia/Insulin dose change  - 30 units of Lantus   - Novolog 120/30/5 plan  - Reviewed insulin pump and CGM download. Discussed trends and patterns.  - Rotate pump sites to prevent scar tissue.  - bolus 15 minutes prior to eating to limit blood sugar spikes.  - Reviewed carb counting and importance of accurate carb counting.  - Discussed signs and symptoms of hypoglycemia. Always have glucose available.  - POCT glucose and hemoglobin A1c  - Reviewed growth chart.  - Discussed insulin pump therapy including closed loop insulin pump in detail with patient.   4. Disordered eating - Follow up with Maralyn Sago (therapist) . Encouraged follow up  - Discussed how binging and purging can effect diabetes.   5. Depression/Anxiety  - Lexapro 15 mg daily  - Close follow up with Adolescent medicine. Discussed recent weight loss which she attributes to new job.   6. Mixed hyperlipidemia  - Take 2000 mg per day  - Low cholesterol diet  - Fasting lipid panel tomorrow.   7. Adjustment Disorder  - Discussed concerns and barriers to care.  - Answered questions.   Follow-up:   3 months.   >.45 spent today reviewing the medical chart, counseling the patient/family, and documenting today's visit.   When a patient is on insulin, intensive monitoring of blood glucose levels is necessary to avoid hyperglycemia and hypoglycemia. Severe hyperglycemia/hypoglycemia can lead to hospital admissions and be life threatening.     Gretchen Short,  FNP-C  Pediatric Specialist  [address]  [place], [address]  Tele: [phone]

## 2020-03-06 NOTE — Patient Instructions (Addendum)
-  Always have fast sugar with you in case of low blood sugar (glucose tabs, regular juice or soda, candy) -Always wear your ID that states you have diabetes -Always bring your meter to your visit -Call/Email if you want to review blood sugars  Take 2000 units of fish oil.   Fasting labs at next visit.

## 2020-03-08 ENCOUNTER — Other Ambulatory Visit: Payer: Self-pay | Admitting: Family

## 2020-03-25 ENCOUNTER — Other Ambulatory Visit (INDEPENDENT_AMBULATORY_CARE_PROVIDER_SITE_OTHER): Payer: Self-pay | Admitting: Family

## 2020-05-04 ENCOUNTER — Encounter: Payer: Self-pay | Admitting: Pediatrics

## 2020-05-04 ENCOUNTER — Ambulatory Visit: Payer: BC Managed Care – PPO | Admitting: Pediatrics

## 2020-05-04 VITALS — BP 135/93 | HR 61 | Ht 59.57 in | Wt 118.8 lb

## 2020-05-04 DIAGNOSIS — F4323 Adjustment disorder with mixed anxiety and depressed mood: Secondary | ICD-10-CM | POA: Diagnosis not present

## 2020-05-04 DIAGNOSIS — F5001 Anorexia nervosa, restricting type: Secondary | ICD-10-CM

## 2020-05-04 DIAGNOSIS — E559 Vitamin D deficiency, unspecified: Secondary | ICD-10-CM | POA: Diagnosis not present

## 2020-05-04 DIAGNOSIS — E1065 Type 1 diabetes mellitus with hyperglycemia: Secondary | ICD-10-CM

## 2020-05-04 DIAGNOSIS — F411 Generalized anxiety disorder: Secondary | ICD-10-CM | POA: Diagnosis not present

## 2020-05-04 DIAGNOSIS — F902 Attention-deficit hyperactivity disorder, combined type: Secondary | ICD-10-CM | POA: Diagnosis not present

## 2020-05-04 DIAGNOSIS — Z3041 Encounter for surveillance of contraceptive pills: Secondary | ICD-10-CM

## 2020-05-04 MED ORDER — NORGESTIMATE-ETH ESTRADIOL 0.25-35 MG-MCG PO TABS: ORAL_TABLET | ORAL | 4 refills | Status: DC

## 2020-05-04 MED ORDER — CYCLOBENZAPRINE HCL 10 MG PO TABS: ORAL_TABLET | ORAL | 0 refills | Status: DC

## 2020-05-04 MED ORDER — ESCITALOPRAM OXALATE 10 MG PO TABS: ORAL_TABLET | ORAL | 1 refills | Status: DC

## 2020-05-04 MED ORDER — BUPROPION HCL ER (XL) 300 MG PO TB24: 300.0000 mg | ORAL_TABLET | Freq: Every day | ORAL | 2 refills | Status: DC

## 2020-05-04 NOTE — Progress Notes (Signed)
History was provided by the patient.  Kelli Davis is a 19 y.o. female who is here for anxiety, depression, ADHD, weight loss.  Kelli Skill, FNP   HPI:  Pt reports she got a job and has been working a lot of hours- [employer]. She is moving houses with mom to [facility].   Still feels like she binge eating a lot but is losing weight. She was previously laying in bed eating a lot, but now with working she is moving around a lot more than she was previously. She is trying to eat a little healthier for her diabetes as well. Denies intentional restricting. Last purging was 1-2 years ago. Some pre-meal nausea even when she wants to eat. Does take tums sometimes, also fish oil.   Anxiety 4/10. Depression 5/10. Sometimes is worse and she doesn't want to get out of bed.   Sometimes sharp chest pain in the middle of her chest. Sometimes SOB. Usually resolves on its own.   Working on getting her license- [employer] issues with COVID.   Just started being sexually active with new boyfriend and would like IUD.   PHQ-SADS Last 3 Score only 05/05/2020 06/12/2019 06/06/2018  PHQ-15 Score 7 7 17   Total GAD-7 Score 6 18 17   PHQ-9 Total Score 5 17 15      No LMP recorded. (Menstrual status: Oral contraceptives).  Review of Systems  Constitutional: Positive for weight loss. Negative for malaise/fatigue.  Eyes: Negative for double vision.  Respiratory: Negative for shortness of breath.   Cardiovascular: Positive for chest pain. Negative for palpitations.  Gastrointestinal: Positive for heartburn and nausea. Negative for abdominal pain, constipation, diarrhea and vomiting.  Genitourinary: Negative for dysuria.  Musculoskeletal: Negative for joint pain and myalgias.  Skin: Negative for rash.  Neurological: Negative for dizziness and headaches.  Endo/Heme/Allergies: Does not bruise/bleed easily.  Psychiatric/Behavioral: Positive for depression. Negative for suicidal ideas. The patient is  nervous/anxious and has insomnia.     Patient Active Problem List   Diagnosis Date Noted   Insulin dose changed (HCC) 09/03/2018   Mixed hyperlipidemia 09/03/2018   Generalized anxiety disorder 09/04/2017   Oral contraceptive pill surveillance 09/04/2017   Eating disorder 05/16/2016   Insomnia 04/12/2016   Adjustment disorder with mixed anxiety and depressed mood 10/08/2015   ADHD (attention deficit hyperactivity disorder), combined type 05/20/2015   Maladaptive health behaviors affecting medical condition 10/17/2013   Hyperglycemia 07/11/2011   Hypoglycemia associated with diabetes (HCC)     Current Outpatient Medications on File Prior to Visit  Medication Sig Dispense Refill   ACCU-CHEK FASTCLIX LANCETS MISC Check sugar 10 x daily 300 each 3   buPROPion (WELLBUTRIN XL) 300 MG 24 hr tablet TAKE 1 TABLET BY MOUTH EVERY DAY 90 tablet 2   cloNIDine HCl (KAPVAY) 0.1 MG TB12 ER tablet TAKE 1 TABLET (0.1 MG TOTAL) BY MOUTH AT BEDTIME. 90 tablet 1   Continuous Blood Gluc Sensor (DEXCOM G6 SENSOR) MISC 1 APPLICATION BY DOES NOT APPLY ROUTE AS NEEDED. 3 each 5   Continuous Blood Gluc Transmit (DEXCOM G6 TRANSMITTER) MISC 1 APPLICATION BY DOES NOT APPLY ROUTE CONTINUOUS AS NEEDED. 1 each 3   diphenhydrAMINE (BENADRYL) 12.5 MG chewable tablet Chew 12.5 mg by mouth 4 (four) times daily as needed for allergies.     escitalopram (LEXAPRO) 10 MG tablet TAKE 1 AND 1/2 TABLETS DAILY BY MOUTH 135 tablet 1   fluticasone (FLONASE) 50 MCG/ACT nasal spray SPRAY 1 SPRAY INTO EACH NOSTRIL EVERY DAY  Glucagon (BAQSIMI ONE PACK) 3 MG/DOSE POWD Place 1 Dose into the nose as needed. (Patient not taking: Reported on 03/06/2020) 2 each 1   glucose blood test strip CHECK SUGAR 6 X DAILY 600 each 5   ibuprofen (ADVIL,MOTRIN) 200 MG tablet Take 400 mg by mouth every 6 (six) hours as needed for headache, mild pain or cramping.      insulin aspart (NOVOLOG FLEXPEN) 100 UNIT/ML FlexPen Inject up  to 50 units daily 15 mL 5   Insulin Pen Needle (BD PEN NEEDLE NANO U/F) 32G X 4 MM MISC Use with insulin pens 6x daily 200 each 6   l-methylfolate-B6-B12 (METANX) 3-35-2 MG TABS tablet TAKE 1 TABLET BY MOUTH EVERY DAY 90 tablet 0   LANTUS SOLOSTAR 100 UNIT/ML Solostar Pen USE UP TO 50 UNITS DAILY 15 mL 1   norgestimate-ethinyl estradiol (SPRINTEC 28) 0.25-35 MG-MCG tablet TAKE 1 TABLET DAILY. DISCARD PLACEBOS AND TAKE ACTIVE PILLS FOR CONTINUOUS CYCLING 112 tablet 4   SUMAtriptan (IMITREX) 50 MG tablet 1 TAB EVERY 2HRS AS NEEDED MIGRAINE (NO MORE THAN 2 DOSES A DAY MAY REPEAT IN 2HRS IF PERSISTS/RECUR 10 tablet 0   [DISCONTINUED] cloNIDine HCl (KAPVAY) 0.1 MG TB12 ER tablet TAKE 1 TABLET (0.1 MG TOTAL) BY MOUTH AT BEDTIME. 90 tablet 1   No current facility-administered medications on file prior to visit.    No Known Allergies  Social History: Confidentiality was discussed with the patient and if applicable, with caregiver as well. Tobacco: no Secondhand smoke exposure? yes  Drugs/EtOH: no Sexually active? yes - one female partner  Safety: safe at home and to self  Pregnancy Prevention: ocp  Physical Exam:    Vitals:   05/04/20 1340  BP: 105/67  Pulse: 60  Weight: 118 lb 12.8 oz (53.9 kg)  Height: 4' 11.57" (1.513 m)    Blood pressure percentiles are not available for patients who are 18 years or older.  Physical Exam Vitals and nursing note reviewed.  Constitutional:      General: She is not in acute distress.    Appearance: She is well-developed.  Neck:     Thyroid: No thyromegaly.  Cardiovascular:     Rate and Rhythm: Normal rate and regular rhythm.     Heart sounds: No murmur heard.   Pulmonary:     Breath sounds: Normal breath sounds.  Abdominal:     Palpations: Abdomen is soft. There is no mass.     Tenderness: There is no abdominal tenderness. There is no guarding.  Musculoskeletal:     Right lower leg: No edema.     Left lower leg: No edema.   Lymphadenopathy:     Cervical: No cervical adenopathy.  Skin:    General: Skin is warm.     Findings: No rash.  Neurological:     Mental Status: She is alert.     Comments: No tremor  Psychiatric:        Mood and Affect: Mood normal.     Assessment/Plan: 1. Uncontrolled type 1 diabetes mellitus with hyperglycemia (HCC) Labs today for endo appt next month. She is overall doing better and still hopes to get pump.  - Lipid panel - Comprehensive metabolic panel - TSH - T4, free - Hemoglobin A1c  2. Generalized anxiety disorder Continue lexapro and wellbutrin. Doing well. PHQSADs significantly improved.  - buPROPion (WELLBUTRIN XL) 300 MG 24 hr tablet; Take 1 tablet (300 mg total) by mouth daily.  Dispense: 90 tablet; Refill: 2 - escitalopram (  LEXAPRO) 10 MG tablet; TAKE 1 AND 1/2 TABLETS DAILY BY MOUTH  Dispense: 135 tablet; Refill: 1  3. Adjustment disorder with mixed anxiety and depressed mood As above.  - buPROPion (WELLBUTRIN XL) 300 MG 24 hr tablet; Take 1 tablet (300 mg total) by mouth daily.  Dispense: 90 tablet; Refill: 2  4. ADHD (attention deficit hyperactivity disorder), combined type Continue wellbutrin. Doing very well.  - buPROPion (WELLBUTRIN XL) 300 MG 24 hr tablet; Take 1 tablet (300 mg total) by mouth daily.  Dispense: 90 tablet; Refill: 2  5. Anorexia nervosa, restricting type Has had some significant weight loss since 2020 that she relates to working more, being less sedentary and doing less binge eating. It is certainly possible- we will monitor closely. Orthostatic vitals reassuring today.   6. Oral contraceptive pill surveillance Continue- will place IUD in 2 weeks.  - norgestimate-ethinyl estradiol (SPRINTEC 28) 0.25-35 MG-MCG tablet; TAKE 1 TABLET DAILY. DISCARD PLACEBOS AND TAKE ACTIVE PILLS FOR CONTINUOUS CYCLING  Dispense: 112 tablet; Refill: 4  7. Vitamin D deficiency Repeat- supplement as needed.  - VITAMIN D 25 Hydroxy (Vit-D Deficiency,  Fractures)  Return 10/4 for IUD placement with Huel Cote, FNP

## 2020-05-04 NOTE — Patient Instructions (Addendum)
Make sure you are getting 3 good meals daily Continue same medications  Labs today- [name] will follow up with them next month   Before IUD placement- take flexeril 10 mg 4 hours before you come with 800 mg of ibuprofen 1 hour before you come

## 2020-05-05 DIAGNOSIS — E559 Vitamin D deficiency, unspecified: Secondary | ICD-10-CM | POA: Insufficient documentation

## 2020-05-05 LAB — COMPREHENSIVE METABOLIC PANEL
AG Ratio: 1.2 (calc) (ref 1.0–2.5)
ALT: 16 U/L (ref 5–32)
AST: 18 U/L (ref 12–32)
Albumin: 4.2 g/dL (ref 3.6–5.1)
Alkaline phosphatase (APISO): 47 U/L (ref 36–128)
BUN: 8 mg/dL (ref 7–20)
CO2: 24 mmol/L (ref 20–32)
Calcium: 9.5 mg/dL (ref 8.9–10.4)
Chloride: 99 mmol/L (ref 98–110)
Creat: 0.74 mg/dL (ref 0.50–1.00)
Globulin: 3.4 g/dL (calc) (ref 2.0–3.8)
Glucose, Bld: 223 mg/dL — ABNORMAL HIGH (ref 65–99)
Potassium: 4.5 mmol/L (ref 3.8–5.1)
Sodium: 135 mmol/L (ref 135–146)
Total Bilirubin: 0.5 mg/dL (ref 0.2–1.1)
Total Protein: 7.6 g/dL (ref 6.3–8.2)

## 2020-05-05 LAB — LIPID PANEL
Cholesterol: 243 mg/dL — ABNORMAL HIGH (ref <170)
HDL: 97 mg/dL (ref >45)
LDL Cholesterol (Calc): 126 mg/dL (calc) — ABNORMAL HIGH (ref <110)
Non-HDL Cholesterol (Calc): 146 mg/dL (calc) — ABNORMAL HIGH (ref <120)
Total CHOL/HDL Ratio: 2.5 (calc) (ref <5.0)
Triglycerides: 96 mg/dL — ABNORMAL HIGH (ref <90)

## 2020-05-05 LAB — TSH: TSH: 1.05 mIU/L

## 2020-05-05 LAB — HEMOGLOBIN A1C
Hgb A1c MFr Bld: 8.2 % of total Hgb — ABNORMAL HIGH (ref <5.7)
Mean Plasma Glucose: 189 (calc)
eAG (mmol/L): 10.4 (calc)

## 2020-05-05 LAB — T4, FREE: Free T4: 1 ng/dL (ref 0.8–1.4)

## 2020-05-05 LAB — VITAMIN D 25 HYDROXY (VIT D DEFICIENCY, FRACTURES): Vit D, 25-Hydroxy: 26 ng/mL — ABNORMAL LOW (ref 30–100)

## 2020-05-18 ENCOUNTER — Encounter: Payer: Self-pay | Admitting: Family

## 2020-05-18 ENCOUNTER — Ambulatory Visit: Payer: BC Managed Care – PPO | Admitting: Family

## 2020-05-18 ENCOUNTER — Other Ambulatory Visit: Payer: Self-pay

## 2020-05-18 VITALS — BP 129/89 | HR 89 | Ht 59.15 in | Wt 119.4 lb

## 2020-05-18 DIAGNOSIS — Z113 Encounter for screening for infections with a predominantly sexual mode of transmission: Secondary | ICD-10-CM | POA: Diagnosis not present

## 2020-05-18 DIAGNOSIS — N949 Unspecified condition associated with female genital organs and menstrual cycle: Secondary | ICD-10-CM | POA: Diagnosis not present

## 2020-05-18 DIAGNOSIS — R300 Dysuria: Secondary | ICD-10-CM

## 2020-05-18 DIAGNOSIS — Z3202 Encounter for pregnancy test, result negative: Secondary | ICD-10-CM | POA: Diagnosis not present

## 2020-05-18 DIAGNOSIS — N898 Other specified noninflammatory disorders of vagina: Secondary | ICD-10-CM | POA: Diagnosis not present

## 2020-05-18 LAB — POCT URINALYSIS DIPSTICK
Bilirubin, UA: NEGATIVE
Blood, UA: NEGATIVE
Glucose, UA: POSITIVE — AB
Ketones, UA: NEGATIVE
Leukocytes, UA: NEGATIVE
Nitrite, UA: NEGATIVE
Protein, UA: NEGATIVE
Spec Grav, UA: 1.015 (ref 1.010–1.025)
Urobilinogen, UA: NEGATIVE E.U./dL — AB
pH, UA: 7 (ref 5.0–8.0)

## 2020-05-18 LAB — POCT RAPID HIV: Rapid HIV, POC: NEGATIVE

## 2020-05-18 LAB — POCT URINE PREGNANCY: Preg Test, Ur: NEGATIVE

## 2020-05-18 MED ORDER — CEFTRIAXONE SODIUM 500 MG IJ SOLR
500.0000 mg | Freq: Once | INTRAMUSCULAR | Status: AC
  Administered 2020-05-18: 500 mg via INTRAMUSCULAR

## 2020-05-18 MED ORDER — AZITHROMYCIN 500 MG PO TABS
1000.0000 mg | ORAL_TABLET | Freq: Once | ORAL | Status: AC
  Administered 2020-05-18: 1000 mg via ORAL

## 2020-05-18 NOTE — Progress Notes (Signed)
History was provided by the patient.  Kelli Davis is a 19 y.o. female who is here for IUD insertion.   PCP confirmed? Yes.    [name], FNP  HPI:   -presents for IUD insertion, took Flexeril prior.  -female/female partners, with current female partner x 2 months -about 3 months of pain with intercourse/penetration  -some spotting, discharge changes -sometimes condom use   -dysuria x a few days -no fever, chills, n/v   Review of Systems  Constitutional: Negative for chills, fever and malaise/fatigue.  HENT: Negative for sore throat.   Gastrointestinal: Positive for abdominal pain. Negative for nausea and vomiting.  Genitourinary: Positive for dysuria and urgency. Negative for hematuria.  Musculoskeletal: Negative for myalgias.  Skin: Negative for rash.  Psychiatric/Behavioral: The patient is nervous/anxious.       Patient Active Problem List   Diagnosis Date Noted  . Vitamin D deficiency 05/05/2020  . Insulin dose changed (HCC) 09/03/2018  . Mixed hyperlipidemia 09/03/2018  . Generalized anxiety disorder 09/04/2017  . Oral contraceptive pill surveillance 09/04/2017  . Eating disorder 05/16/2016  . Insomnia 04/12/2016  . Adjustment disorder with mixed anxiety and depressed mood 10/08/2015  . ADHD (attention deficit hyperactivity disorder), combined type 05/20/2015  . Maladaptive health behaviors affecting medical condition 10/17/2013  . Hyperglycemia 07/11/2011  . Hypoglycemia associated with diabetes (HCC)   . Uncontrolled type 1 diabetes mellitus with hyperglycemia (HCC) 12/13/2010    Current Outpatient Medications on File Prior to Visit  Medication Sig Dispense Refill  . ACCU-CHEK FASTCLIX LANCETS MISC Check sugar 10 x daily 300 each 3  . buPROPion (WELLBUTRIN XL) 300 MG 24 hr tablet Take 1 tablet (300 mg total) by mouth daily. 90 tablet 2  . cloNIDine HCl (KAPVAY) 0.1 MG TB12 ER tablet TAKE 1 TABLET (0.1 MG TOTAL) BY MOUTH AT BEDTIME. 90 tablet 1  .  Continuous Blood Gluc Sensor (DEXCOM G6 SENSOR) MISC 1 APPLICATION BY DOES NOT APPLY ROUTE AS NEEDED. 3 each 5  . Continuous Blood Gluc Transmit (DEXCOM G6 TRANSMITTER) MISC 1 APPLICATION BY DOES NOT APPLY ROUTE CONTINUOUS AS NEEDED. 1 each 3  . cyclobenzaprine (FLEXERIL) 10 MG tablet Take 1 tablet 4 hours before arrival. Then, take 1 tablet after procedure if needed for cramping 2 tablet 0  . diphenhydrAMINE (BENADRYL) 12.5 MG chewable tablet Chew 12.5 mg by mouth 4 (four) times daily as needed for allergies.    Marland Kitchen escitalopram (LEXAPRO) 10 MG tablet TAKE 1 AND 1/2 TABLETS DAILY BY MOUTH 135 tablet 1  . fluticasone (FLONASE) 50 MCG/ACT nasal spray SPRAY 1 SPRAY INTO EACH NOSTRIL EVERY DAY    . glucose blood test strip CHECK SUGAR 6 X DAILY 600 each 5  . ibuprofen (ADVIL,MOTRIN) 200 MG tablet Take 400 mg by mouth every 6 (six) hours as needed for headache, mild pain or cramping.     . insulin aspart (NOVOLOG FLEXPEN) 100 UNIT/ML FlexPen Inject up to 50 units daily 15 mL 5  . Insulin Pen Needle (BD PEN NEEDLE NANO U/F) 32G X 4 MM MISC Use with insulin pens 6x daily 200 each 6  . l-methylfolate-B6-B12 (METANX) 3-35-2 MG TABS tablet TAKE 1 TABLET BY MOUTH EVERY DAY 90 tablet 0  . LANTUS SOLOSTAR 100 UNIT/ML Solostar Pen USE UP TO 50 UNITS DAILY 15 mL 1  . norgestimate-ethinyl estradiol (SPRINTEC 28) 0.25-35 MG-MCG tablet TAKE 1 TABLET DAILY. DISCARD PLACEBOS AND TAKE ACTIVE PILLS FOR CONTINUOUS CYCLING 112 tablet 4  . SUMAtriptan (IMITREX) 50 MG  tablet 1 TAB EVERY 2HRS AS NEEDED MIGRAINE (NO MORE THAN 2 DOSES A DAY MAY REPEAT IN 2HRS IF PERSISTS/RECUR 10 tablet 0  . Glucagon (BAQSIMI ONE PACK) 3 MG/DOSE POWD Place 1 Dose into the nose as needed. (Patient not taking: Reported on 03/06/2020) 2 each 1  . [DISCONTINUED] cloNIDine HCl (KAPVAY) 0.1 MG TB12 ER tablet TAKE 1 TABLET (0.1 MG TOTAL) BY MOUTH AT BEDTIME. 90 tablet 1   No current facility-administered medications on file prior to visit.    No  Known Allergies  Physical Exam:    Vitals:   05/18/20 1032  BP: 129/89  Pulse: 89  Weight: 119 lb 6.4 oz (54.2 kg)  Height: 4' 11.15" (1.502 m)   Wt Readings from Last 3 Encounters:  05/18/20 119 lb 6.4 oz (54.2 kg) (33 %, Z= -0.45)*  05/04/20 118 lb 12.8 oz (53.9 kg) (32 %, Z= -0.48)*  03/06/20 118 lb 12.8 oz (53.9 kg) (32 %, Z= -0.47)*   * Growth percentiles are based on CDC (Girls, 2-20 Years) data.     Blood pressure percentiles are not available for patients who are 18 years or older. No LMP recorded. (Menstrual status: Oral contraceptives).  Physical Exam Vitals reviewed. Exam conducted with a chaperone present.  Constitutional:      Appearance: Normal appearance.  HENT:     Nose: Nose normal.     Mouth/Throat:     Mouth: Mucous membranes are moist.  Eyes:     General: No scleral icterus.    Extraocular Movements: Extraocular movements intact.     Pupils: Pupils are equal, round, and reactive to light.  Cardiovascular:     Rate and Rhythm: Normal rate and regular rhythm.     Heart sounds: No murmur heard.   Pulmonary:     Effort: Pulmonary effort is normal.  Abdominal:     Tenderness: There is abdominal tenderness in the suprapubic area.  Genitourinary:    General: Normal vulva.     Tanner stage (genital): 5.     Vagina: Vaginal discharge present.     Cervix: Cervical motion tenderness and erythema present.     Uterus: Tender.      Adnexa: Right adnexa normal and left adnexa normal.     Comments: Thick purulent blood-tinged discharge from os Musculoskeletal:        General: No swelling. Normal range of motion.     Cervical back: Normal range of motion.  Lymphadenopathy:     Cervical: No cervical adenopathy.  Skin:    General: Skin is warm and dry.     Findings: No rash.  Neurological:     General: No focal deficit present.     Mental Status: She is alert.  Psychiatric:        Mood and Affect: Mood is anxious.    Assessment/Plan: 1. CMT  (cervical motion tenderness) 2. Vaginal discharge 3. Dysuria 4. Routine screening for STI (sexually transmitted infection) 5. Pregnancy examination or test, negative result - azithromycin (ZITHROMAX) tablet 1,000 mg - cefTRIAXone (ROCEPHIN) injection 500 mg - WET PREP BY MOLECULAR PROBE - Urinalysis, dipstick only - POCT urinalysis dipstick - POCT Rapid HIV - C. trachomatis/N. gonorrhoeae RNA - POCT urine pregnancy  19 yo A/I female presents for IUD insertion; PMH significant for T1DM; bimanual exam reveals CMT, blood-tinged discharge from os; will treat empirically for PID; screenings today; reviewed with patient reasons for not proceeding with IUD insertion today. Urine dip negative for nitrites, leuks. Positive for glucose,  however consistent with her baseline and negative for ketones.

## 2020-05-19 LAB — WET PREP BY MOLECULAR PROBE
Candida species: NOT DETECTED
Gardnerella vaginalis: NOT DETECTED
MICRO NUMBER:: 11027108
SPECIMEN QUALITY:: ADEQUATE
Trichomonas vaginosis: NOT DETECTED

## 2020-05-19 LAB — C. TRACHOMATIS/N. GONORRHOEAE RNA
C. trachomatis RNA, TMA: NOT DETECTED
N. gonorrhoeae RNA, TMA: NOT DETECTED

## 2020-05-22 ENCOUNTER — Encounter: Payer: Self-pay | Admitting: Family

## 2020-06-08 ENCOUNTER — Other Ambulatory Visit: Payer: Self-pay

## 2020-06-08 ENCOUNTER — Encounter (INDEPENDENT_AMBULATORY_CARE_PROVIDER_SITE_OTHER): Payer: Self-pay | Admitting: Family

## 2020-06-08 ENCOUNTER — Ambulatory Visit (INDEPENDENT_AMBULATORY_CARE_PROVIDER_SITE_OTHER): Payer: BC Managed Care – PPO | Admitting: Family

## 2020-06-08 VITALS — BP 120/80 | HR 94 | Ht 60.0 in | Wt 120.0 lb

## 2020-06-08 DIAGNOSIS — R7309 Other abnormal glucose: Secondary | ICD-10-CM | POA: Diagnosis not present

## 2020-06-08 DIAGNOSIS — E1065 Type 1 diabetes mellitus with hyperglycemia: Secondary | ICD-10-CM

## 2020-06-08 DIAGNOSIS — Z794 Long term (current) use of insulin: Secondary | ICD-10-CM

## 2020-06-08 DIAGNOSIS — E109 Type 1 diabetes mellitus without complications: Secondary | ICD-10-CM

## 2020-06-08 DIAGNOSIS — E782 Mixed hyperlipidemia: Secondary | ICD-10-CM | POA: Diagnosis not present

## 2020-06-08 DIAGNOSIS — R739 Hyperglycemia, unspecified: Secondary | ICD-10-CM

## 2020-06-08 DIAGNOSIS — F4323 Adjustment disorder with mixed anxiety and depressed mood: Secondary | ICD-10-CM

## 2020-06-08 LAB — POCT GLUCOSE (DEVICE FOR HOME USE): POC Glucose: 98 mg/dl (ref 70–99)

## 2020-06-08 NOTE — Progress Notes (Signed)
Pediatric Endocrinology Diabetes Consultation Follow-up Visit  Kelli Davis [birth year] [id number]  Chief Complaint: Follow-up type 1 diabetes   Kelli Skill, FNP   HPI: Kelli Davis  is a 19 y.o. female presenting for follow-up of type 1 diabetes. she is accompanied to this visit by her mother and father.   1. Avyn was diagnosed with type 1 diabetes at age 25. At that time she was hospitalized at [facility] center and was in DKA. She was in the ICU for 2 days. She was initially followed by Dr. Langston Masker in [place] but transferred to this clinic after Dr. Langston Masker retired. She has been admitted in DKA two additional times since diagnosis. She has been on pump therapy since age 3.  2. Since last visit to [facility] on 02/2020, she reports she has been well.   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 She has applied for residential program at [facility]. She is living with mom and going to see dad about once every weekend.   She reports her depression and anxiety are "a lot better" especially when she is staying busy. Feels like Wellbutrin has been very healthy. Eating has been "Ok" but hard to plan meals because she is working a lot.   She is wearing Dexcom. Using Lantus and Novolog for glucose management. Rarely missing any injections. She is trying to give Novolog before eating but finds it harder to do.   Concerns:  - Fluctuations in glucose levels. Especially at work.  - Overcorrecting when low    Taking 1000 mg of fish oil dialy.   Insulin regimen: 32 units of Lantus, Novolog 120/30/5 Hypoglycemia: Able to feel low blood sugars.  No glucagon needed recently.  Dexcom CGm Download   Med-alert ID: Not currently wearing. Injection sites: arms and hips  Annual labs due: 04/2021 Ophthalmology due: 2019. Discussed importance of annual dilated eye exam.     3. ROS: Greater than 10 systems reviewed with pertinent positives listed in HPI,  otherwise neg. Constitutional: Sleeping well. Weight stable.  Eyes: No changes in vision, denies blurry vision. Wears glasses.  Ears/Nose/Mouth/Throat: No difficulty swallowing. No neck pain  Cardiovascular: No palpitations, denies tachycardia  Respiratory: No increased work of breathing, No SOB  Gastrointestinal: No constipation or diarrhea. No abdominal pain Genitourinary: No nocturia, no polyuria Endocrine: No polydipsia.  No hyperpigmentation Psychiatric: Normal affect.+ anxiety and depression. Currently on Lexapro.   Past Medical History:   Past Medical History:  Diagnosis Date  . ADD (attention deficit disorder)   . Febrile seizure (HCC)   . History of eye surgery   . Hypoglycemia associated with diabetes (HCC)   . Hypoglycemia associated with diabetes (HCC)   . Physical growth delay   . Type 1 diabetes mellitus not at goal [facility])     Medications:  Outpatient Encounter Medications as of 06/08/2020  Medication Sig  . buPROPion (WELLBUTRIN XL) 300 MG 24 hr tablet Take 1 tablet (300 mg total) by mouth daily.  . Continuous Blood Gluc Transmit (DEXCOM G6 TRANSMITTER) MISC 1 APPLICATION BY DOES NOT APPLY ROUTE CONTINUOUS AS NEEDED.  Marland Kitchen cyclobenzaprine (FLEXERIL) 10 MG tablet Take 1 tablet 4 hours before arrival. Then, take 1 tablet after procedure if needed for cramping  . escitalopram (LEXAPRO) 10 MG tablet TAKE 1 AND 1/2 TABLETS DAILY BY MOUTH  . fluticasone (FLONASE) 50 MCG/ACT nasal spray SPRAY 1 SPRAY INTO EACH NOSTRIL EVERY DAY  . insulin aspart (NOVOLOG FLEXPEN)  100 UNIT/ML FlexPen Inject up to 50 units daily  . LANTUS SOLOSTAR 100 UNIT/ML Solostar Pen USE UP TO 50 UNITS DAILY  . norgestimate-ethinyl estradiol (SPRINTEC 28) 0.25-35 MG-MCG tablet TAKE 1 TABLET DAILY. DISCARD PLACEBOS AND TAKE ACTIVE PILLS FOR CONTINUOUS CYCLING  . SUMAtriptan (IMITREX) 50 MG tablet 1 TAB EVERY 2HRS AS NEEDED MIGRAINE (NO MORE THAN 2 DOSES A DAY MAY REPEAT IN 2HRS IF PERSISTS/RECUR  . ACCU-CHEK  FASTCLIX LANCETS MISC Check sugar 10 x daily (Patient not taking: Reported on 06/08/2020)  . cloNIDine HCl (KAPVAY) 0.1 MG TB12 ER tablet TAKE 1 TABLET (0.1 MG TOTAL) BY MOUTH AT BEDTIME. (Patient not taking: Reported on 06/08/2020)  . Continuous Blood Gluc Sensor (DEXCOM G6 SENSOR) MISC 1 APPLICATION BY DOES NOT APPLY ROUTE AS NEEDED. (Patient not taking: Reported on 06/08/2020)  . diphenhydrAMINE (BENADRYL) 12.5 MG chewable tablet Chew 12.5 mg by mouth 4 (four) times daily as needed for allergies. (Patient not taking: Reported on 06/08/2020)  . Glucagon (BAQSIMI ONE PACK) 3 MG/DOSE POWD Place 1 Dose into the nose as needed. (Patient not taking: Reported on 03/06/2020)  . glucose blood test strip CHECK SUGAR 6 X DAILY (Patient not taking: Reported on 06/08/2020)  . ibuprofen (ADVIL,MOTRIN) 200 MG tablet Take 400 mg by mouth every 6 (six) hours as needed for headache, mild pain or cramping.  (Patient not taking: Reported on 06/08/2020)  . Insulin Pen Needle (BD PEN NEEDLE NANO U/F) 32G X 4 MM MISC Use with insulin pens 6x daily (Patient not taking: Reported on 06/08/2020)  . l-methylfolate-B6-B12 (METANX) 3-35-2 MG TABS tablet TAKE 1 TABLET BY MOUTH EVERY DAY (Patient not taking: Reported on 06/08/2020)  . [DISCONTINUED] cloNIDine HCl (KAPVAY) 0.1 MG TB12 ER tablet TAKE 1 TABLET (0.1 MG TOTAL) BY MOUTH AT BEDTIME.   No facility-administered encounter medications on file as of 06/08/2020.    Allergies: No Known Allergies  Surgical History: Past Surgical History:  Procedure Laterality Date  . EYE MUSCLE SURGERY      Family History:  Family History  Problem Relation Age of Onset  . Cancer Maternal Grandmother   . Hypertension Maternal Grandfather   . Depression Maternal Grandfather   . Diabetes Paternal Grandfather   . Anxiety disorder Father   . Depression Mother   . Anxiety disorder Sister      Social History: Lives with: Living with mother    Physical Exam:  Vitals:   06/08/20  0936  BP: 120/80  Pulse: 94  Weight: 120 lb (54.4 kg)  Height: 5' (1.524 m)   BP 120/80   Pulse 94   Ht 5' (1.524 m)   Wt 120 lb (54.4 kg)   BMI 23.44 kg/m  Body mass index: body mass index is 23.44 kg/m. Blood pressure percentiles are not available for patients who are 18 years or older.  Ht Readings from Last 3 Encounters:  06/08/20 5' (1.524 m) (5 %, Z= -1.68)*  05/18/20 4' 11.15" (1.502 m) (2 %, Z= -2.01)*  05/04/20 4' 11.57" (1.513 m) (3 %, Z= -1.85)*   * Growth percentiles are based on CDC (Girls, 2-20 Years) data.   Wt Readings from Last 3 Encounters:  06/08/20 120 lb (54.4 kg) (34 %, Z= -0.42)*  05/18/20 119 lb 6.4 oz (54.2 kg) (33 %, Z= -0.45)*  05/04/20 118 lb 12.8 oz (53.9 kg) (32 %, Z= -0.48)*   * Growth percentiles are based on CDC (Girls, 2-20 Years) data.   Physical Exam  General: Well developed,  well nourished female in no acute distress.   Head: Normocephalic, atraumatic.   Eyes:  Pupils equal and round. EOMI.   Sclera white.  No eye drainage.   Ears/Nose/Mouth/Throat: Nares patent, no nasal drainage.  Normal dentition, mucous membranes moist.   Neck: supple, no cervical lymphadenopathy, no thyromegaly Cardiovascular: regular rate, normal S1/S2, no murmurs Respiratory: No increased work of breathing.  Lungs clear to auscultation bilaterally.  No wheezes. Abdomen: soft, nontender, nondistended. Normal bowel sounds.  No appreciable masses  Extremities: warm, well perfused, cap refill < 2 sec.   Musculoskeletal: Normal muscle mass.  Normal strength Skin: warm, dry.  No rash or lesions. Neurologic: alert and oriented, normal speech, no tremor    Labs: Results for orders placed or performed in visit on 06/08/20  POCT Glucose (Device for Home Use)  Result Value Ref Range   Glucose Fasting, POC     POC Glucose 98 70 - 99 mg/dl     Assessment/Plan: Weronika is a 19 y.o. female with type 1 diabetes on MDI and CGM therapy. Having a pattern of  hypoglycemia overnight due to overcorrecting with Novolog before bedtime. Hemoglobin A1c is 8.2% which is higher then ADA goal of <7%.     1-3. DM w/o complication type I, uncontrolled (HCC)/hyperglycemia/Insulin dose change  - 32 units of Lantus   - Novolog 120/30/5 plan   - At bedtime/2am, reduce dose by 1 unit.  - Reviewed insulin pump and CGM download. Discussed trends and patterns.  - Rotate pump sites to prevent scar tissue.  - bolus 15 minutes prior to eating to limit blood sugar spikes.  - Reviewed carb counting and importance of accurate carb counting.  - Discussed signs and symptoms of hypoglycemia. Always have glucose available.  - POCT glucose and hemoglobin A1c  - Reviewed growth chart.    4. Disordered eating - Follow up with Maralyn Sago (therapist) . Encouraged follow up  - Discussed how binging and purging can effect diabetes.   5. Depression/Anxiety  - Lexapro 15 mg daily  - Close follow up with Adolescent medicine. Discussed recent weight loss which she attributes to new job.   6. Mixed hyperlipidemia  - Take 2000 units of fish oil daily.  - Low cholesterol diet. .   7. Adjustment Disorder  - Discussed concerns and barriers to care.  - Answered questions.   Follow-up:   3 months.    >45 spent today reviewing the medical chart, counseling the patient/family, and documenting today's visit.  When a patient is on insulin, intensive monitoring of blood glucose levels is necessary to avoid hyperglycemia and hypoglycemia. Severe hyperglycemia/hypoglycemia can lead to hospital admissions and be life threatening.     Gretchen Short,  FNP-C  Pediatric Specialist  [address]  [place], [address]  Tele: [phone]

## 2020-06-08 NOTE — Patient Instructions (Addendum)
Hypoglycemia  . Shaking or trembling. . Sweating and chills. . Dizziness or lightheadedness. . Faster heart rate. Marland Kitchen Headaches. . Hunger. . Nausea. . Nervousness or irritability. . Pale skin. Marland Kitchen Restless sleep. . Weakness. Kennis Carina vision. . Confusion or trouble concentrating. . Sleepiness. . Slurred speech. . Tingling or numbness in the face or mouth.  How do I treat an episode of hypoglycemia? The American Diabetes Association recommends the "15-15 rule" for an episode of hypoglycemia: . Eat or drink 15 grams of carbs to raise your blood sugar. . After 15 minutes, check your blood sugar. . If it's still below 70 mg/dL, have another 15 grams of carbs. . Repeat until your blood sugar is at least 70 mg/dL.  Hyperglycemia  . Frequent urination . Increased thirst . Blurred vision . Fatigue . Headache Diabetic Ketoacidosis (DKA)  If hyperglycemia goes untreated, it can cause toxic acids (ketones) to build up in your blood and urine (ketoacidosis). Signs and symptoms include: . Fruity-smelling breath . Nausea and vomiting . Shortness of breath . Dry mouth . Weakness . Confusion . Coma . Abdominal pain        Sick day/Ketones Protocol  . Check blood glucose every 2 hours  . Check urine ketones every 2 hours (until ketones are clear)  . Drink plenty of fluids (water, Pedialyte) hourly . Give rapid acting insulin correction dose every 3 hours until ketones are clear  . Notify clinic of sickness/ketones  . If you develop signs of DKA, go to ER immediately.   Hemoglobin A1c levels     - Continue 32 units of lantus   - At bedtime/correction--> cut back novolog dose by 1 unit

## 2020-06-11 ENCOUNTER — Encounter (INDEPENDENT_AMBULATORY_CARE_PROVIDER_SITE_OTHER): Payer: Self-pay

## 2020-06-24 ENCOUNTER — Encounter (INDEPENDENT_AMBULATORY_CARE_PROVIDER_SITE_OTHER): Payer: Self-pay

## 2020-07-02 ENCOUNTER — Ambulatory Visit: Payer: BC Managed Care – PPO | Admitting: Family

## 2020-07-20 ENCOUNTER — Other Ambulatory Visit (INDEPENDENT_AMBULATORY_CARE_PROVIDER_SITE_OTHER): Payer: Self-pay | Admitting: Family

## 2020-07-23 ENCOUNTER — Other Ambulatory Visit (INDEPENDENT_AMBULATORY_CARE_PROVIDER_SITE_OTHER): Payer: Self-pay

## 2020-07-23 DIAGNOSIS — E109 Type 1 diabetes mellitus without complications: Secondary | ICD-10-CM

## 2020-07-23 MED ORDER — DEXCOM G6 TRANSMITTER MISC: 1.0000 "application " | 3 refills | Status: DC | PRN

## 2020-08-03 ENCOUNTER — Ambulatory Visit: Payer: BC Managed Care – PPO | Admitting: Pediatrics

## 2020-08-03 ENCOUNTER — Encounter: Payer: Self-pay | Admitting: Pediatrics

## 2020-08-03 VITALS — BP 149/98 | HR 87 | Ht 59.25 in | Wt 111.2 lb

## 2020-08-03 DIAGNOSIS — Z1389 Encounter for screening for other disorder: Secondary | ICD-10-CM

## 2020-08-03 DIAGNOSIS — F411 Generalized anxiety disorder: Secondary | ICD-10-CM | POA: Diagnosis not present

## 2020-08-03 DIAGNOSIS — F902 Attention-deficit hyperactivity disorder, combined type: Secondary | ICD-10-CM

## 2020-08-03 DIAGNOSIS — F5001 Anorexia nervosa, restricting type: Secondary | ICD-10-CM

## 2020-08-03 DIAGNOSIS — F4323 Adjustment disorder with mixed anxiety and depressed mood: Secondary | ICD-10-CM

## 2020-08-03 LAB — POCT URINALYSIS DIPSTICK
Bilirubin, UA: NEGATIVE
Blood, UA: NEGATIVE
Glucose, UA: POSITIVE — AB
Ketones, UA: NEGATIVE
Leukocytes, UA: NEGATIVE
Nitrite, UA: NEGATIVE
Protein, UA: NEGATIVE
Spec Grav, UA: 1.015 (ref 1.010–1.025)
Urobilinogen, UA: NEGATIVE E.U./dL — AB
pH, UA: 6.5 (ref 5.0–8.0)

## 2020-08-03 MED ORDER — MUPIROCIN 2 % EX OINT: TOPICAL_OINTMENT | CUTANEOUS | 0 refills | Status: DC

## 2020-08-03 MED ORDER — HYDROXYZINE HCL 25 MG PO TABS: 25.0000 mg | ORAL_TABLET | Freq: Three times a day (TID) | ORAL | 0 refills | Status: DC | PRN

## 2020-08-03 MED ORDER — BUPROPION HCL ER (XL) 150 MG PO TB24: 150.0000 mg | ORAL_TABLET | Freq: Every day | ORAL | 3 refills | Status: DC

## 2020-08-03 NOTE — Progress Notes (Signed)
History was provided by the patient.  Kelli Davis is a 19 y.o. female who is here for anxiety, depression, weight loss, T1DM.  Kelli Skill, FNP   HPI:  Pt reports that she went off her medication because her boyfriend said it was affecting her sex drive. She did and the subsequently he broke up with her anyway and cheated on her with her best friend.   She has been trying to eat at least two meals a day. She has been  Going to her dad's more to force herself to eat more. There was about 1 month where she didn't eat a lot because she was so sad r/t her breakup. The other day she did have a DE thought but was quickly able to combat it.   Sleeping well despite being off medication, thinks it is r/t depression.   She is very anxious today because she was worried about being off her medication.   Got a significant rash from PID tx but not sure which abx it was.   In between jobs- was at [employer] but about to stop at [employer].   No LMP recorded. (Menstrual status: Oral contraceptives).  Review of Systems  Constitutional: Positive for malaise/fatigue and weight loss.  Eyes: Negative for double vision.  Respiratory: Negative for shortness of breath.   Cardiovascular: Negative for chest pain and palpitations.  Gastrointestinal: Negative for abdominal pain, constipation, diarrhea, nausea and vomiting.  Genitourinary: Negative for dysuria.  Musculoskeletal: Negative for joint pain and myalgias.  Skin: Negative for rash.  Neurological: Negative for dizziness and headaches.  Endo/Heme/Allergies: Does not bruise/bleed easily.  Psychiatric/Behavioral: Positive for depression. Negative for suicidal ideas. The patient is nervous/anxious.     Patient Active Problem List   Diagnosis Date Noted  . Vitamin D deficiency 05/05/2020  . Insulin dose changed (HCC) 09/03/2018  . Mixed hyperlipidemia 09/03/2018  . Generalized anxiety disorder 09/04/2017  . Oral contraceptive pill surveillance  09/04/2017  . Eating disorder 05/16/2016  . Insomnia 04/12/2016  . Adjustment disorder with mixed anxiety and depressed mood 10/08/2015  . ADHD (attention deficit hyperactivity disorder), combined type 05/20/2015  . Maladaptive health behaviors affecting medical condition 10/17/2013  . Hyperglycemia 07/11/2011  . Hypoglycemia associated with diabetes (HCC)   . Uncontrolled type 1 diabetes mellitus with hyperglycemia (HCC) 12/13/2010    Current Outpatient Medications on File Prior to Visit  Medication Sig Dispense Refill  . ACCU-CHEK FASTCLIX LANCETS MISC Check sugar 10 x daily 300 each 3  . Continuous Blood Gluc Sensor (DEXCOM G6 SENSOR) MISC 1 APPLICATION BY DOES NOT APPLY ROUTE AS NEEDED. (Patient not taking: Reported on 06/08/2020) 3 each 5  . Continuous Blood Gluc Transmit (DEXCOM G6 TRANSMITTER) MISC 1 application by Does not apply route continuous as needed. 1 each 3  . cyclobenzaprine (FLEXERIL) 10 MG tablet Take 1 tablet 4 hours before arrival. Then, take 1 tablet after procedure if needed for cramping 2 tablet 0  . escitalopram (LEXAPRO) 10 MG tablet TAKE 1 AND 1/2 TABLETS DAILY BY MOUTH 135 tablet 1  . fluticasone (FLONASE) 50 MCG/ACT nasal spray SPRAY 1 SPRAY INTO EACH NOSTRIL EVERY DAY    . Glucagon (BAQSIMI ONE PACK) 3 MG/DOSE POWD Place 1 Dose into the nose as needed. (Patient not taking: Reported on 03/06/2020) 2 each 1  . glucose blood test strip CHECK SUGAR 6 X DAILY (Patient not taking: Reported on 06/08/2020) 600 each 5  . ibuprofen (ADVIL,MOTRIN) 200 MG tablet Take 400 mg by  mouth every 6 (six) hours as needed for headache, mild pain or cramping.  (Patient not taking: Reported on 06/08/2020)    . insulin aspart (NOVOLOG FLEXPEN) 100 UNIT/ML FlexPen Inject up to 50 units daily 15 mL 5  . Insulin Pen Needle (BD PEN NEEDLE NANO U/F) 32G X 4 MM MISC Use with insulin pens 6x daily (Patient not taking: Reported on 06/08/2020) 200 each 6  . LANTUS SOLOSTAR 100 UNIT/ML Solostar Pen  USE UP TO 50 UNITS DAILY 15 mL 1  . norgestimate-ethinyl estradiol (SPRINTEC 28) 0.25-35 MG-MCG tablet TAKE 1 TABLET DAILY. DISCARD PLACEBOS AND TAKE ACTIVE PILLS FOR CONTINUOUS CYCLING 112 tablet 4  . SUMAtriptan (IMITREX) 50 MG tablet 1 TAB EVERY 2HRS AS NEEDED MIGRAINE (NO MORE THAN 2 DOSES A DAY MAY REPEAT IN 2HRS IF PERSISTS/RECUR 10 tablet 0   No current facility-administered medications on file prior to visit.    No Known Allergies  Physical Exam:    Vitals:   08/03/20 1333 08/03/20 1351 08/03/20 1352  BP: 136/85 129/86 (!) 149/98  Pulse: 70 84 87  Weight: 111 lb 3.2 oz (50.4 kg)    Height: 4' 11.25" (1.505 m)      Growth percentile SmartLinks can only be used for patients less than 70 years old.  Physical Exam Vitals and nursing note reviewed.  Constitutional:      General: She is not in acute distress.    Appearance: She is well-developed.  Neck:     Thyroid: No thyromegaly.  Cardiovascular:     Rate and Rhythm: Normal rate and regular rhythm.     Heart sounds: No murmur heard.   Pulmonary:     Breath sounds: Normal breath sounds.  Abdominal:     Palpations: Abdomen is soft. There is no mass.     Tenderness: There is no abdominal tenderness. There is no guarding.  Musculoskeletal:     Right lower leg: No edema.     Left lower leg: No edema.  Lymphadenopathy:     Cervical: No cervical adenopathy.  Skin:    General: Skin is warm.     Findings: No rash.  Neurological:     Mental Status: She is alert.     Comments: No tremor  Psychiatric:        Mood and Affect: Mood is anxious.     Assessment/Plan: 1. Generalized anxiety disorder Restart lexapro at 10 mg  Then one week later, restart wellbutrin at 150 mg  Hydroxyzine 25 mg three times daily as needed or at bedtime . Would benefit from new therapist, referral placed today.  - buPROPion (WELLBUTRIN XL) 150 MG 24 hr tablet; Take 1 tablet (150 mg total) by mouth daily.  Dispense: 30 tablet; Refill: 3  2.  Anorexia nervosa, restricting type Has had some continued weight loss associated with loss of appetite r/t anxiety and depression. Discussed risks of eating disorder relapse with ongoing weight loss. expressed understanding. VSS today.   3. ADHD (attention deficit hyperactivity disorder), combined type Was stable on wellbutrin- will likely need to titrate dose back up.   4. Adjustment disorder with mixed anxiety and depressed mood As above.   5. Screening for genitourinary condition Per protocol.  - POCT urinalysis dipstick  Return in 2 weeks.   Alfonso Ramus, FNP

## 2020-08-03 NOTE — Patient Instructions (Addendum)
Restart lexapro at 10 mg  Then one week later, restart wellbutrin at 150 mg  Hydroxyzine 25 mg three times daily as needed or at bedtime   Add Perfect Bar at breakfast   [facility]   [address], [address] [place], [place] [address] [place]  Hours: Monday-Friday 9am-7pm  Phone: [phone]  Fax: [phone]

## 2020-08-17 ENCOUNTER — Other Ambulatory Visit: Payer: Self-pay | Admitting: Pediatrics

## 2020-08-17 DIAGNOSIS — F411 Generalized anxiety disorder: Secondary | ICD-10-CM

## 2020-08-18 ENCOUNTER — Ambulatory Visit: Payer: Self-pay | Admitting: Pediatrics

## 2020-09-05 ENCOUNTER — Encounter (INDEPENDENT_AMBULATORY_CARE_PROVIDER_SITE_OTHER): Payer: Self-pay

## 2020-09-08 ENCOUNTER — Ambulatory Visit (INDEPENDENT_AMBULATORY_CARE_PROVIDER_SITE_OTHER): Payer: BC Managed Care – PPO | Admitting: Family

## 2020-09-30 ENCOUNTER — Ambulatory Visit (INDEPENDENT_AMBULATORY_CARE_PROVIDER_SITE_OTHER): Payer: BC Managed Care – PPO | Admitting: Family

## 2020-09-30 ENCOUNTER — Encounter (INDEPENDENT_AMBULATORY_CARE_PROVIDER_SITE_OTHER): Payer: Self-pay | Admitting: Family

## 2020-09-30 ENCOUNTER — Other Ambulatory Visit: Payer: Self-pay

## 2020-09-30 VITALS — BP 122/80 | HR 74 | Wt 115.4 lb

## 2020-09-30 DIAGNOSIS — R7309 Other abnormal glucose: Secondary | ICD-10-CM

## 2020-09-30 DIAGNOSIS — F4323 Adjustment disorder with mixed anxiety and depressed mood: Secondary | ICD-10-CM

## 2020-09-30 DIAGNOSIS — E782 Mixed hyperlipidemia: Secondary | ICD-10-CM | POA: Diagnosis not present

## 2020-09-30 DIAGNOSIS — E1065 Type 1 diabetes mellitus with hyperglycemia: Secondary | ICD-10-CM | POA: Diagnosis not present

## 2020-09-30 DIAGNOSIS — R739 Hyperglycemia, unspecified: Secondary | ICD-10-CM | POA: Diagnosis not present

## 2020-09-30 DIAGNOSIS — Z794 Long term (current) use of insulin: Secondary | ICD-10-CM

## 2020-09-30 LAB — POCT GLUCOSE (DEVICE FOR HOME USE): POC Glucose: 207 mg/dl — AB (ref 70–99)

## 2020-09-30 LAB — POCT GLYCOSYLATED HEMOGLOBIN (HGB A1C): Hemoglobin A1C: 7.4 % — AB (ref 4.0–5.6)

## 2020-09-30 NOTE — Progress Notes (Signed)
Pediatric Endocrinology Diabetes Consultation Follow-up Visit  Kelli Davis 08/17/2000 [id number]  Chief Complaint: Follow-up type 1 diabetes   Kelli Skill, FNP   HPI: Kelli Davis  is a 19 y.o. female presenting for follow-up of type 1 diabetes. she is accompanied to this visit by her mother and father.   1. Kelli Davis was diagnosed with type 1 diabetes at age 14. At that time she was hospitalized at [facility] and was in DKA. She was in the ICU for 2 days. She was initially followed by Dr. Langston Davis in [place] but transferred to this clinic after Dr. Langston Davis retired. She has been admitted in DKA two additional times since diagnosis. She has been on pump therapy since age 62.  2. Since last visit to [facility] on 05/2020, she reports she has been well.   Her mom recently had COVID but Kelli Davis has been healthy. She is vaccinated but has not had her booster yet. Has been busy at [facility].   She is using Dexcom CGM and MDI for diabetes care. Reports that overall her blood sugars have been good with exception of a one week period where she tried to come off depression meds. Denies missing any injections.   Concern:  - Lows between 1am-6am.   Feels like her depression/anxiety and eating have improved. She is taking Wellbutrin and Lexapro. Has been trying to eat more and spend time outside.   Taking 1000 mg of fish oil dialy.   Insulin regimen: 30 units of Lantus, Novolog 120/30/5 Hypoglycemia: Able to feel low blood sugars.  No glucagon needed recently.  Dexcom CGm Download    Med-alert ID: Not currently wearing. Injection sites: arms and hips  Annual labs due: 04/2021 Ophthalmology due: 2019. Discussed importance of annual dilated eye exam.     3. ROS: Greater than 10 systems reviewed with pertinent positives listed in HPI, otherwise neg. Constitutional: Sleeping well. Weight stable.  Eyes: No changes in vision, denies blurry vision. Wears glasses.   Ears/Nose/Mouth/Throat: No difficulty swallowing. No neck pain  Cardiovascular: No palpitations, denies tachycardia  Respiratory: No increased work of breathing, No SOB  Gastrointestinal: No constipation or diarrhea. No abdominal pain Genitourinary: No nocturia, no polyuria Endocrine: No polydipsia.  No hyperpigmentation Psychiatric: Normal affect.+ anxiety and depression. Currently on Lexapro.   Past Medical History:   Past Medical History:  Diagnosis Date  . ADD (attention deficit disorder)   . Febrile seizure (HCC)   . History of eye surgery   . Hypoglycemia associated with diabetes (HCC)   . Hypoglycemia associated with diabetes (HCC)   . Physical growth delay   . Type 1 diabetes mellitus not at goal Kelli [facility])     Medications:  Outpatient Encounter Medications as of 09/30/2020  Medication Sig  . buPROPion (WELLBUTRIN XL) 150 MG 24 hr tablet TAKE 1 TABLET BY MOUTH EVERY DAY  . Continuous Blood Gluc Transmit (DEXCOM G6 TRANSMITTER) MISC 1 application by Does not apply route continuous as needed.  . cyclobenzaprine (FLEXERIL) 10 MG tablet Take 1 tablet 4 hours before arrival. Then, take 1 tablet after procedure if needed for cramping  . escitalopram (LEXAPRO) 10 MG tablet TAKE 1 AND 1/2 TABLETS DAILY BY MOUTH  . fluticasone (FLONASE) 50 MCG/ACT nasal spray SPRAY 1 SPRAY INTO EACH NOSTRIL EVERY DAY  . hydrOXYzine (ATARAX/VISTARIL) 25 MG tablet Take 1 tablet (25 mg total) by mouth 3 (three) times daily as needed.  Marland Kitchen ibuprofen (ADVIL,MOTRIN) 200 MG tablet Take 400 mg by mouth every  6 (six) hours as needed for headache, mild pain or cramping.  . insulin aspart (NOVOLOG FLEXPEN) 100 UNIT/ML FlexPen Inject up to 50 units daily  . LANTUS SOLOSTAR 100 UNIT/ML Solostar Pen USE UP TO 50 UNITS DAILY  . norgestimate-ethinyl estradiol (SPRINTEC 28) 0.25-35 MG-MCG tablet TAKE 1 TABLET DAILY. DISCARD PLACEBOS AND TAKE ACTIVE PILLS FOR CONTINUOUS CYCLING  . SUMAtriptan (IMITREX) 50 MG tablet 1 TAB  EVERY 2HRS AS NEEDED MIGRAINE (NO MORE THAN 2 DOSES A DAY MAY REPEAT IN 2HRS IF PERSISTS/RECUR  . ACCU-CHEK FASTCLIX LANCETS MISC Check sugar 10 x daily (Patient not taking: Reported on 09/30/2020)  . Continuous Blood Gluc Sensor (DEXCOM G6 SENSOR) MISC 1 APPLICATION BY DOES NOT APPLY ROUTE AS NEEDED. (Patient not taking: No sig reported)  . Glucagon (BAQSIMI ONE PACK) 3 MG/DOSE POWD Place 1 Dose into the nose as needed. (Patient not taking: No sig reported)  . glucose blood test strip CHECK SUGAR 6 X DAILY (Patient not taking: No sig reported)  . Insulin Pen Needle (BD PEN NEEDLE NANO U/F) 32G X 4 MM MISC Use with insulin pens 6x daily (Patient not taking: No sig reported)  . mupirocin ointment (BACTROBAN) 2 % Apply twice daily to underarm until bumps are gone (Patient not taking: Reported on 09/30/2020)   No facility-administered encounter medications on file as of 09/30/2020.    Allergies: Allergies  Allergen Reactions  . Penicillins Hives    Surgical History: Past Surgical History:  Procedure Laterality Date  . EYE MUSCLE SURGERY      Family History:  Family History  Problem Relation Age of Onset  . Cancer Maternal Grandmother   . Hypertension Maternal Grandfather   . Depression Maternal Grandfather   . Diabetes Paternal Grandfather   . Anxiety disorder Father   . Depression Mother   . Anxiety disorder Sister      Social History: Lives with: Living with mother    Physical Exam:  Vitals:   09/30/20 1018  BP: 122/80  Pulse: 74  Weight: 115 lb 6.4 oz (52.3 kg)   BP 122/80   Pulse 74   Wt 115 lb 6.4 oz (52.3 kg)   BMI 23.11 kg/m  Body mass index: body mass index is 23.11 kg/m. Growth percentile SmartLinks can only be used for patients less than 45 years old.  Ht Readings from Last 3 Encounters:  08/03/20 4' 11.25" (1.505 m)  06/08/20 5' (1.524 m) (5 %, Z= -1.68)*  05/18/20 4' 11.15" (1.502 m) (2 %, Z= -2.01)*   * Growth percentiles are based on CDC (Girls,  2-20 Years) data.   Wt Readings from Last 3 Encounters:  09/30/20 115 lb 6.4 oz (52.3 kg)  08/03/20 111 lb 3.2 oz (50.4 kg)  06/08/20 120 lb (54.4 kg) (34 %, Z= -0.42)*   * Growth percentiles are based on CDC (Girls, 2-20 Years) data.   Physical Exam  General: Well developed, well nourished female in no acute distress.  Head: Normocephalic, atraumatic.   Eyes:  Pupils equal and round. EOMI.   Sclera white.  No eye drainage.   Ears/Nose/Mouth/Throat: Nares patent, no nasal drainage.  Normal dentition, mucous membranes moist.   Neck: supple, no cervical lymphadenopathy, no thyromegaly Cardiovascular: regular rate, normal S1/S2, no murmurs Respiratory: No increased work of breathing.  Lungs clear to auscultation bilaterally.  No wheezes. Abdomen: soft, nontender, nondistended. Normal bowel sounds.  No appreciable masses  Extremities: warm, well perfused, cap refill < 2 sec.   Musculoskeletal: Normal muscle  mass.  Normal strength Skin: warm, dry.  No rash or lesions. Neurologic: alert and oriented, normal speech, no tremor   Labs: Results for orders placed or performed in visit on 09/30/20  POCT glycosylated hemoglobin (Hb A1C)  Result Value Ref Range   Hemoglobin A1C 7.4 (A) 4.0 - 5.6 %   HbA1c POC (<> result, manual entry)     HbA1c, POC (prediabetic range)     HbA1c, POC (controlled diabetic range)    POCT Glucose (Device for Home Use)  Result Value Ref Range   Glucose Fasting, POC     POC Glucose 207 (A) 70 - 99 mg/dl     Assessment/Plan: Tamikka is a 20 y.o. female with type 1 diabetes on MDI and CGM therapy. She is having a pattern of hypoglycemia overnight. Will reduce lantus and her correction factor at night. Hemoglobin A1c is 7.4% today.    1-3. DM w/o complication type I, uncontrolled (HCC)/hyperglycemia/Insulin dose change  - Decrease Lantus to 28 units  - Novolog 120/30/5 plan   - At bedtime and 2am: 1 unit for every 50 points >150 and 1 unit for every 10 grams  of carbs.  - Reviewed meter  and CGM download. Discussed trends and patterns.  - Rotate injection sites to prevent scar tissue.  - bolus 15 minutes prior to eating to limit blood sugar spikes.  - Reviewed carb counting and importance of accurate carb counting.  - Discussed signs and symptoms of hypoglycemia. Always have glucose available.  - POCT glucose and hemoglobin A1c  - Reviewed growth chart.     4. Disordered eating - Discussed effects of binging/purging on blood sugars.  - Close follow up with RD>   5. Depression/Anxiety  - Lexapro and Wellbutrin daily  - Close follow up with Adolescent medicine.   6. Mixed hyperlipidemia  - Take 2000 units of fish oil daily.  - Low cholesterol diet. .     Follow-up:   3 months.   >45 spent today reviewing the medical chart, counseling the patient/family, and documenting today's visit.   When a patient is on insulin, intensive monitoring of blood glucose levels is necessary to avoid hyperglycemia and hypoglycemia. Severe hyperglycemia/hypoglycemia can lead to hospital admissions and be life threatening.     Gretchen Short,  FNP-C  Pediatric Specialist  [address]  [place], [address]  Tele: [phone]

## 2020-09-30 NOTE — Patient Instructions (Signed)
-   Reduce lantus to 28 units  - At bedtime and 2am.   - Correction: 1 unit for every 50 points above 150.   - Carbs: 1 unit for every 10 grams of carbs.   Hypoglycemia  . Shaking or trembling. . Sweating and chills. . Dizziness or lightheadedness. . Faster heart rate. Marland Kitchen Headaches. . Hunger. . Nausea. . Nervousness or irritability. . Pale skin. Marland Kitchen Restless sleep. . Weakness. Kennis Carina vision. . Confusion or trouble concentrating. . Sleepiness. . Slurred speech. . Tingling or numbness in the face or mouth.  How do I treat an episode of hypoglycemia? The American Diabetes Association recommends the "15-15 rule" for an episode of hypoglycemia: . Eat or drink 15 grams of carbs to raise your blood sugar. . After 15 minutes, check your blood sugar. . If it's still below 70 mg/dL, have another 15 grams of carbs. . Repeat until your blood sugar is at least 70 mg/dL.  Hyperglycemia  . Frequent urination . Increased thirst . Blurred vision . Fatigue . Headache Diabetic Ketoacidosis (DKA)  If hyperglycemia goes untreated, it can cause toxic acids (ketones) to build up in your blood and urine (ketoacidosis). Signs and symptoms include: . Fruity-smelling breath . Nausea and vomiting . Shortness of breath . Dry mouth . Weakness . Confusion . Coma . Abdominal pain        Sick day/Ketones Protocol  . Check blood glucose every 2 hours  . Check urine ketones every 2 hours (until ketones are clear)  . Drink plenty of fluids (water, Pedialyte) hourly . Give rapid acting insulin correction dose every 3 hours until ketones are clear  . Notify clinic of sickness/ketones  . If you develop signs of DKA, go to ER immediately.   Hemoglobin A1c levels

## 2020-10-22 ENCOUNTER — Other Ambulatory Visit (INDEPENDENT_AMBULATORY_CARE_PROVIDER_SITE_OTHER): Payer: Self-pay

## 2020-10-22 DIAGNOSIS — R739 Hyperglycemia, unspecified: Secondary | ICD-10-CM

## 2020-10-22 MED ORDER — BD PEN NEEDLE NANO U/F 32G X 4 MM MISC: 6 refills | Status: DC

## 2020-11-09 ENCOUNTER — Other Ambulatory Visit (INDEPENDENT_AMBULATORY_CARE_PROVIDER_SITE_OTHER): Payer: Self-pay | Admitting: Family

## 2020-11-09 DIAGNOSIS — E1065 Type 1 diabetes mellitus with hyperglycemia: Secondary | ICD-10-CM

## 2020-12-28 ENCOUNTER — Ambulatory Visit (INDEPENDENT_AMBULATORY_CARE_PROVIDER_SITE_OTHER): Payer: BC Managed Care – PPO | Admitting: Family

## 2020-12-28 NOTE — Progress Notes (Deleted)
Pediatric Endocrinology Diabetes Consultation Follow-up Visit  Kelli Davis Feb 23, 2001 [id number]  Chief Complaint: Follow-up type 1 diabetes   [name], FNP   HPI: Kelli Davis  is a 20 y.o. female presenting for follow-up of type 1 diabetes. she is accompanied to this visit by her mother and father.   1. Kelli Davis was diagnosed with type 1 diabetes at age 2. At that time she was hospitalized at [facility] center and was in DKA. She was in the ICU for 2 days. She was initially followed by Dr. Langston Masker in [place] but transferred to this clinic after Dr. Langston Masker retired. She has been admitted in DKA two additional times since diagnosis. She has been on pump therapy since age 73.  2. Since last visit to [facility] on 09/2020, she reports she has been well.   Her mom recently had COVID but Kelli Davis has been healthy. She is vaccinated but has not had her booster yet. Has been busy at [facility].   She is using Dexcom CGM and MDI for diabetes care. Reports that overall her blood sugars have been good with exception of a one week period where she tried to come off depression meds. Denies missing any injections.   Concern:  - Lows between 1am-6am.   Feels like her depression/anxiety and eating have improved. She is taking Wellbutrin and Lexapro. Has been trying to eat more and spend time outside.   Taking 1000 mg of fish oil dialy.   Insulin regimen: 30 units of Lantus, Novolog 120/30/5 Hypoglycemia: Able to feel low blood sugars.  No glucagon needed recently.  Dexcom CGm Download    Med-alert ID: Not currently wearing. Injection sites: arms and hips  Annual labs due: 04/2021 Ophthalmology due: 2019. Discussed importance of annual dilated eye exam.     3. ROS: Greater than 10 systems reviewed with pertinent positives listed in HPI, otherwise neg. Constitutional: Sleeping well. Weight stable.  Eyes: No changes in vision, denies blurry vision. Wears glasses.   Ears/Nose/Mouth/Throat: No difficulty swallowing. No neck pain  Cardiovascular: No palpitations, denies tachycardia  Respiratory: No increased work of breathing, No SOB  Gastrointestinal: No constipation or diarrhea. No abdominal pain Genitourinary: No nocturia, no polyuria Endocrine: No polydipsia.  No hyperpigmentation Psychiatric: Normal affect.+ anxiety and depression. Currently on Lexapro.   Past Medical History:   Past Medical History:  Diagnosis Date  . ADD (attention deficit disorder)   . Febrile seizure (HCC)   . History of eye surgery   . Hypoglycemia associated with diabetes (HCC)   . Hypoglycemia associated with diabetes (HCC)   . Physical growth delay   . Type 1 diabetes mellitus not at goal [facility])     Medications:  Outpatient Encounter Medications as of 12/28/2020  Medication Sig  . ACCU-CHEK FASTCLIX LANCETS MISC Check sugar 10 x daily (Patient not taking: Reported on 09/30/2020)  . buPROPion (WELLBUTRIN XL) 150 MG 24 hr tablet TAKE 1 TABLET BY MOUTH EVERY DAY  . Continuous Blood Gluc Sensor (DEXCOM G6 SENSOR) MISC 1 APPLICATION BY DOES NOT APPLY ROUTE AS NEEDED. (Patient not taking: No sig reported)  . Continuous Blood Gluc Transmit (DEXCOM G6 TRANSMITTER) MISC 1 application by Does not apply route continuous as needed.  . cyclobenzaprine (FLEXERIL) 10 MG tablet Take 1 tablet 4 hours before arrival. Then, take 1 tablet after procedure if needed for cramping  . escitalopram (LEXAPRO) 10 MG tablet TAKE 1 AND 1/2 TABLETS DAILY BY MOUTH  . fluticasone (FLONASE) 50 MCG/ACT nasal spray  SPRAY 1 SPRAY INTO EACH NOSTRIL EVERY DAY  . Glucagon (BAQSIMI ONE PACK) 3 MG/DOSE POWD Place 1 Dose into the nose as needed. (Patient not taking: No sig reported)  . glucose blood test strip CHECK SUGAR 6 X DAILY (Patient not taking: No sig reported)  . hydrOXYzine (ATARAX/VISTARIL) 25 MG tablet Take 1 tablet (25 mg total) by mouth 3 (three) times daily as needed.  Marland Kitchen ibuprofen (ADVIL,MOTRIN)  200 MG tablet Take 400 mg by mouth every 6 (six) hours as needed for headache, mild pain or cramping.  . insulin aspart (NOVOLOG FLEXPEN) 100 UNIT/ML FlexPen Inject up to 50 units daily  . Insulin Pen Needle (BD PEN NEEDLE NANO U/F) 32G X 4 MM MISC Use with insulin pens 6x daily  . LANTUS SOLOSTAR 100 UNIT/ML Solostar Pen USE UP TO 50 UNITS DAILY  . mupirocin ointment (BACTROBAN) 2 % Apply twice daily to underarm until bumps are gone (Patient not taking: Reported on 09/30/2020)  . norgestimate-ethinyl estradiol (SPRINTEC 28) 0.25-35 MG-MCG tablet TAKE 1 TABLET DAILY. DISCARD PLACEBOS AND TAKE ACTIVE PILLS FOR CONTINUOUS CYCLING  . SUMAtriptan (IMITREX) 50 MG tablet 1 TAB EVERY 2HRS AS NEEDED MIGRAINE (NO MORE THAN 2 DOSES A DAY MAY REPEAT IN 2HRS IF PERSISTS/RECUR   No facility-administered encounter medications on file as of 12/28/2020.    Allergies: Allergies  Allergen Reactions  . Penicillins Hives    Surgical History: Past Surgical History:  Procedure Laterality Date  . EYE MUSCLE SURGERY      Family History:  Family History  Problem Relation Age of Onset  . Cancer Maternal Grandmother   . Hypertension Maternal Grandfather   . Depression Maternal Grandfather   . Diabetes Paternal Grandfather   . Anxiety disorder Father   . Depression Mother   . Anxiety disorder Sister      Social History: Lives with: Living with mother    Physical Exam:  There were no vitals filed for this visit. There were no vitals taken for this visit. Body mass index: body mass index is unknown because there is no height or weight on file. Growth percentile SmartLinks can only be used for patients less than 40 years old.  Ht Readings from Last 3 Encounters:  08/03/20 4' 11.25" (1.505 m)  06/08/20 5' (1.524 m) (5 %, Z= -1.68)*  05/18/20 4' 11.15" (1.502 m) (2 %, Z= -2.01)*   * Growth percentiles are based on CDC (Girls, 2-20 Years) data.   Wt Readings from Last 3 Encounters:  09/30/20 115 lb  6.4 oz (52.3 kg)  08/03/20 111 lb 3.2 oz (50.4 kg)  06/08/20 120 lb (54.4 kg) (34 %, Z= -0.42)*   * Growth percentiles are based on CDC (Girls, 2-20 Years) data.   Physical Exam  . General: Well developed, well nourished female in no acute distress.   Head: Normocephalic, atraumatic.   Eyes:  Pupils equal and round. EOMI.   Sclera white.  No eye drainage.   Ears/Nose/Mouth/Throat: Nares patent, no nasal drainage.  Normal dentition, mucous membranes moist.   Neck: supple, no cervical lymphadenopathy, no thyromegaly Cardiovascular: regular rate, normal S1/S2, no murmurs Respiratory: No increased work of breathing.  Lungs clear to auscultation bilaterally.  No wheezes. Abdomen: soft, nontender, nondistended. Normal bowel sounds.  No appreciable masses  Extremities: warm, well perfused, cap refill < 2 sec.   Musculoskeletal: Normal muscle mass.  Normal strength Skin: warm, dry.  No rash or lesions. Neurologic: alert and oriented, normal speech, no tremor  Labs:  Results for orders placed or performed in visit on 09/30/20  POCT glycosylated hemoglobin (Hb A1C)  Result Value Ref Range   Hemoglobin A1C 7.4 (A) 4.0 - 5.6 %   HbA1c POC (<> result, manual entry)     HbA1c, POC (prediabetic range)     HbA1c, POC (controlled diabetic range)    POCT Glucose (Device for Home Use)  Result Value Ref Range   Glucose Fasting, POC     POC Glucose 207 (A) 70 - 99 mg/dl     Assessment/Plan: Kaidynce is a 20 y.o. female with type 1 diabetes on MDI and CGM therapy. She is having a pattern of hypoglycemia overnight. Will reduce lantus and her correction factor at night. Hemoglobin A1c is 7.4% today.    1-3. DM w/o complication type I, uncontrolled (HCC)/hyperglycemia/Insulin dose change  - Decrease Lantus to 28 units  - Novolog 120/30/5 plan   - At bedtime and 2am: 1 unit for every 50 points >150 and 1 unit for every 10 grams of carbs.  - Reviewed meter and CGM download. Discussed trends and  patterns.  - Rotate injection sites to prevent scar tissue.  - bolus 15 minutes prior to eating to limit blood sugar spikes.  - Reviewed carb counting and importance of accurate carb counting.  - Discussed signs and symptoms of hypoglycemia. Always have glucose available.  - POCT glucose and hemoglobin A1c  - Reviewed growth chart.   4. Disordered eating - Discussed effects of binging/purging on blood sugars.  - Close follow up with RD>   5. Depression/Anxiety  - Lexapro and Wellbutrin daily  - Close follow up with Adolescent medicine.   6. Mixed hyperlipidemia  - Take 2000 units of fish oil daily.  - Low cholesterol diet. .     Follow-up:   3 months.   >45 spent today reviewing the medical chart, counseling the patient/family, and documenting today's visit.   When a patient is on insulin, intensive monitoring of blood glucose levels is necessary to avoid hyperglycemia and hypoglycemia. Severe hyperglycemia/hypoglycemia can lead to hospital admissions and be life threatening.     Gretchen Short,  FNP-C  [facility]ist  [address]  [place], [address]  Tele: [phone]

## 2021-01-05 ENCOUNTER — Other Ambulatory Visit (INDEPENDENT_AMBULATORY_CARE_PROVIDER_SITE_OTHER): Payer: Self-pay | Admitting: Family

## 2021-02-17 ENCOUNTER — Other Ambulatory Visit (INDEPENDENT_AMBULATORY_CARE_PROVIDER_SITE_OTHER): Payer: Self-pay | Admitting: Family

## 2021-02-17 DIAGNOSIS — E1065 Type 1 diabetes mellitus with hyperglycemia: Secondary | ICD-10-CM

## 2021-03-02 ENCOUNTER — Other Ambulatory Visit (INDEPENDENT_AMBULATORY_CARE_PROVIDER_SITE_OTHER): Payer: Self-pay | Admitting: Family

## 2021-03-02 DIAGNOSIS — E1065 Type 1 diabetes mellitus with hyperglycemia: Secondary | ICD-10-CM

## 2021-04-29 ENCOUNTER — Ambulatory Visit (INDEPENDENT_AMBULATORY_CARE_PROVIDER_SITE_OTHER): Payer: BC Managed Care – PPO | Admitting: Family

## 2021-04-29 ENCOUNTER — Encounter (INDEPENDENT_AMBULATORY_CARE_PROVIDER_SITE_OTHER): Payer: Self-pay | Admitting: Family

## 2021-04-29 ENCOUNTER — Other Ambulatory Visit: Payer: Self-pay

## 2021-04-29 VITALS — BP 122/76 | HR 74

## 2021-04-29 DIAGNOSIS — F419 Anxiety disorder, unspecified: Secondary | ICD-10-CM

## 2021-04-29 DIAGNOSIS — Z794 Long term (current) use of insulin: Secondary | ICD-10-CM | POA: Diagnosis not present

## 2021-04-29 DIAGNOSIS — F32A Depression, unspecified: Secondary | ICD-10-CM

## 2021-04-29 DIAGNOSIS — E782 Mixed hyperlipidemia: Secondary | ICD-10-CM

## 2021-04-29 DIAGNOSIS — E1065 Type 1 diabetes mellitus with hyperglycemia: Secondary | ICD-10-CM

## 2021-04-29 DIAGNOSIS — F509 Eating disorder, unspecified: Secondary | ICD-10-CM

## 2021-04-29 LAB — POCT GLYCOSYLATED HEMOGLOBIN (HGB A1C): Hemoglobin A1C: 10.4 % — AB (ref 4.0–5.6)

## 2021-04-29 LAB — POCT GLUCOSE (DEVICE FOR HOME USE): Glucose Fasting, POC: 208 mg/dL — AB (ref 70–99)

## 2021-04-29 MED ORDER — TRESIBA FLEXTOUCH 100 UNIT/ML ~~LOC~~ SOPN: PEN_INJECTOR | SUBCUTANEOUS | 6 refills | Status: DC

## 2021-04-29 NOTE — Patient Instructions (Addendum)
-   You have an appointment to see [name] NP at adolescent medicine tomorrow at 11am.   - Call to schedule follow up with [name] at [facility]oles[facility]  - Increase Lantus to 33 units and 35 units when on cycle.  - Follow up in 2 weeks.    Please sign up for MyChart. This is a communication tool that allows you to send an email directly to me. This can be used for questions, prescriptions and blood sugar reports. We will also release labs to you with instructions on MyChart. Please do not use MyChart if you need immediate or emergency assistance. Ask our wonderful front office staff if you need assistance.   At [facility], we are committed to providing exceptional care. You will receive a patient satisfaction survey through text or email regarding your visit today. Your opinion is important to me. Comments are appreciated.

## 2021-04-29 NOTE — Progress Notes (Signed)
Pediatric Endocrinology Diabetes Consultation Follow-up Visit  Kelli Davis 2001-04-02 [id number]  Chief Complaint: Follow-up type 1 diabetes   [name], FNP   HPI: Kelli Davis  is a 20 y.o. female presenting for follow-up of type 1 diabetes. she is accompanied to this visit by her mother and father.   1. Kelli Davis was diagnosed with type 1 diabetes at age 54. At that time she was hospitalized at [facility] center and was in DKA. She was in the ICU for 2 days. She was initially followed by Dr. Langston Masker in [place] but transferred to this clinic after Dr. Langston Masker retired. She has been admitted in DKA two additional times since diagnosis. She has been on pump therapy since age 78.  2. Since last visit to [facility] on 09/2020, she reports she has been well.   Her boyfriend recently passed away 2 weeks ago. She has been struggling with depression. She started back to work 1 day ago. She reports that she has not been sleeping for about 2 months. She has been having nightmares. She states she has no "motivation to do anything". She has been living with a friend who is helping her and also going over to see her boyfriends family.   Taking Wellbutrin and Lexapro daily. She reports she is not missin doses but does take them at random times. She not seen her therapist or Adolescent medicine. She called to see her therapist, Maralyn Sago, but was told she could no get appointment for 2 weeks.   She is wearing Dexcom CGM most of the time, when she does not have it on she is using her meter. Takes insulin when she is eating but reports that she has not been eating much over the past two weeks. She feels like her blood sugars are running high even when she gives insulin but also states that she may not be giving novolog consistently due to dealing with the loss of her boyfriend. Low blood sugars have been rare.   Concerns:  - Running high overnight because she is not sleeping.  - Wants insulin pump  -  insurance will no longer pay for lantus   Insulin regimen: 30 units of Lantus (takes 32 when on menstrual cycle) Novolog 120/30/5 Hypoglycemia: Able to feel low blood sugars.  No glucagon needed recently.  Dexcom CGm Download    Med-alert ID: Not currently wearing. Injection sites: arms and hips  Annual labs due: 04/2021 Ophthalmology due: 2019. Discussed importance of annual dilated eye exam.     3. ROS: Greater than 10 systems reviewed with pertinent positives listed in HPI, otherwise neg. Constitutional: Very poor sleep. Unable to get weight today. She began crying when getting on scale so she was taken to exam room.  Eyes: No changes in vision, denies blurry vision. Wears glasses.  Ears/Nose/Mouth/Throat: No difficulty swallowing. No neck pain  Cardiovascular: No palpitations, denies tachycardia  Respiratory: No increased work of breathing, No SOB  Gastrointestinal: No constipation or diarrhea. No abdominal pain Genitourinary: No nocturia, no polyuria Endocrine: No polydipsia.  No hyperpigmentation Psychiatric: Normal affect.+ anxiety and depression. Currently on Lexapro.   Past Medical History:   Past Medical History:  Diagnosis Date   ADD (attention deficit disorder)    Febrile seizure (HCC)    History of eye surgery    Hypoglycemia associated with diabetes (HCC)    Hypoglycemia associated with diabetes (HCC)    Physical growth delay    Type 1 diabetes mellitus not at goal [facility])  Medications:  Outpatient Encounter Medications as of 04/29/2021  Medication Sig   ACCU-CHEK FASTCLIX LANCETS MISC Check sugar 10 x daily   buPROPion (WELLBUTRIN XL) 150 MG 24 hr tablet TAKE 1 TABLET BY MOUTH EVERY DAY   Continuous Blood Gluc Sensor (DEXCOM G6 SENSOR) MISC 1 APPLICATION BY DOES NOT APPLY ROUTE AS NEEDED.   Continuous Blood Gluc Transmit (DEXCOM G6 TRANSMITTER) MISC 1 application by Does not apply route continuous as needed.   escitalopram (LEXAPRO) 10 MG tablet TAKE 1 AND  1/2 TABLETS DAILY BY MOUTH   fluticasone (FLONASE) 50 MCG/ACT nasal spray SPRAY 1 SPRAY INTO EACH NOSTRIL EVERY DAY   Glucagon (BAQSIMI ONE PACK) 3 MG/DOSE POWD Place 1 Dose into the nose as needed.   glucose blood test strip CHECK SUGAR 6 X DAILY   hydrOXYzine (ATARAX/VISTARIL) 25 MG tablet Take 1 tablet (25 mg total) by mouth 3 (three) times daily as needed.   ibuprofen (ADVIL,MOTRIN) 200 MG tablet Take 400 mg by mouth every 6 (six) hours as needed for headache, mild pain or cramping.   insulin aspart (NOVOLOG FLEXPEN) 100 UNIT/ML FlexPen INJECT UP TO 50 UNITS SUBCUTANEOUSLY DAILY   insulin degludec (TRESIBA FLEXTOUCH) 100 UNIT/ML FlexTouch Pen Inject up to 50 units per day SubQ   Insulin Pen Needle (BD PEN NEEDLE NANO U/F) 32G X 4 MM MISC Use with insulin pens 6x daily   mupirocin ointment (BACTROBAN) 2 % Apply twice daily to underarm until bumps are gone   norgestimate-ethinyl estradiol (SPRINTEC 28) 0.25-35 MG-MCG tablet TAKE 1 TABLET DAILY. DISCARD PLACEBOS AND TAKE ACTIVE PILLS FOR CONTINUOUS CYCLING   [DISCONTINUED] LANTUS SOLOSTAR 100 UNIT/ML Solostar Pen USE UP TO 50 UNITS DAILY   cyclobenzaprine (FLEXERIL) 10 MG tablet Take 1 tablet 4 hours before arrival. Then, take 1 tablet after procedure if needed for cramping (Patient not taking: Reported on 04/29/2021)   SUMAtriptan (IMITREX) 50 MG tablet 1 TAB EVERY 2HRS AS NEEDED MIGRAINE (NO MORE THAN 2 DOSES A DAY MAY REPEAT IN 2HRS IF PERSISTS/RECUR (Patient not taking: Reported on 04/29/2021)   No facility-administered encounter medications on file as of 04/29/2021.    Allergies: Allergies  Allergen Reactions   Penicillins Hives    Surgical History: Past Surgical History:  Procedure Laterality Date   EYE MUSCLE SURGERY      Family History:  Family History  Problem Relation Age of Onset   Cancer Maternal Grandmother    Hypertension Maternal Grandfather    Depression Maternal Grandfather    Diabetes Paternal Grandfather     Anxiety disorder Father    Depression Mother    Anxiety disorder Sister      Social History: Lives with: Living with mother    Physical Exam:  Vitals:   04/29/21 0955  BP: 122/76  Pulse: 74    BP 122/76 (BP Location: Left Arm, Patient Position: Sitting, Cuff Size: Normal)   Pulse 74  Body mass index: body mass index is unknown because there is no height or weight on file. Growth percentile SmartLinks can only be used for patients less than 15 years old.  Ht Readings from Last 3 Encounters:  08/03/20 4' 11.25" (1.505 m)  06/08/20 5' (1.524 m) (5 %, Z= -1.68)*  05/18/20 4' 11.15" (1.502 m) (2 %, Z= -2.01)*   * Growth percentiles are based on CDC (Girls, 2-20 Years) data.   Wt Readings from Last 3 Encounters:  09/30/20 115 lb 6.4 oz (52.3 kg)  08/03/20 111 lb 3.2 oz (50.4 kg)  06/08/20 120 lb (54.4 kg) (34 %, Z= -0.42)*   * Growth percentiles are based on CDC (Girls, 2-20 Years) data.   Physical Exam  General: Tearful and depressed appearing female.   Head: Normocephalic, atraumatic.   Eyes:  Pupils equal and round. EOMI.   Sclera white.  No eye drainage.   Ears/Nose/Mouth/Throat: Nares patent, no nasal drainage.  Normal dentition, mucous membranes moist.   Neck: supple, no cervical lymphadenopathy, no thyromegaly Cardiovascular: regular rate, normal S1/S2, no murmurs Respiratory: No increased work of breathing.  Lungs clear to auscultation bilaterally.  No wheezes. Abdomen: soft, nontender, nondistended. Normal bowel sounds.  No appreciable masses  Extremities: warm, well perfused, cap refill < 2 sec.   Musculoskeletal: Normal muscle mass.  Normal strength Skin: warm, dry.  No rash or lesions. Neurologic: alert and oriented, normal speech, no tremor    Labs: Results for orders placed or performed in visit on 04/29/21  POCT glycosylated hemoglobin (Hb A1C)  Result Value Ref Range   Hemoglobin A1C 10.4 (A) 4.0 - 5.6 %   HbA1c POC (<> result, manual entry)      HbA1c, POC (prediabetic range)     HbA1c, POC (controlled diabetic range)    POCT Glucose (Device for Home Use)  Result Value Ref Range   Glucose Fasting, POC 208 (A) 70 - 99 mg/dL   POC Glucose       Assessment/Plan: Rhyann is a 20 y.o. female with type 1 diabetes on MDI and CGM therapy. Struggling with depression and emotion from recently loss of boyfriend, Josh. Diabetes care has been inconsistent over the past month and she is having frequent hyperglycemia. Her hemoglobin A1c has increased to 10.4%. May benefit from closed loop insulin pump therapy.    1-3. DM w/o complication type I, uncontrolled (HCC)/hyperglycemia/Insulin dose change  - Increase Lantus to 33 units. Will attempt to prescribe Evaristo Bury and see if insurance will cover it. She is unable to use Levemir which is preferred brand due to allergic reaction.  - Novolog 120/30/5 plan   - At bedtime and 2am: 1 unit for every 50 points >150 and 1 unit for every 10 grams of carbs.  - Reviewed CGM download. Discussed trends and patterns.  - Rotate injection  sites to prevent scar tissue.  - bolus 15 minutes prior to eating to limit blood sugar spikes.  - Reviewed carb counting and importance of accurate carb counting.  - Discussed signs and symptoms of hypoglycemia. Always have glucose available.  - POCT glucose and hemoglobin A1c  - Reviewed growth chart.  - Discussed options for Omnipod 5 closed loop insulin pump and Tandem Control IQ   4. Disordered eating - Discussed effects of binging/purging on blood sugars.  - Close follow up with RD>   5. Depression/Anxiety  - Lexapro and Wellbutrin daily  - Close follow up with Adolescent medicine. Appointment made for tomorrow at 11 am.  - Discussed healthy coping strategies.   6. Mixed hyperlipidemia  - Take 2000 units of fish oil daily.  - Low cholesterol diet. .     Follow-up:   2 weeks.   >45 spent today reviewing the medical chart, counseling the patient/family, and  documenting today's visit.    When a patient is on insulin, intensive monitoring of blood glucose levels is necessary to avoid hyperglycemia and hypoglycemia. Severe hyperglycemia/hypoglycemia can lead to hospital admissions and be life threatening.     Gretchen Short,  FNP-C  Pediatric Specialist  [address]  [place], [address]  Tele: [phone]

## 2021-04-30 ENCOUNTER — Ambulatory Visit (INDEPENDENT_AMBULATORY_CARE_PROVIDER_SITE_OTHER): Payer: BC Managed Care – PPO | Admitting: Licensed Clinical Social Worker

## 2021-04-30 ENCOUNTER — Telehealth (INDEPENDENT_AMBULATORY_CARE_PROVIDER_SITE_OTHER): Payer: Self-pay | Admitting: Pharmacist

## 2021-04-30 ENCOUNTER — Ambulatory Visit: Payer: BC Managed Care – PPO | Admitting: Family

## 2021-04-30 VITALS — BP 131/86 | HR 99 | Ht 59.88 in | Wt 102.0 lb

## 2021-04-30 DIAGNOSIS — F4323 Adjustment disorder with mixed anxiety and depressed mood: Secondary | ICD-10-CM | POA: Diagnosis not present

## 2021-04-30 DIAGNOSIS — F430 Acute stress reaction: Secondary | ICD-10-CM

## 2021-04-30 DIAGNOSIS — Z1389 Encounter for screening for other disorder: Secondary | ICD-10-CM

## 2021-04-30 DIAGNOSIS — F411 Generalized anxiety disorder: Secondary | ICD-10-CM | POA: Diagnosis not present

## 2021-04-30 DIAGNOSIS — F4321 Adjustment disorder with depressed mood: Secondary | ICD-10-CM

## 2021-04-30 DIAGNOSIS — G479 Sleep disorder, unspecified: Secondary | ICD-10-CM

## 2021-04-30 LAB — POCT URINALYSIS DIPSTICK
Bilirubin, UA: NEGATIVE
Blood, UA: POSITIVE
Glucose, UA: POSITIVE — AB
Ketones, UA: NEGATIVE
Nitrite, UA: NEGATIVE
Protein, UA: NEGATIVE
Spec Grav, UA: 1.015 (ref 1.010–1.025)
Urobilinogen, UA: NEGATIVE E.U./dL — AB
pH, UA: 6.5 (ref 5.0–8.0)

## 2021-04-30 MED ORDER — BUPROPION HCL ER (XL) 300 MG PO TB24: 300.0000 mg | ORAL_TABLET | Freq: Every day | ORAL | 0 refills | Status: DC

## 2021-04-30 MED ORDER — HYDROXYZINE HCL 25 MG PO TABS: 25.0000 mg | ORAL_TABLET | Freq: Three times a day (TID) | ORAL | 0 refills | Status: DC | PRN
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
## 2021-04-30 NOTE — BH Specialist Note (Cosign Needed)
Integrated Behavioral Health Initial In-Person Visit  MRN: [id number] Name: Kelli Davis  Number of Integrated Behavioral Health Clinician visits:: 1/6 Session Start time: 1:32 PM   Session End time: 2:58 PM Total time:  86  minutes  Types of Service: Individual psychotherapy  Interpretor:No. Interpretor Name and Language: n/a   Warm Hand Off Completed.    Subjective: Kelli Davis is a 20 y.o. female accompanied by Mother, attended appointment alone Patient was referred by Beatriz Stallion NP for grief. Patient reports the following symptoms/concerns: difficulty with activities of daily living following traumatic loss Duration of problem: weeks; Severity of problem: severe  Objective: Mood: Depressed and Affect: Appropriate and Tearful Risk of harm to self or others: No plan to harm self or others, patient reported having information on [facility] and using social supports. No intent or plan   Life Context: Family and Social: Staying with [employer]/Work: Works at [employer], has taken some time off recently but is interested in going back soon  Self-Care: Connecting with friends, Spending time with boyfriend's parents Life Changes: Boyfriend Josh passed away suddenly two weeks ago   Patient and/or Family's Strengths/Protective Factors: Social connections, Social and Patent attorney, Concrete supports in place (healthy food, safe environments, etc.), Sense of purpose, and knowledge and use of positive coping skills, history of counseling, improvements in help-seeking behaviors   Goals Addressed: Patient will: Demonstrate ability to: Increase healthy adjustment to current life circumstances through being gentle with herself, continuing to attempt to care for self, and allowing other's to support her   Progress towards Goals: Ongoing  Interventions: Interventions utilized: Supportive Counseling, Psychoeducation and/or Health Education, and Supportive Reflection   Standardized Assessments completed: Not Needed  Patient and/or Family Response: Patient worked to process emotions related to recent loss. Patient reported engaging in many activities to care for self and using social supports. Patient worked with [facility] to identify plan to care for self following appointment.   Patient Centered Plan: Patient is on the following Treatment Plan(s):  Grief  Assessment: Patient currently experiencing grief.   Patient may benefit from continued support of this clinic to support patient in maintaining care of self and processing grief.  Plan: Follow up with behavioral health clinician on : 9/20 at 1:30 PM Behavioral recommendations: Focus on caring for body: Eat, Sleep, Meds, Hygiene (whatever that looks like), Remember the bare minimum is completely acceptable, Continue to allow others to support you Referral(s): Integrated Behavioral Health Services (In Clinic) "From scale of 1-10, how likely are you to follow plan?": Patient agreeable to above plan   Carleene Overlie, [facility]

## 2021-04-30 NOTE — Telephone Encounter (Signed)
Called the patient's pharmacy to determine pharmacy benefits  [place]: [id number]  RxPCN:  [employer]Group: [id number] ID: [id number] Pharmacy help desk: [phone]  Called pharmacy help desk to determine test claims for copay prices  Omnipod 5 Intro Kit (1 kit for 30 day supply): not covered/requires coverage exclusion or prior authorization ($395.23 at [facility], $140 ([facility] for 30 days[facility])  Omnipod 5 Pods (1 box of 5 pods, 10 day supply): not covered/requires coverage exclusion or prior authorization (cannot see price until the PA is approved, estimated price $197.62 (15 pods for 90 day supply) and $70.06 (5 pods for 10 day supply) at [place]) Pharmacy representative stated that she could not change pod quantity to 15 for 30 day supply and 45 for 90 day supply as requested  Thank you for involving clinical pharmacist/diabetes educator to assist in providing this patient's care.   Drexel Iha, PharmD, BCACP, [place], CPP

## 2021-04-30 NOTE — Telephone Encounter (Signed)
Submitted Omnipod 5 prior authorizations on covermymeds on 04/30/21.    Thank you for involving clinical pharmacist/diabetes educator to assist in providing this patient's care.   Zachery Conch, PharmD, BCACP, CDCES, CPP

## 2021-05-01 ENCOUNTER — Encounter: Payer: Self-pay | Admitting: Family

## 2021-05-03 ENCOUNTER — Encounter: Payer: Self-pay | Admitting: Family

## 2021-05-03 ENCOUNTER — Telehealth: Payer: Self-pay

## 2021-05-03 NOTE — Telephone Encounter (Signed)
Spoke with patient. Helped with MyChart access for patient. She has increased medication as recommended. She is feeling "ok." She will update provider via MyChart.

## 2021-05-03 NOTE — Telephone Encounter (Signed)
-----   Message from Georges Mouse, NP sent at 05/03/2021 10:39 AM EDT ----- Please call patient to check on her. See if she has increased medication and how she is feeling.

## 2021-05-04 ENCOUNTER — Other Ambulatory Visit: Payer: Self-pay

## 2021-05-04 ENCOUNTER — Ambulatory Visit (INDEPENDENT_AMBULATORY_CARE_PROVIDER_SITE_OTHER): Payer: BC Managed Care – PPO | Admitting: Licensed Clinical Social Worker

## 2021-05-04 DIAGNOSIS — F430 Acute stress reaction: Secondary | ICD-10-CM

## 2021-05-04 DIAGNOSIS — F4323 Adjustment disorder with mixed anxiety and depressed mood: Secondary | ICD-10-CM
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
## 2021-05-11 ENCOUNTER — Other Ambulatory Visit: Payer: Self-pay

## 2021-05-11 ENCOUNTER — Ambulatory Visit (INDEPENDENT_AMBULATORY_CARE_PROVIDER_SITE_OTHER): Payer: BC Managed Care – PPO | Admitting: Pediatrics

## 2021-05-11 VITALS — BP 122/87 | HR 87 | Ht 60.24 in | Wt 107.6 lb

## 2021-05-11 DIAGNOSIS — F4323 Adjustment disorder with mixed anxiety and depressed mood: Secondary | ICD-10-CM | POA: Diagnosis not present

## 2021-05-11 DIAGNOSIS — F515 Nightmare disorder: Secondary | ICD-10-CM

## 2021-05-11 DIAGNOSIS — F430 Acute stress reaction: Secondary | ICD-10-CM | POA: Diagnosis not present

## 2021-05-11 MED ORDER — PRAZOSIN HCL 1 MG PO CAPS: 1.0000 mg | ORAL_CAPSULE | Freq: Every day | ORAL | 1 refills | Status: DC

## 2021-05-11 MED ORDER — HYDROXYZINE HCL 10 MG PO TABS: 10.0000 mg | ORAL_TABLET | Freq: Three times a day (TID) | ORAL | 3 refills | Status: DC | PRN

## 2021-05-11 NOTE — Progress Notes (Signed)
History was provided by the patient.  Kelli Davis is a 20 y.o. female who is here for acute grief, insomnia, anxiety, depression.  [name], FNP   HPI:  Pt reports she continues to have high glucoses but hopeful her pump will come in this week.   Has still been struggling with eating. Has had more on some days, but continues with severe anxiety that is causing nausea.   Is staying with various friends as her relationship with mom continues to be significantly strained. She is planning to move out to her own space within the month.   She is safe to herself today despite occasional passive SI. Really wishes she could get good sleep- even with hydroxyzine still having bad nightmares.    No LMP recorded. (Menstrual status: Oral contraceptives).    Patient Active Problem List   Diagnosis Date Noted   Vitamin D deficiency 05/05/2020   Insulin dose changed (HCC) 09/03/2018   Mixed hyperlipidemia 09/03/2018   Generalized anxiety disorder 09/04/2017   Oral contraceptive pill surveillance 09/04/2017   Eating disorder 05/16/2016   Insomnia 04/12/2016   Adjustment disorder with mixed anxiety and depressed mood 10/08/2015   ADHD (attention deficit hyperactivity disorder), combined type 05/20/2015   Maladaptive health behaviors affecting medical condition 10/17/2013   Hyperglycemia 07/11/2011   Hypoglycemia associated with diabetes (HCC)    Uncontrolled type 1 diabetes mellitus with hyperglycemia (HCC) 12/13/2010    Current Outpatient Medications on File Prior to Visit  Medication Sig Dispense Refill   ACCU-CHEK FASTCLIX LANCETS MISC Check sugar 10 x daily 300 each 3   buPROPion (WELLBUTRIN XL) 300 MG 24 hr tablet Take 1 tablet (300 mg total) by mouth daily. 30 tablet 0   Continuous Blood Gluc Sensor (DEXCOM G6 SENSOR) MISC 1 APPLICATION BY DOES NOT APPLY ROUTE AS NEEDED. 3 each 5   Continuous Blood Gluc Transmit (DEXCOM G6 TRANSMITTER) MISC 1 application by Does not apply  route continuous as needed. 1 each 3   escitalopram (LEXAPRO) 10 MG tablet TAKE 1 AND 1/2 TABLETS DAILY BY MOUTH 135 tablet 1   fluticasone (FLONASE) 50 MCG/ACT nasal spray SPRAY 1 SPRAY INTO EACH NOSTRIL EVERY DAY     Glucagon (BAQSIMI ONE PACK) 3 MG/DOSE POWD Place 1 Dose into the nose as needed. 2 each 1   glucose blood test strip CHECK SUGAR 6 X DAILY 600 each 5   ibuprofen (ADVIL,MOTRIN) 200 MG tablet Take 400 mg by mouth every 6 (six) hours as needed for headache, mild pain or cramping.     insulin aspart (NOVOLOG FLEXPEN) 100 UNIT/ML FlexPen INJECT UP TO 50 UNITS SUBCUTANEOUSLY DAILY 15 mL 5   insulin degludec (TRESIBA FLEXTOUCH) 100 UNIT/ML FlexTouch Pen Inject up to 50 units per day SubQ 15 mL 6   Insulin Pen Needle (BD PEN NEEDLE NANO U/F) 32G X 4 MM MISC Use with insulin pens 6x daily 200 each 6   mupirocin ointment (BACTROBAN) 2 % Apply twice daily to underarm until bumps are gone 22 g 0   norgestimate-ethinyl estradiol (SPRINTEC 28) 0.25-35 MG-MCG tablet TAKE 1 TABLET DAILY. DISCARD PLACEBOS AND TAKE ACTIVE PILLS FOR CONTINUOUS CYCLING 112 tablet 4   No current facility-administered medications on file prior to visit.    Allergies  Allergen Reactions   Penicillins Hives     Physical Exam:    Vitals:   05/11/21 0945  BP: 122/87  Pulse: 87  Weight: 107 lb 9.6 oz (48.8 kg)  Height: 5' 0.24" (1.53  m)    Growth percentile SmartLinks can only be used for patients less than 15 years old.  Physical Exam Vitals and nursing note reviewed.  Constitutional:      General: She is not in acute distress.    Appearance: She is well-developed.  Neck:     Thyroid: No thyromegaly.  Cardiovascular:     Rate and Rhythm: Normal rate and regular rhythm.     Heart sounds: No murmur heard. Pulmonary:     Breath sounds: Normal breath sounds.  Abdominal:     Palpations: Abdomen is soft. There is no mass.     Tenderness: There is no abdominal tenderness. There is no guarding.   Musculoskeletal:     Right lower leg: No edema.     Left lower leg: No edema.  Lymphadenopathy:     Cervical: No cervical adenopathy.  Skin:    General: Skin is warm.     Findings: No rash.  Neurological:     Mental Status: She is alert.     Comments: No tremor  Psychiatric:        Mood and Affect: Mood is anxious. Affect is tearful.    Assessment/Plan: 1. Adjustment disorder with mixed anxiety and depressed mood Continue lexapro 15 mg and wellbutrin 300 mg daily. Seeing therapist here weekly for now. Continues to process her grief and struggles. Doing some better in daily self care and eating- weight has improved some this visit.   2. Acute stress reaction Instructed to use hydroxyzine 10 mg TID every day and take prazosin 1 mg at bedtime. We will adjust need week if needed.  - hydrOXYzine (ATARAX/VISTARIL) 10 MG tablet; Take 1 tablet (10 mg total) by mouth 3 (three) times daily as needed.  Dispense: 30 tablet; Refill: 3 - prazosin (MINIPRESS) 1 MG capsule; Take 1 capsule (1 mg total) by mouth at bedtime.  Dispense: 30 capsule; Refill: 1  3. Nightmares As above- has had nightmares for years, but acutely worse now. Discussed need to take consistently every night.  - prazosin (MINIPRESS) 1 MG capsule; Take 1 capsule (1 mg total) by mouth at bedtime.  Dispense: 30 capsule; Refill: 1  Return in 1 week.   Alfonso Ramus, FNP

## 2021-05-12 NOTE — Telephone Encounter (Addendum)
Checked status on 05/12/21 on covermymeds and Omnipod 5 intro kit was denied on 05/02/21 and Omnipod 5 refill pods prior authorization was cancelled due to   "The device is not on the formulary and is covered when the member uses less than 200 units of insulin per day"  The anticipated cost of prescriptions are listed as follows (~$140 for intro kit and ~$197 for 90 day supply of refill pods (could possibly be $70 for 30 day supply) [facility])   Omnipod 5 Intro Kit (1 kit for 30 day supply): not covered/requires coverage exclusion or prior authorization ($395.23 at [facility], $140 ([facility] for [facility] days[facility])  Omnipod 5 Pods (1 box of 5 pods, 10 day supply): not covered/requires coverage exclusion or prior authorization (cannot see price until the PA is approved, estimated price $197.62 (15 pods for 90 day supply) and $70.06 (5 pods for 10 day supply) at [place]) Pharmacy representative stated that she could not change pod quantity to 15 for 30 day supply and 45 for 90 day supply as requested   Please contact patient and inform her we attempt to do a PA to get 3 boxes/month of pods covered; total of 15 pods/month. However, her insurance denied this, but may allow her to use 2 boxes/month (10 pods/month). We can try to do 2 boxes/month as long as she does not use more than 65 units/day. Please discuss this information along with cost with her and see what she would prefer to do for diabetes management.  Thank you for involving clinical pharmacist/diabetes educator to assist in providing this patient's care.   Drexel Iha, PharmD, BCACP, [place], CPP

## 2021-05-13 ENCOUNTER — Ambulatory Visit: Payer: BC Managed Care – PPO | Admitting: Licensed Clinical Social Worker

## 2021-05-13 ENCOUNTER — Encounter (INDEPENDENT_AMBULATORY_CARE_PROVIDER_SITE_OTHER): Payer: Self-pay | Admitting: Family

## 2021-05-13 ENCOUNTER — Ambulatory Visit (INDEPENDENT_AMBULATORY_CARE_PROVIDER_SITE_OTHER): Payer: BC Managed Care – PPO | Admitting: Family

## 2021-05-13 ENCOUNTER — Other Ambulatory Visit: Payer: Self-pay

## 2021-05-13 VITALS — BP 102/60 | HR 88 | Wt 103.8 lb

## 2021-05-13 DIAGNOSIS — F419 Anxiety disorder, unspecified: Secondary | ICD-10-CM | POA: Diagnosis not present

## 2021-05-13 DIAGNOSIS — E782 Mixed hyperlipidemia: Secondary | ICD-10-CM | POA: Diagnosis not present

## 2021-05-13 DIAGNOSIS — F430 Acute stress reaction: Secondary | ICD-10-CM

## 2021-05-13 DIAGNOSIS — F509 Eating disorder, unspecified: Secondary | ICD-10-CM | POA: Diagnosis not present

## 2021-05-13 DIAGNOSIS — E1065 Type 1 diabetes mellitus with hyperglycemia: Secondary | ICD-10-CM | POA: Diagnosis not present

## 2021-05-13 DIAGNOSIS — F32A Depression, unspecified: Secondary | ICD-10-CM

## 2021-05-13 DIAGNOSIS — Z794 Long term (current) use of insulin: Secondary | ICD-10-CM

## 2021-05-13 LAB — POCT GLUCOSE (DEVICE FOR HOME USE): Glucose Fasting, POC: 233 mg/dL — AB (ref 70–99)

## 2021-05-13 MED ORDER — GLUCOSE BLOOD VI STRP: ORAL_STRIP | 3 refills | Status: AC

## 2021-05-13 NOTE — BH Specialist Note (Signed)
Integrated Behavioral Health Follow Up In-Person Visit  MRN: [id number] Name: Kelli Davis  Number of Integrated Behavioral Health Clinician visits: 3/6 Session Start time: 8:49 AM   Session End time: 9:43 AM  Total time:  54  minutes  Types of Service: Individual psychotherapy  Interpretor:No. Interpretor Name and Language: n/a  Subjective: Kelli Davis is a 20 y.o. female accompanied by  self Patient was referred by Kelli Stallion FNP for grief. Patient reports the following symptoms/concerns: continued issues with sleep and eating, no improvements yet with changes in medications, continued grief and difficulty with activities of daily living following traumatic loss Duration of problem: weeks; Severity of problem: severe  Objective: Mood: Depressed and Affect: Tearful Risk of harm to self or others: No plan to harm self or others  Life Context: Family and Social: Staying with [employer]/Work: Works at [employer], returned to work yesterday and plans to go today Self-Care: Connecting with friends, Spending time with boyfriend's parents, setting boundaries, continued appointments with adolescent medicine and endo  Life Changes: medication changes this week   Patient and/or Family's Strengths/Protective Factors: Social connections, Social and Patent attorney, Concrete supports in place (healthy food, safe environments, etc.), Sense of purpose, and knowledge and use of positive coping skills, history of counseling, improvements in help-seeking behaviors    Goals Addressed: Patient will: Demonstrate ability to: Increase healthy adjustment to current life circumstances through being gentle with herself, continuing to attempt to care for self, and allowing other's to support her    Interventions: Interventions utilized:  Supportive Counseling, Psychoeducation and/or Health Education, and Supportive Reflection Standardized Assessments completed: Not Needed  The  Antidepressant Side-Effect Checklist (ASEC)  Perceived side-effects ( 0 = absent, 1 = mild, 2 = moderate, 3 = severe )   Perceived side-effects (0 is none, 3 is very high) Linked to antidepressant?  Dry mouth  2 No.  Drowsiness 1 No.  Insomnia  3 No.  Blurred Vision  0 No.  Headache 2 No.  Constipation  0 No.  Diarrhea  0 No.  Increased appetite  0 No.  Decreased appetite 2 No.  Nausea of vommitting 3 No.  Problems with urination 0 No.  Problems with sexual function  0 No.  Palpitations  0 No.  Feeling light-headed on standing  2 No.  Feeling like the room is spinning  0 No.  Sweating  0 No.  Increased body temp  0 No.  Tremor  3 Yes.    Disorientation 0 No.  Yawning  3 Yes.    Weight gain 2 Yes.    Suicidal ideation 0 No.   What other symptoms have you had since the antidepressant medication (or since last completing the ASEC) that you think may be side-effects of the medication?  None  Have you had any treatment for a side-effect?  None  Has any side-effect led to you discontinuing the antidepressant medication?  No   Patient and/or Family Response: Patient reported starting prozosin last night for sleep and not being able to go to sleep until 5 am, waking at 7 am and feeling very groggy. No major side effects with adjustments to lexapro. Reported weight gain- five pounds in last two weeks, and reported having not been eating much recently. Patient worked to process emotions related to recent loss and create a plan to continue to set boundaries with others.   Patient Centered Plan: Patient is on the following Treatment Plan(s):  Grief   Assessment: Patient currently experiencing grief.  Patient may benefit from continued support of this clinic to support patient in maintaining care of self and processing grief and connection to ongoing counseling services.   Plan: Follow up with behavioral health clinician on : 10/7 at 11:45 AM (call if you need to make this appt  virtual)  Behavioral recommendations: Continue to allow others to support you when possible, focus on caring for body: Eat, Sleep, Meds, Hygiene (in any form), Continue setting personal boundaries and prioritizing your needs Referral(s): Integrated Art gallery manager (In Clinic) and [facility] (LME/Outside Clinic) Female therapist Kelli Davis not religious counseling "From scale of 1-10, how likely are you to follow plan?": Patient agreeable to above plan   Carleene Overlie, [facility]

## 2021-05-13 NOTE — Progress Notes (Signed)
Pediatric Endocrinology Diabetes Consultation Follow-up Visit  Kelli Davis 03-10-2001 [id number]  Chief Complaint: Follow-up type 1 diabetes   [name], FNP   HPI: Kelli Davis  is a 20 y.o. female presenting for follow-up of type 1 diabetes. she is accompanied to this visit by her mother and father.   1. Kelli Davis was diagnosed with type 1 diabetes at age 30. At that time she was hospitalized at [facility] center and was in DKA. She was in the ICU for 2 days. She was initially followed by Dr. Langston Masker in [place] but transferred to this clinic after Dr. Langston Masker retired. She has been admitted in DKA two additional times since diagnosis. She has been on pump therapy since age 19.  2. Since last visit to [facility] on 09/2020, she reports she has been well.   She is being followed closely by Alfonso Ramus and [name] with Adolescent medicine for grief and depression since her boyfriend passed away in a tragic motorcycle accident. She was recently started on hydroxyzine and ... Continues taking Wellbutrin and Lexapro daily. She has still not been sleeping well, has vivid nightmares. She is getting therapy from counselors at the [facility] center.   She reports that diabetes care has been about the same since her last visit. She recently had a Dexcom CGM sensor failed so she is having to prick her fingers. She states that she is taking all of her injections but has a hard time getting her blood sugars down. 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  She is not eating but occasionally gives correction Novolog. Estimates she gives around 15 units for correction doses.    Insulin regimen: 30 units of Tresiba(takes 32 when on menstrual cycle) Novolog 120/30/5 Hypoglycemia: Able to feel low blood sugars.  No glucagon needed recently.  Dexcom CGm Download    Med-alert ID: Not currently wearing. Injection sites: arms and  hips  Annual labs due: 04/2021 Ophthalmology due: 2019. Discussed importance of annual dilated eye exam.     3. ROS: Greater than 10 systems reviewed with pertinent positives listed in HPI, otherwise neg. Constitutional: Very poor sleep.  Eyes: No changes in vision, denies blurry vision. Wears glasses.  Ears/Nose/Mouth/Throat: No difficulty swallowing. No neck pain  Cardiovascular: No palpitations, denies tachycardia  Respiratory: No increased work of breathing, No SOB  Gastrointestinal: No constipation or diarrhea. No abdominal pain Genitourinary: No nocturia, no polyuria Endocrine: No polydipsia.  No hyperpigmentation Psychiatric: Normal affect.+ anxiety and depression. Currently on Lexapro.   Past Medical History:   Past Medical History:  Diagnosis Date   ADD (attention deficit disorder)    Febrile seizure (HCC)    History of eye surgery    Hypoglycemia associated with diabetes (HCC)    Hypoglycemia associated with diabetes (HCC)    Physical growth delay    Type 1 diabetes mellitus not at goal [facility])     Medications:  Outpatient Encounter Medications as of 05/13/2021  Medication Sig   ACCU-CHEK FASTCLIX LANCETS MISC Check sugar 10 x daily   buPROPion (WELLBUTRIN XL) 300 MG 24 hr tablet Take 1 tablet (300 mg total) by mouth daily.   Continuous Blood Gluc Sensor (DEXCOM G6 SENSOR) MISC 1 APPLICATION BY DOES NOT APPLY ROUTE AS NEEDED.   Continuous Blood Gluc Transmit (DEXCOM G6 TRANSMITTER) MISC 1 application by Does not apply route continuous as needed.   escitalopram (LEXAPRO) 10 MG tablet TAKE  1 AND 1/2 TABLETS DAILY BY MOUTH   fluticasone (FLONASE) 50 MCG/ACT nasal spray SPRAY 1 SPRAY INTO EACH NOSTRIL EVERY DAY   glucose blood test strip Use as instructed   hydrOXYzine (ATARAX/VISTARIL) 10 MG tablet Take 1 tablet (10 mg total) by mouth 3 (three) times daily as needed.   ibuprofen (ADVIL,MOTRIN) 200 MG tablet Take 400 mg by mouth every 6 (six) hours as needed for headache,  mild pain or cramping.   insulin aspart (NOVOLOG FLEXPEN) 100 UNIT/ML FlexPen INJECT UP TO 50 UNITS SUBCUTANEOUSLY DAILY   insulin degludec (TRESIBA FLEXTOUCH) 100 UNIT/ML FlexTouch Pen Inject up to 50 units per day SubQ   Insulin Pen Needle (BD PEN NEEDLE NANO U/F) 32G X 4 MM MISC Use with insulin pens 6x daily   mupirocin ointment (BACTROBAN) 2 % Apply twice daily to underarm until bumps are gone   norgestimate-ethinyl estradiol (SPRINTEC 28) 0.25-35 MG-MCG tablet TAKE 1 TABLET DAILY. DISCARD PLACEBOS AND TAKE ACTIVE PILLS FOR CONTINUOUS CYCLING   prazosin (MINIPRESS) 1 MG capsule Take 1 capsule (1 mg total) by mouth at bedtime.   [DISCONTINUED] glucose blood test strip CHECK SUGAR 6 X DAILY   Glucagon (BAQSIMI ONE PACK) 3 MG/DOSE POWD Place 1 Dose into the nose as needed. (Patient not taking: Reported on 05/13/2021)   No facility-administered encounter medications on file as of 05/13/2021.    Allergies: Allergies  Allergen Reactions   Penicillins Hives    Surgical History: Past Surgical History:  Procedure Laterality Date   EYE MUSCLE SURGERY      Family History:  Family History  Problem Relation Age of Onset   Cancer Maternal Grandmother    Hypertension Maternal Grandfather    Depression Maternal Grandfather    Diabetes Paternal Grandfather    Anxiety disorder Father    Depression Mother    Anxiety disorder Sister      Social History: Lives with: Living with mother    Physical Exam:  Vitals:   05/13/21 0953  BP: 102/60  Pulse: 88  Weight: 103 lb 12.8 oz (47.1 kg)     BP 102/60   Pulse 88   Wt 103 lb 12.8 oz (47.1 kg)   BMI 20.11 kg/m  Body mass index: body mass index is 20.11 kg/m. Growth percentile SmartLinks can only be used for patients less than 58 years old.  Ht Readings from Last 3 Encounters:  05/11/21 5' 0.24" (1.53 m)  04/30/21 4' 11.88" (1.521 m)  08/03/20 4' 11.25" (1.505 m)   Wt Readings from Last 3 Encounters:  05/13/21 103 lb 12.8 oz  (47.1 kg)  05/11/21 107 lb 9.6 oz (48.8 kg)  04/30/21 102 lb (46.3 kg)   Physical Exam  General: Depressed appearing female that is very pleasant and answers questions appropriately. In no acute distress.  Head: Normocephalic, atraumatic.   Eyes:  Pupils equal and round. EOMI.   Sclera white.  No eye drainage.   Ears/Nose/Mouth/Throat: Nares patent, no nasal drainage.  Normal dentition, mucous membranes moist.   Neck: supple, no cervical lymphadenopathy, no thyromegaly Cardiovascular: regular rate, normal S1/S2, no murmurs Respiratory: No increased work of breathing.  Lungs clear to auscultation bilaterally.  No wheezes. Abdomen: soft, nontender, nondistended. Normal bowel sounds.  No appreciable masses  Extremities: warm, well perfused, cap refill < 2 sec.   Musculoskeletal: Normal muscle mass.  Normal strength Skin: warm, dry.  No rash or lesions. Neurologic: alert and oriented, normal speech, no tremor   Labs: Results for orders placed  or performed in visit on 05/13/21  POCT Glucose (Device for Home Use)  Result Value Ref Range   Glucose Fasting, POC 233 (A) 70 - 99 mg/dL   POC Glucose       Assessment/Plan: Kelli Davis is a 20 y.o. female with type 1 diabetes on MDI and CGM therapy. She is having frequent hyperglycemia with unclear cause. It could be due to stress and grief but may also be due to missed insulin doses. She would benefit from closed loop insulin pump therapy.    1-3. DM w/o complication type I, uncontrolled (HCC)/hyperglycemia/Insulin dose change  - Increase Tresiba to 34 units  - Novolog 120/30/5 plan   - At bedtime and 2am: 1 unit for every 50 points >150 and 1 unit for every 10 grams of carbs.  - Reviewed CGM download. Discussed trends and patterns.  - Rotate injection sites to prevent scar tissue.  - bolus 15 minutes prior to eating to limit blood sugar spikes.  - Reviewed carb counting and importance of accurate carb counting.  - Discussed signs and  symptoms of hypoglycemia. Always have glucose available.  - POCT glucose and hemoglobin A1c  - Reviewed growth chart.  - Advised to journal all of her blood sugars and insulin doses.  - Will do blood sugar calls with Dr. Ladona Ridgel for insulin titration between visits.   4. Disordered eating - Discussed effects of binging/purging on blood sugars.  - Close follow up with RD>   5. Depression/Anxiety  - Lexapro and Wellbutrin daily  - Close follow up with Adolescent medicine.  - Discussed healthy coping strategies. Encouraged close follow up with counseling as well.   6. Mixed hyperlipidemia  - Take 2000 units of fish oil daily.  - Low cholesterol diet. .     Follow-up:   2 weeks.   >45 spent today reviewing the medical chart, counseling the patient/family, and documenting today's visit.  When a patient is on insulin, intensive monitoring of blood glucose levels is necessary to avoid hyperglycemia and hypoglycemia. Severe hyperglycemia/hypoglycemia can lead to hospital admissions and be life threatening.     Gretchen Short,  FNP-C  Pediatric Specialist  [address]  [place], [address]  Tele: [phone]

## 2021-05-13 NOTE — Patient Instructions (Addendum)
It was a pleasure seeing you in clinic today. Please do not hesitate to contact me if you have questions or concerns.   - Increase Tresiba to 34 units  - Every 4 hours, if not eating, give correction dose during the day per your plan.  - next week, blood sugar call with Dr. Ladona Ridgel. Do calls weekly  - 4 week follow up.

## 2021-05-14 NOTE — Telephone Encounter (Signed)
Contacted pharmacy help desk at [phone] to determine test claims for copays   Omnipod Dash (3 boxes for 30 day supply): 35% of cash price (tier 3); prior authorization is required  Cash price of 1 box is ~$300 -If patient requires 2 boxes/month (changes pod every 3 days, cannot be using more than 65 units/day): ~$600 cash price (~$210 with insurance) -If patient requires 3 boxes/month (changes pod every 2 days, cannot be using more than 100 units/day): ~$900 cash price (~$315 with insurance)  We can try to go through DME supplier if patient would prefer to see if cost is more affordable there  Contracted DME suppliers with patient's insurance plan are listed as follows:  [facility]  Address: [address] [place], [address] Phone number: [phone]  [facility] Address: [address] [place] [address] Phone number: [phone]  [facility] Address: [address] [place], [place] [address] Phone number: [phone]  [facility]  Address: [address] [place] [address] Phone number: [phone]  [facility] Address: [address] [place], [place] [address] Phone number: [phone]  [facility] Address: [address] [place] [address] Phone number: [phone]  [facility] Address: [address] [place] [address] Phone number: [phone]  I recommend using [facility] or [facility] as we have contacts at each of these DME suppliers and can assist if necessary or if issues arise.   Thank you for involving clinical pharmacist/diabetes educator to assist in providing this patient's care.   Zachery Conch, PharmD, BCACP, CDCES, CPP

## 2021-05-18 ENCOUNTER — Other Ambulatory Visit: Payer: Self-pay

## 2021-05-18 ENCOUNTER — Ambulatory Visit: Payer: BC Managed Care – PPO | Admitting: Pediatrics

## 2021-05-18 VITALS — BP 112/78 | HR 130 | Ht 59.45 in | Wt 102.4 lb

## 2021-05-18 DIAGNOSIS — E1065 Type 1 diabetes mellitus with hyperglycemia: Secondary | ICD-10-CM

## 2021-05-18 DIAGNOSIS — F430 Acute stress reaction: Secondary | ICD-10-CM | POA: Diagnosis not present

## 2021-05-18 DIAGNOSIS — Z1389 Encounter for screening for other disorder: Secondary | ICD-10-CM

## 2021-05-18 DIAGNOSIS — F411 Generalized anxiety disorder: Secondary | ICD-10-CM

## 2021-05-18 DIAGNOSIS — R634 Abnormal weight loss: Secondary | ICD-10-CM

## 2021-05-18 LAB — GLUCOSE, POCT (MANUAL RESULT ENTRY): POC Glucose: 144 mg/dl — AB (ref 70–99)

## 2021-05-18 LAB — POCT URINALYSIS DIPSTICK
Bilirubin, UA: NEGATIVE
Blood, UA: NEGATIVE
Glucose, UA: POSITIVE — AB
Ketones, UA: POSITIVE
Leukocytes, UA: NEGATIVE
Nitrite, UA: NEGATIVE
Protein, UA: POSITIVE — AB
Spec Grav, UA: 1.025 (ref 1.010–1.025)
Urobilinogen, UA: NEGATIVE E.U./dL — AB
pH, UA: 5 (ref 5.0–8.0)

## 2021-05-18 MED ORDER — PRAZOSIN HCL 2 MG PO CAPS: 2.0000 mg | ORAL_CAPSULE | Freq: Every day | ORAL | 3 refills | Status: DC

## 2021-05-18 NOTE — Progress Notes (Signed)
History was provided by the patient.  Kelli Davis is a 20 y.o. female who is here for grief, anxiety, depression ADHD, T1DM.  Kelli Skill, FNP   HPI:  Pt reports she has been taking hydroxizine TID which has made her sleepy during the day. It helps anxiety some but feels more numb. Says she did take it this morning. Still not sleeping well at night- can't go back to sleep well. Hasn't had any more nightmares.   Cried all day yesterday and couldn't stop. Having more panic attacks that come out of the blue.   Checked glucose in the car otw here- says it was 280- not wearing dexcom. Can't get glucose under 200. Continues to run really high. Has been giving injections but not eating much at all. Taking long tresiba daily. Glucoses in the AM are some better but feels low when she is 140s. Insurance isn't going to cover all of pump therapy.   Denies vomiting or other DKA sx. Has been having headaches, nausea and fatigue.   No LMP recorded. (Menstrual status: Oral contraceptives).   Patient Active Problem List   Diagnosis Date Noted   Vitamin D deficiency 05/05/2020   Insulin dose changed (HCC) 09/03/2018   Mixed hyperlipidemia 09/03/2018   Generalized anxiety disorder 09/04/2017   Oral contraceptive pill surveillance 09/04/2017   Eating disorder 05/16/2016   Insomnia 04/12/2016   Adjustment disorder with mixed anxiety and depressed mood 10/08/2015   ADHD (attention deficit hyperactivity disorder), combined type 05/20/2015   Maladaptive health behaviors affecting medical condition 10/17/2013   Hyperglycemia 07/11/2011   Hypoglycemia associated with diabetes (HCC)    Uncontrolled type 1 diabetes mellitus with hyperglycemia (HCC) 12/13/2010    Current Outpatient Medications on File Prior to Visit  Medication Sig Dispense Refill   ACCU-CHEK FASTCLIX LANCETS MISC Check sugar 10 x daily 300 each 3   buPROPion (WELLBUTRIN XL) 300 MG 24 hr tablet Take 1 tablet (300 mg total) by  mouth daily. 30 tablet 0   Continuous Blood Gluc Sensor (DEXCOM G6 SENSOR) MISC 1 APPLICATION BY DOES NOT APPLY ROUTE AS NEEDED. 3 each 5   Continuous Blood Gluc Transmit (DEXCOM G6 TRANSMITTER) MISC 1 application by Does not apply route continuous as needed. 1 each 3   escitalopram (LEXAPRO) 10 MG tablet TAKE 1 AND 1/2 TABLETS DAILY BY MOUTH 135 tablet 1   fluticasone (FLONASE) 50 MCG/ACT nasal spray SPRAY 1 SPRAY INTO EACH NOSTRIL EVERY DAY     Glucagon (BAQSIMI ONE PACK) 3 MG/DOSE POWD Place 1 Dose into the nose as needed. (Patient not taking: Reported on 05/13/2021) 2 each 1   glucose blood test strip Use as instructed 300 each 3   hydrOXYzine (ATARAX/VISTARIL) 10 MG tablet Take 1 tablet (10 mg total) by mouth 3 (three) times daily as needed. 30 tablet 3   ibuprofen (ADVIL,MOTRIN) 200 MG tablet Take 400 mg by mouth every 6 (six) hours as needed for headache, mild pain or cramping.     insulin aspart (NOVOLOG FLEXPEN) 100 UNIT/ML FlexPen INJECT UP TO 50 UNITS SUBCUTANEOUSLY DAILY 15 mL 5   insulin degludec (TRESIBA FLEXTOUCH) 100 UNIT/ML FlexTouch Pen Inject up to 50 units per day SubQ 15 mL 6   Insulin Pen Needle (BD PEN NEEDLE NANO U/F) 32G X 4 MM MISC Use with insulin pens 6x daily 200 each 6   mupirocin ointment (BACTROBAN) 2 % Apply twice daily to underarm until bumps are gone 22 g 0   norgestimate-ethinyl estradiol (SPRINTEC  28) 0.25-35 MG-MCG tablet TAKE 1 TABLET DAILY. DISCARD PLACEBOS AND TAKE ACTIVE PILLS FOR CONTINUOUS CYCLING 112 tablet 4   prazosin (MINIPRESS) 1 MG capsule Take 1 capsule (1 mg total) by mouth at bedtime. 30 capsule 1   No current facility-administered medications on file prior to visit.    Allergies  Allergen Reactions   Penicillins Hives    Physical Exam:    Vitals:   05/18/21 1352  BP: 112/78  Pulse: (!) 130  Weight: 102 lb 6.4 oz (46.4 kg)  Height: 4' 11.45" (1.51 m)    Growth percentile SmartLinks can only be used for patients less than 52 years  old.  Physical Exam Vitals and nursing note reviewed.  Constitutional:      General: She is not in acute distress.    Appearance: She is not toxic-appearing.  HENT:     Head: Normocephalic.     Mouth/Throat:     Mouth: Mucous membranes are dry.  Cardiovascular:     Rate and Rhythm: Normal rate and regular rhythm.     Pulses: Normal pulses.  Pulmonary:     Effort: Pulmonary effort is normal.     Breath sounds: Normal breath sounds.  Musculoskeletal:        General: Normal range of motion.     Cervical back: Normal range of motion.  Skin:    General: Skin is warm and dry.     Capillary Refill: Capillary refill takes less than 2 seconds.  Neurological:     General: No focal deficit present.     Mental Status: She is alert.  Psychiatric:        Mood and Affect: Mood is anxious. Affect is tearful.    Assessment/Plan: 1. Uncontrolled type 1 diabetes mellitus with hyperglycemia (HCC) ++++ glucose and ++ ketones on dip today. No overt s/sx of DKA on exam. Disucssed necessity of taking insulin, eating and drinking water to avoid hospitalization. Labs today to assess further.  - Lipid Profile - POCT Glucose (CBG)  2. Acute stress reaction D/c hydroxyzine. Increase prazosin at bedtime.   3. Generalized anxiety disorder Continue lexapro 15 mg and wellbutrin xl 300 mg daily. Could consider mirtazapine in the PM    4. Weight loss As above. Will enlist friend she is living with for help with meal planning and accountability for eating.  - Comprehensive metabolic panel - Magnesium - Phosphorus - TSH + free T4  Return in 1 week or sooner as needed.   Alfonso Ramus, FNP  Level of Service: This visit lasted in excess of 40 minutes. More than 50% of the visit was devoted to counseling regarding current severity of condition r/t diabetes and mental health.

## 2021-05-18 NOTE — Patient Instructions (Addendum)
Starting trying to eat at least breakfast and dinner and taking insulin at those times with food  Make sure you are getting your night time insulin in a different spot each night  If you start vomiting, breathing fast, having dizziness, weakness or other signs of DKA, please go right to the ER  Increase prazosin to 2 mg  Stop hydroxyzine

## 2021-05-19 ENCOUNTER — Encounter (INDEPENDENT_AMBULATORY_CARE_PROVIDER_SITE_OTHER): Payer: Self-pay

## 2021-05-19 ENCOUNTER — Ambulatory Visit (INDEPENDENT_AMBULATORY_CARE_PROVIDER_SITE_OTHER): Payer: BC Managed Care – PPO | Admitting: Pharmacist

## 2021-05-19 DIAGNOSIS — E1065 Type 1 diabetes mellitus with hyperglycemia: Secondary | ICD-10-CM

## 2021-05-19 LAB — COMPREHENSIVE METABOLIC PANEL
AG Ratio: 1.6 (calc) (ref 1.0–2.5)
ALT: 25 U/L (ref 6–29)
AST: 20 U/L (ref 10–30)
Albumin: 5 g/dL (ref 3.6–5.1)
Alkaline phosphatase (APISO): 98 U/L (ref 31–125)
BUN: 11 mg/dL (ref 7–25)
CO2: 29 mmol/L (ref 20–32)
Calcium: 10.4 mg/dL — ABNORMAL HIGH (ref 8.6–10.2)
Chloride: 94 mmol/L — ABNORMAL LOW (ref 98–110)
Creat: 0.69 mg/dL (ref 0.50–0.96)
Globulin: 3.2 g/dL (calc) (ref 1.9–3.7)
Glucose, Bld: 106 mg/dL — ABNORMAL HIGH (ref 65–99)
Potassium: 3.7 mmol/L (ref 3.5–5.3)
Sodium: 137 mmol/L (ref 135–146)
Total Bilirubin: 0.6 mg/dL (ref 0.2–1.2)
Total Protein: 8.2 g/dL — ABNORMAL HIGH (ref 6.1–8.1)

## 2021-05-19 LAB — LIPID PANEL
Cholesterol: 329 mg/dL — ABNORMAL HIGH (ref <200)
HDL: 138 mg/dL (ref >=50)
LDL Cholesterol (Calc): 155 mg/dL (calc) — ABNORMAL HIGH
Non-HDL Cholesterol (Calc): 191 mg/dL (calc) — ABNORMAL HIGH (ref <130)
Total CHOL/HDL Ratio: 2.4 (calc) (ref <5.0)
Triglycerides: 196 mg/dL — ABNORMAL HIGH (ref <150)

## 2021-05-19 LAB — PHOSPHORUS: Phosphorus: 3.8 mg/dL (ref 2.7–5.0)

## 2021-05-19 LAB — MAGNESIUM: Magnesium: 1.9 mg/dL (ref 1.5–2.5)

## 2021-05-19 LAB — TSH+FREE T4: TSH W/REFLEX TO FT4: 0.84 mIU/L

## 2021-05-19 NOTE — Progress Notes (Addendum)
This is a Pediatric Specialist virtual follow up consult provided via telephone. Kelli Davis consented to an telephone visit consult today.  Location of patient: Kelli Davis is at home. Location of provider: Zachery Conch, PharmD, BCACP, CDCES, CPP is at office.   I connected with Kelli Davis's on 05/19/2021 by telephone and verified that I am speaking with the correct person using two identifiers. She reports adherence to [place] 33 units daily. She has been sleeping a lot so has not been taking Novolog consistently, but reports there are days she takes Novolog 3x/day. Her blood sugars have been running in 200-300s.  She has not been wearing her Dexcom between 05/09/21 - today (05/19/21) as she had issues with prior Dexcoms falling off. She has contact Dexcom customer support on 05/10/21 for a replacement and still has not received the replacement. Patient's was not synched to [employer].  DM medications Basal Insulin: Tresiba 33 units daily Bolus Insulin: Novolog ICR 1:5, ISF 1:30, target BG 120 during day (ICR 1:5, ISF 1:50, target BG 150 during night)  Dexcom G6 CGM Report   Assessment Patient is experiencing significant hyperglycemia throughout entire day. Upon reviewing Dexcom Clarity it appears patient is taking Novolog ~1x/day and it is not clear how often she is taking [place]. Since there is a discrepency in my review and what patient is reporting will have patient log Novolog doses in Dexcom G6 CGM app to better assess insulin doses. Will provide Dexcom G6 CGM sample and will contact Dexcom rep Kelli Davis) about pt not receiving Dexcom. Will provide patient Dexcom G6 CGM sample so she is able to restart wearing Dexcom G6 CGM. Synched patient to [employer]. Continue all insulin doses for now. Follow up in 1 week.  Plan Continue Tresiba 33 units daily Continue Novolog ICR 1:5, ISF 1:30, target BG 120 during day (ICR 1:5, ISF 1:50, target BG 150 during night) Log ALL Novolog  doses in Dexcom G6 app Restart Dexcom G6 CGM Kelli Davis has a diagnosis of diabetes, checks blood glucose readings > 4x per day, treats with > 3 insulin injections, and requires frequent adjustments to insulin regimen. This patient will be seen every six months, minimally, to assess adherence to their CGM regimen and diabetes treatment plan. Synched Dexcom Clarity  Follow up in ~1 week   This appointment required 15 minutes of patient care (this includes precharting, chart review, review of results, virtual care, etc.).  Time spent between 05/15/2021 - 06/14/2021: 15 minutes  -05/19/21: 15 minutes   Thank you for involving clinical pharmacist/diabetes educator to assist in providing this patient's care.   Zachery Conch, PharmD, BCACP, CDCES, CPP   I have reviewed the following documentation and am in agreeance with the plan. I was immediately available to the clinical pharmacist for questions and collaboration.  Gretchen Short,  FNP-C  Pediatric Specialist  [address]  [place], [address]  Tele: [phone]

## 2021-05-21 ENCOUNTER — Other Ambulatory Visit: Payer: Self-pay

## 2021-05-21 ENCOUNTER — Ambulatory Visit: Payer: BC Managed Care – PPO | Admitting: Licensed Clinical Social Worker

## 2021-05-21 DIAGNOSIS — F4323 Adjustment disorder with mixed anxiety and depressed mood: Secondary | ICD-10-CM | POA: Diagnosis not present

## 2021-05-22 NOTE — BH Specialist Note (Cosign Needed)
Integrated Behavioral Health Follow Up In-Person Visit  MRN: [id number] Name: Kelli Davis  Number of Integrated Behavioral Health Clinician visits: 4/6 Session Start time: 11:39 AM  Session End time: 12:38 PM Total time:  59  minutes  Types of Service: Individual psychotherapy  Interpretor:No. Interpretor Name and Language: n/a  Subjective: Kelli Davis is a 20 y.o. female accompanied by  self Patient was referred by Beatriz Stallion FNP for grief. Patient reports the following symptoms/concerns: continued concerns with sleep, continued grief following traumatic loss Duration of problem: weeks; Severity of problem: severe  Objective: Mood: Depressed and Affect: Appropriate and Tearful Risk of harm to self or others: No plan to harm self or others  Life Context: Family and Social: Lives with mother, hoping to move soon, spends times with friends School/Work: Works at [employer] and has been continuing to work since last appointment, reported it being a positive distraction  Self-Care: Setting boundaries, spending time with friends  Life Changes: Got social security card and is taking steps to get license, recent medication changes, planning grandfather's funeral   Patient and/or Family's Strengths/Protective Factors: Social connections, Social and Emotional competence, Concrete supports in place (healthy food, safe environments, etc.), and Sense of purpose, knowledge and use of positive coping skills, history of counseling, improvements in help-seeking behaviors   Goals Addressed: Patient will: Demonstrate ability to: Increase healthy adjustment to current life circumstances through being gentle with herself, continuing to attempt to care for self, and allowing other's to support her   Progress towards Goals: Ongoing  Interventions: Interventions utilized:  Psychoeducation and/or Health Education and Supportive Reflection Standardized Assessments completed: Not  Needed  Patient and/or Family Response: Patient reported continuing to focus on caring for self and continued improvements with boundary setting. Patient worked to process emotions related to recent losses and relationship with parents. Patient worked to create plan to continue to care for self.   Patient Centered Plan: Patient is on the following Treatment Plan(s): Grief  Assessment: Patient currently experiencing grief and adjustments.   Patient may benefit from ongoing outpatient counseling to continue to process grief and improve coping.  Plan: Follow up with behavioral health clinician on : No follow up scheduled as patient has appointment with counselor in the community next week  Behavioral recommendations: Continue to practice healthy boundaries and seek help when needed. Focus on caring for body  Referral(s):  none "From scale of 1-10, how likely are you to follow plan?": Patient agreeable to above plan   Carleene Overlie, [facility]

## 2021-05-25 ENCOUNTER — Other Ambulatory Visit: Payer: Self-pay

## 2021-05-25 ENCOUNTER — Encounter: Payer: Self-pay | Admitting: Pediatrics

## 2021-05-25 ENCOUNTER — Ambulatory Visit: Payer: BC Managed Care – PPO | Admitting: Pediatrics

## 2021-05-25 VITALS — BP 108/77 | HR 96 | Ht 59.84 in | Wt 107.4 lb

## 2021-05-25 DIAGNOSIS — Z1389 Encounter for screening for other disorder: Secondary | ICD-10-CM

## 2021-05-25 DIAGNOSIS — F430 Acute stress reaction: Secondary | ICD-10-CM

## 2021-05-25 DIAGNOSIS — E1065 Type 1 diabetes mellitus with hyperglycemia: Secondary | ICD-10-CM

## 2021-05-25 DIAGNOSIS — F411 Generalized anxiety disorder: Secondary | ICD-10-CM

## 2021-05-25 DIAGNOSIS — F321 Major depressive disorder, single episode, moderate: Secondary | ICD-10-CM | POA: Diagnosis not present

## 2021-05-25 LAB — POCT URINALYSIS DIPSTICK
Bilirubin, UA: NEGATIVE
Blood, UA: NEGATIVE
Glucose, UA: POSITIVE — AB
Ketones, UA: NEGATIVE
Leukocytes, UA: NEGATIVE
Nitrite, UA: NEGATIVE
Protein, UA: NEGATIVE
Spec Grav, UA: 1.015 (ref 1.010–1.025)
Urobilinogen, UA: NEGATIVE E.U./dL — AB
pH, UA: 5 (ref 5.0–8.0)

## 2021-05-25 NOTE — Progress Notes (Addendum)
S:     Chief Complaint  Patient presents with   Diabetes    Medication management    This is a Pediatric Specialist virtual follow up consult provided via telephone. Kinsleigh Ogren consented to an telephone visit consult today.  Location of patient: Jolaine Fryberger is at home. Location of provider: Arnette Felts, PharmD, PGY1 Pharmacy Resident; Zachery Conch, PharmD, BCACP, CDCES, CPP is at office.   Endocrinology provider: Gretchen Short, FNC-P (upcoming visit 06/10/21 9:00 am)  Patient referred for closer DM follow-up. At prior visit with the pharmacy team, patient reported sleeping a lot and not taking Novolog shots consistently. Additionally, patient had not been wearing Dexcom G6 CGM due to them falling off. Patient was provided with sample to pick up at office and contact number for Dexcom rep for replacement sensors. No insulin dose changes done at prior visit. Patient instructed to log Novolog doses on Dexcom app.  I connected with Suesan Fricke's on 05/26/2021 by telephone and verified that I am speaking with the correct person using two identifiers. She reports overall doing well. She reports it has been a busy week as her grandfather passed away recently and she has been helping plan the funeral. She reports that she received new Dexcom and put it on (per Dexcom Clarity report, started 10/7), however it failed last night. She reports error message saying "Dexcom failed, change immediately." She will be able to pick up 3 new sensors from pharmacy on 10/15. She reports that she feels her Dexcom has been giving false readings--will give readings of High (BG >400) but on fingersticks she is reading 200's. Wears her Dexcom on her stomach.   She reports currently using Tresiba 32 units daily and using Novolog 100/20/5. She reports being adherent to medicine with 1 missed dose of Tresiba on 10/8 since we last spoke. She has recorded the doses of insulin as instructed per last phone  call visit.    Diabetes Diagnosis: 2006  DM medications Basal Insulin: Tresiba 33 units daily Bolus Insulin: Novolog ICR 1:5, ISF 1:20, target BG 100 during day  Insurance Coverage: BCBS  Preferred Pharmacy [id number] - [place], [place] - [address]  [address], [place] [address]  Phone:  [phone]  Fax:  [phone]  DEA #:  [id number]  DAW Reason: --     O:  Insulin Injections: Date Tresiba (units) Novolog (units)  10/5 32 6, 2, 8  10/6 32 13, 4  10/7 32 3  10/8 Missed 7, 5, 12  10/9 32 5, 14  10/10 32 16  10/11 32 5, 10, 7   Average Total daily dose insulin: 44.14 units   Labs:   Dexcom Clarity Report      There were no vitals filed for this visit.  HbA1c Lab Results  Component Value Date   HGBA1C 10.4 (A) 04/29/2021   HGBA1C 7.4 (A) 09/30/2020   HGBA1C 8.2 (H) 05/04/2020     Microalbumin Lab Results  Component Value Date   MICRALBCREAT 15 10/07/2019    Lipids    Component Value Date/Time   CHOL 329 (H) 05/18/2021 1519   TRIG 196 (H) 05/18/2021 1519   HDL 138 05/18/2021 1519   CHOLHDL 2.4 05/18/2021 1519   VLDL 38 05/16/2016 1946   LDLCALC 155 (H) 05/18/2021 1519    Assessment: Per Dexcom Clarity report, TIR 6% and below goal < 70%. However, patient reports that Dexcom not reading appropriately and fingersticks readings have been in the 200's (  which correlate to most current A1c of 10.4 on 04/29/2021) therefore it is unclear what her TIR may be given this discrepancy. Thanked patient for keeping log of insulin injections as requested. Patient adherent to [place] with only 1 missed dose. Patient with improved adherence to Novolog doses given that she is giving on average 2-3 shots of Novolog/day. Congratulated patient on this and encouraged patient to continue with goal of 3 Novolog shots per day. Encouraged and asked patient to log times of Novolog injections to better assess at next visit insulin needs. Patient currently  using Novolog 100/20/5 plan although previous notes from Gretchen Short, NP, from last office visit on 05/13/21, state she is currently on Novolog 120/30/5 plan. Will send updated charts to patient via My Chart. No changes to insulin will be made today. Given that Dexcom sensor failed, will provide patient with sample to be picked up in office today and patient will plan to pick up new RX on 10/15. Asked patient to also log BG if Dexcom readings are not reflective of fingerstick. Will plan for close follow-up via phone call in 2 days.   Plan: Medications:  Continue Tresiba 32 units daily.  Change to Novolog 120/30/5 unit plan. Updated chart below sent to patient via My Chart Message.  Encouraged patient to log time and amount of insulin given via Notes phone app or Dexcom app--whichever is easiest for her.  Monitoring:  Continue wearing Dexcom G6 CGM - will provide patient sample to be picked up at front desk today. Patient planning to pick up new RX on 10/15. Asked patient to log BG if she notices large discrepancy between Dexcom and fingerstick. Asked patient to contact Dr. Zachery Conch if Dexcom fails again so that Dexcom Rep may be contacted as well.  Makinze Jani has a diagnosis of diabetes, checks blood glucose readings > 4x per day, treats with > 3 insulin injections or wears an insulin pump, and requires frequent adjustments to insulin regimen. This patient will be seen every six months, minimally, to assess adherence to their CGM regimen and diabetes treatment plan. Follow Up: Adherence phone call in 2 days (10/14) by pharmacy resident   This appointment required 45 minutes of patient care (this includes precharting, chart review, review of results, etc.).  Arnette Felts, PharmD PGY1 Ambulatory Care Pharmacy Resident 05/26/2021 10:37 AM  The pharmacy resident and I have discussed this patient's care and are in agreeance with the plan. I have reviewed the documentation as well. I was  immediately available to the pharmacy resident for questions and collaboration.  Thank you for involving clinical pharmacist/diabetes educator to assist in providing this patient's care.   Zachery Conch, PharmD, BCACP, CDCES, CPP   [facility]  [address], [address] [place], [place] [address] Telephone [phone]     Fax [phone]         Rapid-Acting Insulin Instructions (Novolog/Humalog/Apidra) (Target blood sugar 120, Insulin Sensitivity Factor 30, Insulin to Carbohydrate Ratio 1 unit for 5g)   SECTION A (Meals): 1. At mealtimes, take rapid-acting insulin according to this "Two-Component Method".  a. Measure Fingerstick Blood Glucose (or use reading on continuous glucose monitor) 0-15 minutes prior to the meal. Use the "Correction Dose Table" below to determine the dose of rapid-acting insulin needed to bring your blood sugar down to a baseline of 120. You can also calculate this dose with the following equation: (Blood sugar - target blood sugar) divided by 30.  Correction Dose Table  Blood Sugar Range Units of Insulin   0 to 119 0  120 to 149 1  150 to 179 2  180 to 209 3  210 to 239 4  240 to 269 5  270 to 299 6  300 to 329 7  330 to 359 8  360 to 389 9  390 to 419 10  420 to 449 11  450 to 479 12  480 to 509 13  510 to 539 14  540 to 569 15  570 to 599 16    600 or greater 17   b. Estimate the number of grams of carbohydrates you will be eating (carb count). Use the "Food Dose Table" below to determine the dose of rapid-acting insulin needed to cover the carbs in the meal. You can also calculate this dose using this formula: Total carbs divided by 5.  Food Dose Table Carb  Factor Range Units of Insulin  1 to 5 1  6  to 10 2  11  to 15 3  16  to 20 4  21  to 25 5  26  to 30 6  31  to 35 7  36 to 40 8  41 to 45 9  46 to 50 10  51 to 55 11  56 to 60 12  61 to 65 13  66 to 70 14  71 to 75 15  76 to 80 16  81 to 85 17  86  to 90 18  91 to 95 19  96 to 100 20  101 to 105 21  106 to 110 22  111 to 115 23  116 to 120 24  121 to 125 25  126 to 130 26  131 to 135 27  136 to 140 28  141 to 145 29  146 to 150 30    c. Add up the Correction Dose plus the Food Dose = "Total Dose" of rapid-acting insulin to be taken. d. If you know the number of carbs you will eat, take the rapid-acting insulin 0-15 minutes prior to the meal; otherwise take the insulin immediately after the meal.   SECTION B (Bedtime/2AM): 1. Wait at least 2.5-3 hours after taking your supper rapid-acting insulin before you do your bedtime blood sugar test. If the blood sugar at bedtime is above 180, you will need to take additional rapid-acting insulin based on the Bedtime Sliding Scale Dose Table below.  Bedtime Sliding Scale Dose Table Blood Sugar Range Units of Insulin   0 to 179 0  180 to 209 1  210 to 239 2  240 to 269 3  270 to 299 4  300 to 329 5  330 to 359 6  360 to 389 7  390 to 419 8  420 to 449 9  450 to 479 10  480 to 509 11  510 to 539 12  540 to 569 13  570 to 599 14    600 or greater 15   3. Then take your usual dose of long-acting insulin (Lantus, Basaglar, ).  4. If we ask you to check your blood sugar in the middle of the night (2AM-3AM), you should wait at least 3 hours after your last rapid-acting insulin dose before you check the blood sugar.  You will then use the Bedtime Sliding Scale Dose Table to give additional units of rapid-acting insulin if blood sugar is above 200. This may be especially necessary in times of sickness, when the illness may cause more resistance  to insulin and higher blood sugar than usual.  Molli Knock, MD, CDE Signature: _____________________________________ Dessa Phi, MD   Judene Companion, MD    Gretchen Short, NP  Date: ______________   I have reviewed the following documentation and am in agreeance with the plan. I was immediately available to the clinical  pharmacist for questions and collaboration. Gretchen Short,  FNP-C  Pediatric Specialist  [address]  [place], [address]  Tele: [phone]

## 2021-05-25 NOTE — Progress Notes (Signed)
History was provided by the patient.  Kelli Davis is a 20 y.o. female who is here for T1DM, grief, anxiety, depression, nightmares.  Alfonso Ramus T, FNP   HPI:  Pt reports she has been pretty good. She is still working at [employer].   Sleep has been a lot better with change in prazosin. She is able to go to sleep- some overnight awakenings but able to fall back asleep.   Anxiety has been ok- if panic attacks happen it's usually when she is out and will bottle it up until the night. Will take hydroxyzine which helps some.   Eating has been pretty good- talked to her friend who is helping her eat better.   Has been giving herself bigger short acting insulin doses during the day. Some snacks at night that are higher sugar (caramel apples) - not sure how much to correct for at the time.   Seeing a new therapist Thursday- Mike Craze with [facility].   SS card came in so she will be able to go to the [facility] next week and get ID.   No LMP recorded. (Menstrual status: Oral contraceptives).   Patient Active Problem List   Diagnosis Date Noted   Vitamin D deficiency 05/05/2020   Insulin dose changed (HCC) 09/03/2018   Mixed hyperlipidemia 09/03/2018   Generalized anxiety disorder 09/04/2017   Oral contraceptive pill surveillance 09/04/2017   Eating disorder 05/16/2016   Insomnia 04/12/2016   Adjustment disorder with mixed anxiety and depressed mood 10/08/2015   ADHD (attention deficit hyperactivity disorder), combined type 05/20/2015   Maladaptive health behaviors affecting medical condition 10/17/2013   Hyperglycemia 07/11/2011   Hypoglycemia associated with diabetes (HCC)    Uncontrolled type 1 diabetes mellitus with hyperglycemia (HCC) 12/13/2010    Current Outpatient Medications on File Prior to Visit  Medication Sig Dispense Refill   ACCU-CHEK FASTCLIX LANCETS MISC Check sugar 10 x daily 300 each 3   buPROPion (WELLBUTRIN XL) 300 MG 24 hr tablet Take 1  tablet (300 mg total) by mouth daily. 30 tablet 0   Continuous Blood Gluc Sensor (DEXCOM G6 SENSOR) MISC 1 APPLICATION BY DOES NOT APPLY ROUTE AS NEEDED. 3 each 5   Continuous Blood Gluc Transmit (DEXCOM G6 TRANSMITTER) MISC 1 application by Does not apply route continuous as needed. 1 each 3   escitalopram (LEXAPRO) 10 MG tablet TAKE 1 AND 1/2 TABLETS DAILY BY MOUTH 135 tablet 1   fluticasone (FLONASE) 50 MCG/ACT nasal spray SPRAY 1 SPRAY INTO EACH NOSTRIL EVERY DAY     glucose blood test strip Use as instructed 300 each 3   hydrOXYzine (ATARAX/VISTARIL) 10 MG tablet Take 1 tablet (10 mg total) by mouth 3 (three) times daily as needed. 30 tablet 3   ibuprofen (ADVIL,MOTRIN) 200 MG tablet Take 400 mg by mouth every 6 (six) hours as needed for headache, mild pain or cramping.     insulin aspart (NOVOLOG FLEXPEN) 100 UNIT/ML FlexPen INJECT UP TO 50 UNITS SUBCUTANEOUSLY DAILY 15 mL 5   insulin degludec (TRESIBA FLEXTOUCH) 100 UNIT/ML FlexTouch Pen Inject up to 50 units per day SubQ 15 mL 6   Insulin Pen Needle (BD PEN NEEDLE NANO U/F) 32G X 4 MM MISC Use with insulin pens 6x daily 200 each 6   mupirocin ointment (BACTROBAN) 2 % Apply twice daily to underarm until bumps are gone 22 g 0   norgestimate-ethinyl estradiol (SPRINTEC 28) 0.25-35 MG-MCG tablet TAKE 1 TABLET DAILY. DISCARD PLACEBOS AND TAKE ACTIVE PILLS  FOR CONTINUOUS CYCLING 112 tablet 4   prazosin (MINIPRESS) 2 MG capsule Take 1 capsule (2 mg total) by mouth at bedtime. 30 capsule 3   Glucagon (BAQSIMI ONE PACK) 3 MG/DOSE POWD Place 1 Dose into the nose as needed. (Patient not taking: No sig reported) 2 each 1   No current facility-administered medications on file prior to visit.    Allergies  Allergen Reactions   Penicillins Hives    Physical Exam:    Vitals:   05/25/21 0950  BP: 108/77  Pulse: 96  Weight: 107 lb 6.4 oz (48.7 kg)  Height: 4' 11.84" (1.52 m)    Growth percentile SmartLinks can only be used for patients less  than 17 years old.  Physical Exam Vitals and nursing note reviewed.  Constitutional:      General: She is not in acute distress.    Appearance: She is well-developed.  Neck:     Thyroid: No thyromegaly.  Cardiovascular:     Rate and Rhythm: Normal rate and regular rhythm.     Heart sounds: No murmur heard. Pulmonary:     Breath sounds: Normal breath sounds.  Abdominal:     Palpations: Abdomen is soft. There is no mass.     Tenderness: There is no abdominal tenderness. There is no guarding.  Musculoskeletal:     Right lower leg: No edema.     Left lower leg: No edema.  Lymphadenopathy:     Cervical: No cervical adenopathy.  Skin:    General: Skin is warm.     Findings: No rash.  Neurological:     Mental Status: She is alert.     Comments: No tremor  Psychiatric:        Mood and Affect: Mood and affect normal.     Comments: Well groomed and well dressed today, completed appointment without becoming tearful    Assessment/Plan: 1. Current moderate episode of major depressive disorder without prior episode [facility]) Currently improving with improved self care. She is meeting with new therapist this week.   2. Uncontrolled type 1 diabetes mellitus with hyperglycemia (HCC) No ketones today,taking insulin more frequently and using dexcom again. Discussed caramel apples and measuring out caramel so she can get carb correction right.   3. Generalized anxiety disorder Continue lexapro and wellbutrin.   4. Acute stress reaction Improving. Starts with new therapist this week.   5. Screening for genitourinary condition No ketones today.  - POCT urinalysis dipstick  Return in 3 weeks or sooner as needed.   Alfonso Ramus, FNP

## 2021-05-26 ENCOUNTER — Encounter (INDEPENDENT_AMBULATORY_CARE_PROVIDER_SITE_OTHER): Payer: Self-pay

## 2021-05-26 ENCOUNTER — Other Ambulatory Visit: Payer: Self-pay | Admitting: Family

## 2021-05-26 ENCOUNTER — Ambulatory Visit (INDEPENDENT_AMBULATORY_CARE_PROVIDER_SITE_OTHER): Payer: BC Managed Care – PPO | Admitting: Pharmacist

## 2021-05-26 DIAGNOSIS — F4323 Adjustment disorder with mixed anxiety and depressed mood: Secondary | ICD-10-CM

## 2021-05-26 DIAGNOSIS — E1065 Type 1 diabetes mellitus with hyperglycemia: Secondary | ICD-10-CM

## 2021-05-28 ENCOUNTER — Telehealth (INDEPENDENT_AMBULATORY_CARE_PROVIDER_SITE_OTHER): Payer: Self-pay | Admitting: Pharmacist

## 2021-05-28 ENCOUNTER — Encounter (INDEPENDENT_AMBULATORY_CARE_PROVIDER_SITE_OTHER): Payer: Self-pay

## 2021-05-28 NOTE — Telephone Encounter (Signed)
Attempted to call patient to discuss Dexcom, Novolog injections, and blood sugar readings per last phone call visit with clinical pharmacist on 05/26/2021. Left VM for patient to call back. Will send My Chart Message to check-in that patient was able to pick-up Dexcom sample and that there are no other challenges.   Thank you for involving pharmacy in this patient's care.  Arnette Felts, PharmD PGY1 Ambulatory Care Pharmacy Resident 05/28/2021 9:18 AM

## 2021-06-01 ENCOUNTER — Ambulatory Visit (INDEPENDENT_AMBULATORY_CARE_PROVIDER_SITE_OTHER): Payer: BC Managed Care – PPO | Admitting: Pharmacist

## 2021-06-01 ENCOUNTER — Telehealth (INDEPENDENT_AMBULATORY_CARE_PROVIDER_SITE_OTHER): Payer: Self-pay

## 2021-06-01 DIAGNOSIS — E1065 Type 1 diabetes mellitus with hyperglycemia: Secondary | ICD-10-CM

## 2021-06-01 NOTE — Progress Notes (Addendum)
S:     Chief Complaint  Patient presents with   Medication Management    Diabetes     This is a Pediatric Specialist virtual follow up consult provided via telephone. Kelli Davis consented to an telephone visit consult today.  Location of patient: Kelli Davis is at home. Location of provider: Arnette Felts, PharmD, PGY1 Pharmacy Resident; Zachery Conch, PharmD, BCACP, CDCES, CPP is at office.   Endocrinology provider: Gretchen Short, FNC-P (upcoming visit 06/10/21 9:00 am)  Patient referred for closer DM follow-up. At prior visit with the pharmacy team, patient was instructed to log insulin shots on Dexcom app. No insulin changes were made at the time.   I connected with Kelli Davis's on 06/01/2021 by telephone and verified that I am speaking with the correct person using two identifiers. She reports overall doing well. She reports that she's doing okay. She feels that her BG are going up and down. Sugar was high last night, gave herself 10 units Novolog and then waited an hour to give [place]. She reports that she keeps forgetting to give meal time insulin before eating and will do it halfway through eating. She has been recording insulin shots on her Dexcom app.   Diabetes Diagnosis: 2006  DM medications Basal Insulin: Tresiba 32 units daily Bolus Insulin: Novolog ICR 1:5, ISF 1:20, target BG 100 during day  Insurance Coverage: [facility]  Preferred Pharmacy [id number] - [place], [place] - [address]  [address], [place] [address]  Phone:  [phone]  Fax:  [phone]  DEA #:  [id number]  DAW Reason: --     O:   Labs:   Dexcom Clarity Report         There were no vitals filed for this visit.  HbA1c Lab Results  Component Value Date   HGBA1C 10.4 (A) 04/29/2021   HGBA1C 7.4 (A) 09/30/2020   HGBA1C 8.2 (H) 05/04/2020     Microalbumin Lab Results  Component Value Date   MICRALBCREAT 15 10/07/2019    Lipids     Component Value Date/Time   CHOL 329 (H) 05/18/2021 1519   TRIG 196 (H) 05/18/2021 1519   HDL 138 05/18/2021 1519   CHOLHDL 2.4 05/18/2021 1519   VLDL 38 05/16/2016 1946   LDLCALC 155 (H) 05/18/2021 1519    Assessment: Per Dexcom Clarity report, TIR 22% and much improved but below goal < 70%. Some episodes of hypoglycemia, especially one where patient's BG dropped sugar very quickly from 400 to 60's after patient gave both Novolog and Tresiba at the same time. Considering multiple episodes of hypoglycemia and significant decrease in BG overnight, will decrease Lantus to 28 units daily. Encouraged patient for logging insulin injections in Dexcom app. Discussed with patient that she should be giving Novolog dosed on blood sugar before meals to avoid giving too much mealtime insulin. Instructed patient to use BG prior to meals when calculating bolus dose. If patient keeps forgetting to bolus prior to eating, she may be a good candidate for switching to Fiasp insulin considering it has a faster onset of action compared to Novolog. Continue wearing Dexcom G6 daily. Will plan for follow-up via phone call in 7 days.   Plan: Medications:  Decrease Tresiba 32 units daily --> 28 units daily.  Continue Novolog 120/30/5 unit plan.   Encouraged patient to log time and amount of insulin given via Notes phone app or Dexcom app--whichever is easiest for her.  Monitoring:  Continue wearing Dexcom G6  CGM  Kelli Davis has a diagnosis of diabetes, checks blood glucose readings > 4x per day, treats with > 3 insulin injections or wears an insulin pump, and requires frequent adjustments to insulin regimen. This patient will be seen every six months, minimally, to assess adherence to their CGM regimen and diabetes treatment plan. Follow Up: Phone call in 7 days (10/25) by pharmacy team. Follow-up appointment with Kelli Cook, FNP-C 06/10/21  This appointment required 15 minutes of patient care (this includes  precharting, chart review, review of results, etc.).  Arnette Felts, PharmD PGY1 Ambulatory Care Pharmacy Resident 06/02/2021 4:44 PM  The pharmacy resident and I have discussed this patient's care and are in agreeance with the plan. I have reviewed the documentation as well. I was immediately available to the pharmacy resident for questions and collaboration.  Thank you for involving clinical pharmacist/diabetes educator to assist in providing this patient's care.   Zachery Conch, PharmD, BCACP, CDCES, CPP  I have reviewed the following documentation and am in agreeance with the plan. I was immediately available to the clinical pharmacist for questions and collaboration.  Gretchen Short,  FNP-C  Pediatric Specialist  [address]  [place], [address]  Tele: [phone]

## 2021-06-01 NOTE — Telephone Encounter (Signed)
Called patient to let her know that Dr. Ladona Ridgel is running behind, she stated that is fine.  I asked if there was anything I should relay to Dr. Ladona Ridgel, she stated No, she can call me when she is ready.

## 2021-06-02 ENCOUNTER — Other Ambulatory Visit: Payer: Self-pay

## 2021-06-03 ENCOUNTER — Other Ambulatory Visit: Payer: Self-pay | Admitting: Pediatrics

## 2021-06-03 DIAGNOSIS — F430 Acute stress reaction: Secondary | ICD-10-CM

## 2021-06-03 DIAGNOSIS — F515 Nightmare disorder: Secondary | ICD-10-CM

## 2021-06-08 ENCOUNTER — Other Ambulatory Visit: Payer: Self-pay

## 2021-06-08 ENCOUNTER — Ambulatory Visit (INDEPENDENT_AMBULATORY_CARE_PROVIDER_SITE_OTHER): Payer: BC Managed Care – PPO | Admitting: Pharmacist

## 2021-06-08 DIAGNOSIS — E1065 Type 1 diabetes mellitus with hyperglycemia: Secondary | ICD-10-CM

## 2021-06-08 NOTE — Progress Notes (Addendum)
S:     Chief Complaint  Patient presents with   Diabetes    Medication Management     This is a Pediatric Specialist virtual follow up consult provided via telephone. Kelli Davis consented to an telephone visit consult today.  Location of patient: Kelli Davis is at home. Location of provider: Arnette Felts, PharmD, PGY1 Pharmacy Resident; Zachery Conch, PharmD, BCACP, CDCES, CPP is at office.   Endocrinology provider: Gretchen Short, FNC-P (upcoming visit 06/10/21 9:00 am)  Patient referred for closer DM follow-up. At prior visit with the pharmacy team, patient was instructed to log insulin shots on Dexcom app. At last visit, Lantus was decreased to 28 units daily due to concern for nighttime hypoglycemia. Patient was instructed to try and give bolus before meals. If patient remembered to bolus while eating, she was counseled to use the blood sugar prior to eating for correction dose.   I connected with Kelli Davis's on 06/08/2021 by telephone and verified that I am speaking with the correct person using two identifiers. Patient reports being okay, trying to find a new place to live and finding a new job. Reports mom wants her out of the house by the end of month. Feels that blood sugars are all over the place. Recently got her menstrual period and increased her Tresiba to 30 units. Had an issue with [facility] and [facility]. Will be putting the Dexcom in later today. She would like to work on her schedule and meal plan.   Diabetes Diagnosis: 2006  DM medications Basal Insulin: Tresiba 28 units daily --> self-increased to 30 units due to hyperglycemia attributed to her menstrual cycle (decreased from 32 units on 06/01/21) Bolus Insulin: Novolog ICR 1:5, ISF 1:20, target BG 100 during day  Insurance Coverage: [facility]  Preferred [facility]acy [id number] - [place], [place] - [address]  [address], [place] [address]  Phone:  [phone]  Fax:  [phone]  DEA #:   [id number]  DAW Reason: --     O:   Labs:   Dexcom Clarity Report      HbA1c Lab Results  Component Value Date   HGBA1C 10.4 (A) 04/29/2021   HGBA1C 7.4 (A) 09/30/2020   HGBA1C 8.2 (H) 05/04/2020     Microalbumin Lab Results  Component Value Date   MICRALBCREAT 15 10/07/2019    Lipids    Component Value Date/Time   CHOL 329 (H) 05/18/2021 1519   TRIG 196 (H) 05/18/2021 1519   HDL 138 05/18/2021 1519   CHOLHDL 2.4 05/18/2021 1519   VLDL 38 05/16/2016 1946   LDLCALC 155 (H) 05/18/2021 1519    Assessment: Per Dexcom Clarity report, TIR 20% and improved but below goal < 70%. No hypoglycemia.  Blood sugars trending in the 300-400's on most days. Patient self-titrated Tresiba from 28 units to 30 units secondary to hyperglycemia from her menstrual period. Will continue this current dose as patient with consistent high blood sugars but was having severe hypoglycemia on 32 units daily. Will continue current Novolog dose. Patient upset she was not able to log insulin shots on Dexcom given issues with housing and new job search. Consoled patient that it was okay and encouraged her that if goal was too extensive, we could always back down to more achievable goal. Patient stated that she would still like to work on her diabetes and that she really would like try to log insulin shots. Made goal together to aim for logging 1 insulin shot a  day.  Patient had challenges with obtaining Dexcom from pharmacy but was able to get it and will plan to put one on today. Will call pharmacy to ensure prescription is correct. Continue wearing Dexcom G6 daily. Follow-up with Gretchen Short 06/10/2021.   Plan: Medications:  Continue Tresiba 30 units daily  Continue Novolog 120/30/5 unit plan   Encouraged patient to log one insulin shot a day via Notes app or Dexcom app.  Monitoring:  Continue wearing Dexcom G6 CGM  Called pharmacy and verified that Dexcom G6 sensors were billed as 3 sensors for  30 days. Pharmacy confirmed that this is how they were billed.  Jaiona Simien has a diagnosis of diabetes, checks blood glucose readings > 4x per day, treats with > 3 insulin injections or wears an insulin pump, and requires frequent adjustments to insulin regimen. This patient will be seen every six months, minimally, to assess adherence to their CGM regimen and diabetes treatment plan. Follow Up: Office visit with Luella Cook, FNP-C 06/10/21  This appointment required 25 minutes of patient care (this includes precharting, chart review, review of results, etc.).  Thank you for involving pharmacy in this patient's care.  Arnette Felts, PharmD PGY1 Ambulatory Care Pharmacy Resident 06/08/2021 3:04 PM  The pharmacy resident and I have discussed this patient's care and are in agreeance with the plan. I have reviewed the documentation as well. I was immediately available to the pharmacy resident for questions and collaboration.  Thank you for involving clinical pharmacist/diabetes educator to assist in providing this patient's care.   Zachery Conch, PharmD, BCACP, CDCES, CPP  I have reviewed the following documentation and am in agreeance with the plan. I was immediately available to the clinical pharmacist for questions and collaboration. Gretchen Short,  FNP-C  Pediatric Specialist  [address]  [place], [address]  Tele: [phone]

## 2021-06-10 ENCOUNTER — Encounter (INDEPENDENT_AMBULATORY_CARE_PROVIDER_SITE_OTHER): Payer: Self-pay

## 2021-06-10 ENCOUNTER — Encounter (INDEPENDENT_AMBULATORY_CARE_PROVIDER_SITE_OTHER): Payer: Self-pay | Admitting: Family

## 2021-06-10 ENCOUNTER — Telehealth (INDEPENDENT_AMBULATORY_CARE_PROVIDER_SITE_OTHER): Payer: BC Managed Care – PPO | Admitting: Family

## 2021-06-10 DIAGNOSIS — F419 Anxiety disorder, unspecified: Secondary | ICD-10-CM

## 2021-06-10 DIAGNOSIS — E782 Mixed hyperlipidemia: Secondary | ICD-10-CM

## 2021-06-10 DIAGNOSIS — E1065 Type 1 diabetes mellitus with hyperglycemia: Secondary | ICD-10-CM

## 2021-06-10 DIAGNOSIS — F509 Eating disorder, unspecified: Secondary | ICD-10-CM | POA: Diagnosis not present

## 2021-06-10 DIAGNOSIS — F32A Depression, unspecified: Secondary | ICD-10-CM

## 2021-06-10 NOTE — Progress Notes (Signed)
as This is a Pediatric Specialist E-Visit consult/follow up provided via My Chart Sharonlee Heidecker consented to an E-Visit consult today.  Location of patient: Olinda is at home  Location of provider: Crist Infante is at [facility] office.  Patient was referred by [name], FNP   The following participants were involved in this E-Visit: Sherolyn Buba FNP  This visit was done via VIDEO   Chief Complain/ Reason for E-Visit today: Type 1 DM follow up  Total time on call: >20  spent today reviewing the medical chart, counseling the patient/family, and documenting today's visit.   Follow up: 1 month.   Pediatric Endocrinology Diabetes Consultation Follow-up Visit  Nimrat Woolworth [birth year] [id number]  Chief Complaint: Follow-up type 1 diabetes   [name], FNP   HPI: Kelli Davis  is a 20 y.o. female presenting for follow-up of type 1 diabetes. she is accompanied to this visit by her mother and father.   1. Raeden was diagnosed with type 1 diabetes at age 56. At that time she was hospitalized at [facility] center and was in DKA. She was in the ICU for 2 days. She was initially followed by Dr. Langston Masker in [place] but transferred to this clinic after Dr. Langston Masker retired. She has been admitted in DKA two additional times since diagnosis. She has been on pump therapy since age 32.  2. Since last visit to [facility] on 04/2021, she reports she has been well.   She has been meeting with Dr. Ladona Ridgel weekly to discuss blood sugars. She realized she was frequently forgetting to take her insulin. She is taking [place] more often but estimates she forgets a few times per week, usually because she falls asleep. Forgets Novolog at times when she is busy at work. She also states she does not eat until late at night because of her work schedule. . She is looking for a new job and has moved out from living with her mother (currently living with friends but needs to find permanent  residence).   She has been following up with Violeta Gelinas, NP [facility] about once per month. She reports sleeping better without nightmares but continues to be depressed.   Insulin regimen: 28 units of Tresiba(takes 30 when on menstrual cycle) Novolog 120/30/5 Hypoglycemia: Able to feel low blood sugars.  No glucagon needed recently.  Dexcom CGm Download    Med-alert ID: Not currently wearing. Injection sites: arms and hips  Annual labs due: 04/2021 Ophthalmology due: 2019. Discussed importance of annual dilated eye exam.     3. ROS: Greater than 10 systems reviewed with pertinent positives listed in HPI, otherwise neg. Constitutional: Sleep is improving  Eyes: No changes in vision, denies blurry vision. Wears glasses.  Ears/Nose/Mouth/Throat: No difficulty swallowing. No neck pain  Cardiovascular: No palpitations, denies tachycardia  Respiratory: No increased work of breathing, No SOB  Gastrointestinal: No constipation or diarrhea. No abdominal pain Genitourinary: No nocturia, no polyuria Endocrine: No polydipsia.  No hyperpigmentation Psychiatric: Normal affect.+ anxiety and depression. Currently on Lexapro.   Past Medical History:   Past Medical History:  Diagnosis Date   ADD (attention deficit disorder)    Febrile seizure (HCC)    History of eye surgery    Hypoglycemia associated with diabetes (HCC)    Hypoglycemia associated with diabetes (HCC)    Physical growth delay    Type 1 diabetes mellitus not at goal [facility])     Medications:  Outpatient Encounter Medications as of 06/10/2021  Medication Sig   ACCU-CHEK FASTCLIX LANCETS MISC Check sugar 10 x daily   buPROPion (WELLBUTRIN XL) 300 MG 24 hr tablet TAKE 1 TABLET BY MOUTH EVERY DAY   Continuous Blood Gluc Sensor (DEXCOM G6 SENSOR) MISC 1 APPLICATION BY DOES NOT APPLY ROUTE AS NEEDED.   Continuous Blood Gluc Transmit (DEXCOM G6 TRANSMITTER) MISC 1 application by Does not apply route continuous as  needed.   escitalopram (LEXAPRO) 10 MG tablet TAKE 1 AND 1/2 TABLETS DAILY BY MOUTH   fluticasone (FLONASE) 50 MCG/ACT nasal spray SPRAY 1 SPRAY INTO EACH NOSTRIL EVERY DAY   glucose blood test strip Use as instructed   hydrOXYzine (ATARAX/VISTARIL) 10 MG tablet Take 1 tablet (10 mg total) by mouth 3 (three) times daily as needed.   ibuprofen (ADVIL,MOTRIN) 200 MG tablet Take 400 mg by mouth every 6 (six) hours as needed for headache, mild pain or cramping.   insulin aspart (NOVOLOG FLEXPEN) 100 UNIT/ML FlexPen INJECT UP TO 50 UNITS SUBCUTANEOUSLY DAILY   insulin degludec (TRESIBA FLEXTOUCH) 100 UNIT/ML FlexTouch Pen Inject up to 50 units per day SubQ   Insulin Pen Needle (BD PEN NEEDLE NANO U/F) 32G X 4 MM MISC Use with insulin pens 6x daily   mupirocin ointment (BACTROBAN) 2 % Apply twice daily to underarm until bumps are gone   norgestimate-ethinyl estradiol (SPRINTEC 28) 0.25-35 MG-MCG tablet TAKE 1 TABLET DAILY. DISCARD PLACEBOS AND TAKE ACTIVE PILLS FOR CONTINUOUS CYCLING   prazosin (MINIPRESS) 2 MG capsule Take 1 capsule (2 mg total) by mouth at bedtime.   Glucagon (BAQSIMI ONE PACK) 3 MG/DOSE POWD Place 1 Dose into the nose as needed. (Patient not taking: No sig reported)   No facility-administered encounter medications on file as of 06/10/2021.    Allergies: Allergies  Allergen Reactions   Penicillins Hives    Surgical History: Past Surgical History:  Procedure Laterality Date   EYE MUSCLE SURGERY      Family History:  Family History  Problem Relation Age of Onset   Cancer Maternal Grandmother    Hypertension Maternal Grandfather    Depression Maternal Grandfather    Diabetes Paternal Grandfather    Anxiety disorder Father    Depression Mother    Anxiety disorder Sister      Social History: Lives with: Living with friends.    Physical Exam:  There were no vitals filed for this visit.    There were no vitals taken for this visit. Body mass index: body mass  index is unknown because there is no height or weight on file. Growth percentile SmartLinks can only be used for patients less than 52 years old.  Ht Readings from Last 3 Encounters:  05/25/21 4' 11.84" (1.52 m)  05/18/21 4' 11.45" (1.51 m)  05/11/21 5' 0.24" (1.53 m)   Wt Readings from Last 3 Encounters:  05/25/21 107 lb 6.4 oz (48.7 kg)  05/18/21 102 lb 6.4 oz (46.4 kg)  05/13/21 103 lb 12.8 oz (47.1 kg)   Physical Exam  General: Well developed, well nourished female in no acute distress.  Head: Normocephalic, atraumatic.   Eyes:  Pupils equal and round. EOMI.   Sclera white.  No eye drainage.   Cardiovascular: No obvious cyanosis.  Respiratory: No increased work of breathing.   Skin: warm, dry.  No rash or lesions. Neurologic: alert and oriented, normal speech, no tremor   Labs: Results for orders placed or performed in visit on 05/25/21  POCT urinalysis dipstick  Result Value Ref Range  Color, UA yellow    Clarity, UA clear    Glucose, UA Positive (A) Negative   Bilirubin, UA neg    Ketones, UA neg    Spec Grav, UA 1.015 1.010 - 1.025   Blood, UA neg    pH, UA 5.0 5.0 - 8.0   Protein, UA Negative Negative   Urobilinogen, UA negative (A) 0.2 or 1.0 E.U./dL   Nitrite, UA neg    Leukocytes, UA Negative Negative   Appearance norm    Odor norm      Assessment/Plan: Zaydee is a 20 y.o. female with type 1 diabetes on MDI and CGM therapy. Making improvements with close monitoring by multidiscipinary team but she continues to have frequent hyperglycemia which is due to missed insulin doses. Situation is further complicated by being kicked out of her moms house and living with friends.     1-3. DM w/o complication type I, uncontrolled (HCC)/hyperglycemia/Insulin dose change  - Continu 28 units of Tresiba.  - Novolog 120/30/5 plan   - At bedtime and 2am: 1 unit for every 50 points >150 and 1 unit for every 10 grams of carbs.  - Reviewed insulin pump and CGM download.  Discussed trends and patterns.  - Rotate pump sites to prevent scar tissue.  - bolus 15 minutes prior to eating to limit blood sugar spikes.  - Reviewed carb counting and importance of accurate carb counting.  - Discussed signs and symptoms of hypoglycemia. Always have glucose available.  - POCT glucose and hemoglobin A1c  - Reviewed growth chart.    4. Disordered eating - Discussed effects of binging/purging on blood sugars.  - Encouraged follow up with RD and counseling.   5. Depression/Anxiety  - Lexapro and Wellbutrin daily  - Close follow up with [facility].  - Praise for improvements but discussed her continued struggles with depression and adherence to medication.   6. Mixed hyperlipidemia  - Take 2000 units of fish oil daily.  - Low cholesterol diet.  - Fast lipid panel twice per year.     Follow-up:   1 months. Follow up with Dr. Ladona Ridgel for blood sugar call per patient request.    When a patient is on insulin, intensive monitoring of blood glucose levels is necessary to avoid hyperglycemia and hypoglycemia. Severe hyperglycemia/hypoglycemia can lead to hospital admissions and be life threatening.     Gretchen Short,  FNP-C  Pediatric Specialist  [address]  [place], [address]  Tele: [phone]

## 2021-06-10 NOTE — Patient Instructions (Signed)
Change tresiba to 8pm. Set alarm.  Follow up in 4 weeks.

## 2021-06-17 ENCOUNTER — Ambulatory Visit: Payer: BC Managed Care – PPO | Admitting: Pediatrics

## 2021-06-17 ENCOUNTER — Other Ambulatory Visit: Payer: Self-pay

## 2021-06-17 VITALS — BP 100/64 | HR 104 | Ht 59.45 in | Wt 108.4 lb

## 2021-06-17 DIAGNOSIS — F5001 Anorexia nervosa, restricting type: Secondary | ICD-10-CM

## 2021-06-17 DIAGNOSIS — F902 Attention-deficit hyperactivity disorder, combined type: Secondary | ICD-10-CM

## 2021-06-17 DIAGNOSIS — F321 Major depressive disorder, single episode, moderate: Secondary | ICD-10-CM | POA: Diagnosis not present

## 2021-06-17 DIAGNOSIS — E1065 Type 1 diabetes mellitus with hyperglycemia: Secondary | ICD-10-CM

## 2021-06-17 DIAGNOSIS — F411 Generalized anxiety disorder: Secondary | ICD-10-CM | POA: Diagnosis not present

## 2021-06-17 NOTE — Progress Notes (Signed)
History was provided by the patient.  Kelli Davis is a 20 y.o. female who is here for follow up and mood check.  [name], FNP   HPI:  Pt reports she is doing "ok".   She went to some parties with friends on halloween and didn't really want to, but thought it was good to get out for a bit.  Has been havng a lot more anxety and nghtmares recently, feels that it is largely situational because of her living situation. She is very stressed out about moving out of her mom's house, but mom kicked her out and she also really doesn't like living there. She is moving in with friends 11/12 and is stressed about it because it has been dramatic with them, but this is is her only opton. She thinks she will be safe at this home, but the friends only like to have her around for fun tmes, not long term. She has been couch surfing for the last two weeks, and staying on the couch in the home with these friends. But they will frequently ask her not to stay there last minute. When she officially moves in on 11/12 she will have her own room, but is worried that they will not get along very well because of the times that they "kick her out".  She is not sure where she can stay this weekend. She may have to stay at her house with mom and doesn't want to do that. She says it's hard being at her moms but she does feel safe there. She hasn't slept in her bed since her boyfriend left. If she has to stay at her mom's she won't sleep in her old room.   Uses hydroxyzine only once per day now. Usually before sleep.   When stayng with friends still has the nightmares and anxiety attacks, it is worse if at [employer] house. She is having nightmares every few nights, not every night anymore. She is awake until 3am or 5am most nights. Up at 9am for work.   Still taking medication as below: Lexapro 10mg  in AM  Prazosin 2mg  at night Wellbutrin 300mg  in AM Hydroxyzine 10mg  at night  Was supposed to start wth a new  therapist but doesn't have insurance card and they requested she bring it to first visit. Has tried calling twice but hasn't been able to get ahold of anyone. Would like to do therapy. Did do therapy a few weeks after her boyfriend passed away, but didn't find it helpful at this time.   Doesn't really talk to anyone about her feelings because she feels like it makes them uncomfortable. Today is the most she has talked about it in weeks.  Hard to trust anyone to tell them all that information.   Has big group of friends, two girls she is moving in with are part of that group.   No thoughts of self harm or SI/HI. Probably wouldn't tell anyone if she had those thoughts.   Diabetes has been OK. Sugars higher the last few days. Has bene in 200's, thinks its because of stress. Working with endocrinology closely.  Still working on . No change in eating habits. Trying to eat more. Has food at the house she is not welcome at. Only eating at work.   No LMP recorded. (Menstrual status: Oral contraceptives).  Review of Systems  All other systems reviewed and are negative.  Patient Active Problem List   Diagnosis Date Noted  Current moderate episode of major depressive disorder without prior episode (HCC) 05/25/2021   Vitamin D deficiency 05/05/2020   Insulin dose changed (HCC) 09/03/2018   Mixed hyperlipidemia 09/03/2018   Generalized anxiety disorder 09/04/2017   Oral contraceptive pill surveillance 09/04/2017   Eating disorder 05/16/2016   Insomnia 04/12/2016   Adjustment disorder with mixed anxiety and depressed mood 10/08/2015   ADHD (attention deficit hyperactivity disorder), combined type 05/20/2015   Maladaptive health behaviors affecting medical condition 10/17/2013   Hyperglycemia 07/11/2011   Hypoglycemia associated with diabetes (HCC)    Uncontrolled type 1 diabetes mellitus with hyperglycemia (HCC) 12/13/2010    Current Outpatient Medications on File Prior to  Visit  Medication Sig Dispense Refill   ACCU-CHEK FASTCLIX LANCETS MISC Check sugar 10 x daily 300 each 3   buPROPion (WELLBUTRIN XL) 300 MG 24 hr tablet TAKE 1 TABLET BY MOUTH EVERY DAY 90 tablet 1   Continuous Blood Gluc Sensor (DEXCOM G6 SENSOR) MISC 1 APPLICATION BY DOES NOT APPLY ROUTE AS NEEDED. 3 each 5   Continuous Blood Gluc Transmit (DEXCOM G6 TRANSMITTER) MISC 1 application by Does not apply route continuous as needed. 1 each 3   escitalopram (LEXAPRO) 10 MG tablet TAKE 1 AND 1/2 TABLETS DAILY BY MOUTH 135 tablet 1   fluticasone (FLONASE) 50 MCG/ACT nasal spray SPRAY 1 SPRAY INTO EACH NOSTRIL EVERY DAY     Glucagon (BAQSIMI ONE PACK) 3 MG/DOSE POWD Place 1 Dose into the nose as needed. (Patient not taking: No sig reported) 2 each 1   glucose blood test strip Use as instructed 300 each 3   hydrOXYzine (ATARAX/VISTARIL) 10 MG tablet Take 1 tablet (10 mg total) by mouth 3 (three) times daily as needed. 30 tablet 3   ibuprofen (ADVIL,MOTRIN) 200 MG tablet Take 400 mg by mouth every 6 (six) hours as needed for headache, mild pain or cramping.     insulin aspart (NOVOLOG FLEXPEN) 100 UNIT/ML FlexPen INJECT UP TO 50 UNITS SUBCUTANEOUSLY DAILY 15 mL 5   insulin degludec (TRESIBA FLEXTOUCH) 100 UNIT/ML FlexTouch Pen Inject up to 50 units per day SubQ 15 mL 6   Insulin Pen Needle (BD PEN NEEDLE NANO U/F) 32G X 4 MM MISC Use with insulin pens 6x daily 200 each 6   mupirocin ointment (BACTROBAN) 2 % Apply twice daily to underarm until bumps are gone 22 g 0   norgestimate-ethinyl estradiol (SPRINTEC 28) 0.25-35 MG-MCG tablet TAKE 1 TABLET DAILY. DISCARD PLACEBOS AND TAKE ACTIVE PILLS FOR CONTINUOUS CYCLING 112 tablet 4   prazosin (MINIPRESS) 2 MG capsule Take 1 capsule (2 mg total) by mouth at bedtime. 30 capsule 3   No current facility-administered medications on file prior to visit.    Allergies  Allergen Reactions   Penicillins Hives    Physical Exam:    Vitals:   06/17/21 0933  BP:  100/64  Pulse: (!) 104  Weight: 108 lb 6.4 oz (49.2 kg)  Height: 4' 11.45" (1.51 m)    Growth percentile SmartLinks can only be used for patients less than 39 years old.  Physical Exam Constitutional:      Appearance: Normal appearance.  HENT:     Head: Normocephalic.     Nose: Nose normal. No congestion.  Eyes:     Pupils: Pupils are equal, round, and reactive to light.  Cardiovascular:     Rate and Rhythm: Normal rate and regular rhythm.  Pulmonary:     Effort: Pulmonary effort is normal.  Breath sounds: Normal breath sounds.  Abdominal:     General: Abdomen is flat.     Palpations: Abdomen is soft.  Neurological:     Mental Status: She is alert.    Assessment/Plan:  1. Current moderate episode of major depressive disorder without prior episode (HCC) Still with significant anxiety, grief and nightmares. Did not establish with therapist. Will plan on increasing prazosin and doing weekly virtual visits until more stable living environment.     2. Uncontrolled type 1 diabetes mellitus with hyperglycemia (HCC) Higher sugars recently but following closely with endocrinology.    3. Generalized anxiety disorder Continue lexapro and wellbutrin. Continue nightly prazosin and hydroxyzine PRN.

## 2021-06-18 NOTE — Progress Notes (Signed)
I have reviewed the resident's note and plan of care and helped develop the plan as necessary.  Encouraged patient to continue working on establishing with therapist. Having issues with getting insurance card. We will continue to support her through her housing transition. Also following closely with endo.   Return in 1 week via video with me.   Alfonso Ramus, FNP

## 2021-06-21 ENCOUNTER — Other Ambulatory Visit: Payer: Self-pay | Admitting: Pediatrics

## 2021-06-21 ENCOUNTER — Telehealth (INDEPENDENT_AMBULATORY_CARE_PROVIDER_SITE_OTHER): Payer: BC Managed Care – PPO | Admitting: Pediatrics

## 2021-06-21 DIAGNOSIS — E1065 Type 1 diabetes mellitus with hyperglycemia: Secondary | ICD-10-CM | POA: Diagnosis not present

## 2021-06-21 DIAGNOSIS — F321 Major depressive disorder, single episode, moderate: Secondary | ICD-10-CM

## 2021-06-21 DIAGNOSIS — G4700 Insomnia, unspecified: Secondary | ICD-10-CM

## 2021-06-21 DIAGNOSIS — F4321 Adjustment disorder with depressed mood: Secondary | ICD-10-CM

## 2021-06-21 DIAGNOSIS — F411 Generalized anxiety disorder: Secondary | ICD-10-CM

## 2021-06-21 MED ORDER — DOXYCYCLINE HYCLATE 100 MG PO CAPS: 100.0000 mg | ORAL_CAPSULE | Freq: Two times a day (BID) | ORAL | 0 refills | Status: DC

## 2021-06-21 NOTE — Patient Instructions (Signed)
Take 800 mg ibuprofen and 2 sudafed every 6 hours for the next 24 hours

## 2021-06-21 NOTE — Progress Notes (Signed)
THIS RECORD MAY CONTAIN CONFIDENTIAL INFORMATION THAT SHOULD NOT BE RELEASED WITHOUT REVIEW OF THE SERVICE PROVIDER.  Virtual Follow-Up Visit via Video Note  I connected with Kelli Davis 's patient  on 06/21/21 at  1:30 PM EST by a video enabled telemedicine application and verified that I am speaking with the correct person using two identifiers.   Patient/parent location: Home   I discussed the limitations of evaluation and management by telemedicine and the availability of in person appointments.  I discussed that the purpose of this telehealth visit is to provide medical care while limiting exposure to the novel coronavirus.  The patient expressed understanding and agreed to proceed.   Kelli Davis is a 20 y.o. female referred by [name], FNP here today for follow-up of grief, t1dm, anxiety, depression, adhd, sleep disturbance.  Previsit planning completed:  yes   History was provided by the patient.  Supervising Physician: Dr. Delorse Lek  Plan from Last Visit:   Continue meds, work on counseling   Chief Complaint: Mood f/u  History of Present Illness:  Struggling with sinus pain and ear pain. She has been sick since last week. She has mucous also draining from her ears that is matted. Has a productive cough as well. Blood sugars won't come down below 250.   At the house she is moving into and had a "roommate" meeting about her moving in. Having some regrets about choosing this living choice because she has a difficult time with osme of the people there.  She got a new cat a week ago which has been good. Has been so sick her mood has been "numb". Still feeling very alone even though she has people around.   Still having weird dreams more often but most issues with sleep r/t ear pain.  Trying to eat at least one meal a day and some snacks but sometimes misses eating until late evening.    Allergies  Allergen Reactions   Penicillins Hives   Outpatient  Medications Prior to Visit  Medication Sig Dispense Refill   ACCU-CHEK FASTCLIX LANCETS MISC Check sugar 10 x daily 300 each 3   buPROPion (WELLBUTRIN XL) 300 MG 24 hr tablet TAKE 1 TABLET BY MOUTH EVERY DAY 90 tablet 1   Continuous Blood Gluc Sensor (DEXCOM G6 SENSOR) MISC 1 APPLICATION BY DOES NOT APPLY ROUTE AS NEEDED. 3 each 5   Continuous Blood Gluc Transmit (DEXCOM G6 TRANSMITTER) MISC 1 application by Does not apply route continuous as needed. 1 each 3   escitalopram (LEXAPRO) 10 MG tablet TAKE 1 AND 1/2 TABLETS DAILY BY MOUTH 135 tablet 1   fluticasone (FLONASE) 50 MCG/ACT nasal spray SPRAY 1 SPRAY INTO EACH NOSTRIL EVERY DAY     Glucagon (BAQSIMI ONE PACK) 3 MG/DOSE POWD Place 1 Dose into the nose as needed. (Patient not taking: No sig reported) 2 each 1   glucose blood test strip Use as instructed 300 each 3   hydrOXYzine (ATARAX/VISTARIL) 10 MG tablet Take 1 tablet (10 mg total) by mouth 3 (three) times daily as needed. 30 tablet 3   ibuprofen (ADVIL,MOTRIN) 200 MG tablet Take 400 mg by mouth every 6 (six) hours as needed for headache, mild pain or cramping.     insulin aspart (NOVOLOG FLEXPEN) 100 UNIT/ML FlexPen INJECT UP TO 50 UNITS SUBCUTANEOUSLY DAILY 15 mL 5   insulin degludec (TRESIBA FLEXTOUCH) 100 UNIT/ML FlexTouch Pen Inject up to 50 units per day SubQ 15 mL 6   Insulin Pen  Needle (BD PEN NEEDLE NANO U/F) 32G X 4 MM MISC Use with insulin pens 6x daily 200 each 6   mupirocin ointment (BACTROBAN) 2 % Apply twice daily to underarm until bumps are gone 22 g 0   norgestimate-ethinyl estradiol (SPRINTEC 28) 0.25-35 MG-MCG tablet TAKE 1 TABLET DAILY. DISCARD PLACEBOS AND TAKE ACTIVE PILLS FOR CONTINUOUS CYCLING 112 tablet 4   prazosin (MINIPRESS) 2 MG capsule Take 1 capsule (2 mg total) by mouth at bedtime. 30 capsule 3   No facility-administered medications prior to visit.     Patient Active Problem List   Diagnosis Date Noted   Current moderate episode of major depressive  disorder without prior episode (HCC) 05/25/2021   Vitamin D deficiency 05/05/2020   Insulin dose changed (HCC) 09/03/2018   Mixed hyperlipidemia 09/03/2018   Generalized anxiety disorder 09/04/2017   Oral contraceptive pill surveillance 09/04/2017   Eating disorder 05/16/2016   Insomnia 04/12/2016   Adjustment disorder with mixed anxiety and depressed mood 10/08/2015   ADHD (attention deficit hyperactivity disorder), combined type 05/20/2015   Maladaptive health behaviors affecting medical condition 10/17/2013   Hyperglycemia 07/11/2011   Hypoglycemia associated with diabetes (HCC)    Uncontrolled type 1 diabetes mellitus with hyperglycemia (HCC) 12/13/2010    The following portions of the patient's history were reviewed and updated as appropriate: allergies, current medications, past family history, past medical history, past social history, past surgical history, and problem list.  Visual Observations/Objective:   General Appearance: Well nourished well developed, in no apparent distress.  Eyes: conjunctiva no swelling or erythema ENT/Mouth: No hoarseness, No cough for duration of visit. Significant congestion noted.  Neck: Supple  Respiratory: Respiratory effort normal, normal rate, no retractions or distress.   Cardio: Appears well-perfused, noncyanotic Musculoskeletal: no obvious deformity Skin: visible skin without rashes, ecchymosis, erythema Neuro: Awake and oriented X 3,  Psych:  normal affect, Insight and Judgment appropriate.    Assessment/Plan: 1. Current moderate episode of major depressive disorder without prior episode (HCC) Continue same meds for now. Discussed would really benefit from doing work in counseling. She acknowledges this but says she has also been avoiding scheduling because it is hard to deal with everything.   2. Generalized anxiety disorder As above. Could consider buspar, pt elects to see how this week goes.   3. Uncontrolled type 1 diabetes  mellitus with hyperglycemia (HCC) Will tx for sinusitis given duration of symptoms and risk of DKA with bacterial infection.   4. Insomnia, unspecified type Continue prazosin  5. Grieving As above.    I discussed the assessment and treatment plan with the patient and/or parent/guardian.  They were provided an opportunity to ask questions and all were answered.  They agreed with the plan and demonstrated an understanding of the instructions. They were advised to call back or seek an in-person evaluation in the emergency room if the symptoms worsen or if the condition fails to improve as anticipated.   Follow-up:   1 week via video  Medical decision-making:   I spent 25 minutes on this telehealth visit inclusive of face-to-face video and care coordination time I was located in clinic during this encounter.   Alfonso Ramus, FNP    CC: [name], FNP, [name], FNP

## 2021-06-28 ENCOUNTER — Telehealth (INDEPENDENT_AMBULATORY_CARE_PROVIDER_SITE_OTHER): Payer: BC Managed Care – PPO | Admitting: Pediatrics

## 2021-06-28 DIAGNOSIS — F411 Generalized anxiety disorder: Secondary | ICD-10-CM | POA: Diagnosis not present

## 2021-06-28 DIAGNOSIS — G4700 Insomnia, unspecified: Secondary | ICD-10-CM

## 2021-06-28 DIAGNOSIS — F321 Major depressive disorder, single episode, moderate: Secondary | ICD-10-CM | POA: Diagnosis not present
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
## 2021-07-04 ENCOUNTER — Other Ambulatory Visit: Payer: Self-pay | Admitting: Pediatrics

## 2021-07-04 DIAGNOSIS — F411 Generalized anxiety disorder: Secondary | ICD-10-CM

## 2021-07-15 ENCOUNTER — Encounter: Payer: Self-pay | Admitting: Pediatrics

## 2021-07-15 ENCOUNTER — Other Ambulatory Visit: Payer: Self-pay

## 2021-07-15 ENCOUNTER — Ambulatory Visit: Payer: BC Managed Care – PPO | Admitting: Pediatrics

## 2021-07-15 VITALS — BP 116/78 | HR 78 | Ht 60.0 in | Wt 110.0 lb

## 2021-07-15 DIAGNOSIS — E1065 Type 1 diabetes mellitus with hyperglycemia: Secondary | ICD-10-CM

## 2021-07-15 DIAGNOSIS — F902 Attention-deficit hyperactivity disorder, combined type: Secondary | ICD-10-CM | POA: Diagnosis not present

## 2021-07-15 DIAGNOSIS — F321 Major depressive disorder, single episode, moderate: Secondary | ICD-10-CM | POA: Diagnosis not present

## 2021-07-15 DIAGNOSIS — F411 Generalized anxiety disorder: Secondary | ICD-10-CM

## 2021-07-15 DIAGNOSIS — G4700 Insomnia, unspecified: Secondary | ICD-10-CM | POA: Diagnosis not present

## 2021-07-15 MED ORDER — ESCITALOPRAM OXALATE 20 MG PO TABS: 20.0000 mg | ORAL_TABLET | Freq: Every day | ORAL | 2 refills | Status: DC

## 2021-07-15 NOTE — Progress Notes (Signed)
History was provided by the patient.  Kelli Davis is a 20 y.o. female who is here for anxiety, depression, ADHD, grief.  [name], FNP   HPI:  Pt reports she went to [place] with family for Thanksgiving. Had some issues with family and their not being super nice to her. She is moving in with friends who are engaged and renting a room from them. Moves in full time today.   Did not reach out to therapist yet. She lost the information for who it was.   Diabetes has been going ok. She had one day where she didn't want to eat or take insulin but felt really sick the next day. She says she learned her lesson from this.   Trying to get herself to eat more even if it's something like protein bars. People were commenting on her plate at the holidays.   She finally got her state ID!   Sleeping more during the day, still with some difficulties sleeping at night. Gets anxious at bedtime and can't fall asleep.   PHQ-SADS Last 3 Score only 07/19/2021 05/05/2020 06/12/2019  PHQ-15 Score 10 7 7   Total GAD-7 Score 19 6 18   PHQ Adolescent Score 20 5 17      No LMP recorded. (Menstrual status: Oral contraceptives).   Patient Active Problem List   Diagnosis Date Noted   Current moderate episode of major depressive disorder without prior episode (HCC) 05/25/2021   Vitamin D deficiency 05/05/2020   Insulin dose changed (HCC) 09/03/2018   Mixed hyperlipidemia 09/03/2018   Generalized anxiety disorder 09/04/2017   Oral contraceptive pill surveillance 09/04/2017   Eating disorder 05/16/2016   Insomnia 04/12/2016   Adjustment disorder with mixed anxiety and depressed mood 10/08/2015   ADHD (attention deficit hyperactivity disorder), combined type 05/20/2015   Maladaptive health behaviors affecting medical condition 10/17/2013   Hyperglycemia 07/11/2011   Hypoglycemia associated with diabetes (HCC)    Uncontrolled type 1 diabetes mellitus with hyperglycemia (HCC) 12/13/2010    Current  Outpatient Medications on File Prior to Visit  Medication Sig Dispense Refill   ACCU-CHEK FASTCLIX LANCETS MISC Check sugar 10 x daily 300 each 3   buPROPion (WELLBUTRIN XL) 300 MG 24 hr tablet TAKE 1 TABLET BY MOUTH EVERY DAY 90 tablet 1   Continuous Blood Gluc Sensor (DEXCOM G6 SENSOR) MISC 1 APPLICATION BY DOES NOT APPLY ROUTE AS NEEDED. 3 each 5   Continuous Blood Gluc Transmit (DEXCOM G6 TRANSMITTER) MISC 1 application by Does not apply route continuous as needed. 1 each 3   doxycycline (VIBRAMYCIN) 100 MG capsule Take 1 capsule (100 mg total) by mouth 2 (two) times daily. 14 capsule 0   escitalopram (LEXAPRO) 10 MG tablet TAKE 1 AND 1/2 TABLETS DAILY BY MOUTH 135 tablet 1   fluticasone (FLONASE) 50 MCG/ACT nasal spray SPRAY 1 SPRAY INTO EACH NOSTRIL EVERY DAY     Glucagon (BAQSIMI ONE PACK) 3 MG/DOSE POWD Place 1 Dose into the nose as needed. 2 each 1   glucose blood test strip Use as instructed 300 each 3   hydrOXYzine (ATARAX/VISTARIL) 10 MG tablet Take 1 tablet (10 mg total) by mouth 3 (three) times daily as needed. 30 tablet 3   ibuprofen (ADVIL,MOTRIN) 200 MG tablet Take 400 mg by mouth every 6 (six) hours as needed for headache, mild pain or cramping.     insulin aspart (NOVOLOG FLEXPEN) 100 UNIT/ML FlexPen INJECT UP TO 50 UNITS SUBCUTANEOUSLY DAILY 15 mL 5   insulin degludec (TRESIBA  FLEXTOUCH) 100 UNIT/ML FlexTouch Pen Inject up to 50 units per day SubQ 15 mL 6   Insulin Pen Needle (BD PEN NEEDLE NANO U/F) 32G X 4 MM MISC Use with insulin pens 6x daily 200 each 6   mupirocin ointment (BACTROBAN) 2 % Apply twice daily to underarm until bumps are gone 22 g 0   norgestimate-ethinyl estradiol (SPRINTEC 28) 0.25-35 MG-MCG tablet TAKE 1 TABLET DAILY. DISCARD PLACEBOS AND TAKE ACTIVE PILLS FOR CONTINUOUS CYCLING 112 tablet 4   prazosin (MINIPRESS) 2 MG capsule Take 1 capsule (2 mg total) by mouth at bedtime. 30 capsule 3   No current facility-administered medications on file prior to  visit.    Allergies  Allergen Reactions   Penicillins Hives    Physical Exam:    Vitals:   07/15/21 0849  BP: 116/78  Pulse: 78  Weight: 110 lb (49.9 kg)  Height: 5' (1.524 m)    Growth percentile SmartLinks can only be used for patients less than 48 years old.  Physical Exam Vitals and nursing note reviewed.  Constitutional:      General: She is not in acute distress.    Appearance: She is well-developed.  Neck:     Thyroid: No thyromegaly.  Cardiovascular:     Rate and Rhythm: Normal rate and regular rhythm.     Heart sounds: No murmur heard. Pulmonary:     Breath sounds: Normal breath sounds.  Abdominal:     Palpations: Abdomen is soft. There is no mass.     Tenderness: There is no abdominal tenderness. There is no guarding.  Musculoskeletal:     Right lower leg: No edema.     Left lower leg: No edema.  Lymphadenopathy:     Cervical: No cervical adenopathy.  Skin:    General: Skin is warm.     Capillary Refill: Capillary refill takes less than 2 seconds.     Findings: No rash.  Neurological:     Mental Status: She is alert.     Comments: No tremor  Psychiatric:        Mood and Affect: Mood normal.        Behavior: Behavior normal.    Assessment/Plan: 1. Current moderate episode of major depressive disorder without prior episode (HCC) Overall doing ok. We will increase lexapro to 20 mg daily and monitor. No SI/HI.   2. Generalized anxiety disorder As above. Working to continue to encourage her to get set up with therapist.   3. ADHD (attention deficit hyperactivity disorder), combined type Continue wellbutrin.   4. Insomnia, unspecified type Continue hydroxyzine and prazosin.   5. Uncontrolled type 1 diabetes mellitus with hyperglycemia (HCC) Encouraged her to consider reaching out to endo and making appt with diabetes educator to make further insulin adjustments.   Return in 4 weeks or sooner as needed   Alfonso Ramus, FNP

## 2021-07-15 NOTE — Patient Instructions (Addendum)
[facility]  [facility] [address]   [place] [address]   Phone: [phone]  Increase tresiba by 2 units  Increase escitalopram to 20 mg  Take hydroxyzine 2-3 tablets at bedtime

## 2021-07-20 ENCOUNTER — Other Ambulatory Visit: Payer: Self-pay | Admitting: Pediatrics

## 2021-07-20 MED ORDER — ONDANSETRON HCL 8 MG PO TABS: 8.0000 mg | ORAL_TABLET | Freq: Three times a day (TID) | ORAL | 0 refills | Status: DC | PRN

## 2021-07-21 ENCOUNTER — Encounter (INDEPENDENT_AMBULATORY_CARE_PROVIDER_SITE_OTHER): Payer: Self-pay

## 2021-07-21 ENCOUNTER — Observation Stay (HOSPITAL_COMMUNITY)
Admission: EM | Admit: 2021-07-21 | Discharge: 2021-07-22 | Disposition: A | Discharge status: Home / Self Care | Payer: BC Managed Care – PPO | Attending: Internal Medicine | Admitting: Internal Medicine

## 2021-07-21 ENCOUNTER — Encounter (HOSPITAL_COMMUNITY): Payer: Self-pay | Admitting: Emergency Medicine

## 2021-07-21 DIAGNOSIS — Z20822 Contact with and (suspected) exposure to covid-19: Secondary | ICD-10-CM | POA: Insufficient documentation

## 2021-07-21 DIAGNOSIS — Z7722 Contact with and (suspected) exposure to environmental tobacco smoke (acute) (chronic): Secondary | ICD-10-CM | POA: Insufficient documentation

## 2021-07-21 DIAGNOSIS — N179 Acute kidney failure, unspecified: Secondary | ICD-10-CM

## 2021-07-21 DIAGNOSIS — E1010 Type 1 diabetes mellitus with ketoacidosis without coma: Secondary | ICD-10-CM | POA: Diagnosis not present

## 2021-07-21 DIAGNOSIS — F902 Attention-deficit hyperactivity disorder, combined type: Secondary | ICD-10-CM | POA: Diagnosis not present

## 2021-07-21 DIAGNOSIS — F4323 Adjustment disorder with mixed anxiety and depressed mood: Secondary | ICD-10-CM | POA: Diagnosis not present

## 2021-07-21 DIAGNOSIS — Z794 Long term (current) use of insulin: Secondary | ICD-10-CM | POA: Insufficient documentation

## 2021-07-21 DIAGNOSIS — F509 Eating disorder, unspecified: Secondary | ICD-10-CM | POA: Diagnosis present

## 2021-07-21 DIAGNOSIS — E1110 Type 2 diabetes mellitus with ketoacidosis without coma: Secondary | ICD-10-CM | POA: Diagnosis present

## 2021-07-21 DIAGNOSIS — R9431 Abnormal electrocardiogram [ECG] [EKG]: Secondary | ICD-10-CM | POA: Diagnosis not present

## 2021-07-21 DIAGNOSIS — R112 Nausea with vomiting, unspecified: Secondary | ICD-10-CM

## 2021-07-21 HISTORY — DX: Eating disorder, unspecified: F50.9

## 2021-07-21 LAB — CBC WITH DIFFERENTIAL/PLATELET
Abs Immature Granulocytes: 0.02 10*3/uL (ref 0.00–0.07)
Basophils Absolute: 0 10*3/uL (ref 0.0–0.1)
Basophils Relative: 0 %
Eosinophils Absolute: 0 10*3/uL (ref 0.0–0.5)
Eosinophils Relative: 0 %
HCT: 47.3 % — ABNORMAL HIGH (ref 36.0–46.0)
Hemoglobin: 16.1 g/dL — ABNORMAL HIGH (ref 12.0–15.0)
Immature Granulocytes: 0 %
Lymphocytes Relative: 8 %
Lymphs Abs: 0.5 10*3/uL — ABNORMAL LOW (ref 0.7–4.0)
MCH: 31.5 pg (ref 26.0–34.0)
MCHC: 34 g/dL (ref 30.0–36.0)
MCV: 92.6 fL (ref 80.0–100.0)
Monocytes Absolute: 0.5 10*3/uL (ref 0.1–1.0)
Monocytes Relative: 9 %
Neutro Abs: 4.6 10*3/uL (ref 1.7–7.7)
Neutrophils Relative %: 83 %
Platelets: 223 10*3/uL (ref 150–400)
RBC: 5.11 MIL/uL (ref 3.87–5.11)
RDW: 12.5 % (ref 11.5–15.5)
WBC: 5.6 10*3/uL (ref 4.0–10.5)
nRBC: 0.4 % — ABNORMAL HIGH (ref 0.0–0.2)

## 2021-07-21 LAB — I-STAT BETA HCG BLOOD, ED (MC, WL, AP ONLY): I-stat hCG, quantitative: <5 m[IU]/mL (ref <5)

## 2021-07-21 LAB — CBG MONITORING, ED
Glucose-Capillary: 148 mg/dL — ABNORMAL HIGH (ref 70–99)
Glucose-Capillary: 163 mg/dL — ABNORMAL HIGH (ref 70–99)
Glucose-Capillary: 180 mg/dL — ABNORMAL HIGH (ref 70–99)
Glucose-Capillary: 191 mg/dL — ABNORMAL HIGH (ref 70–99)
Glucose-Capillary: 216 mg/dL — ABNORMAL HIGH (ref 70–99)
Glucose-Capillary: 217 mg/dL — ABNORMAL HIGH (ref 70–99)
Glucose-Capillary: 338 mg/dL — ABNORMAL HIGH (ref 70–99)

## 2021-07-21 LAB — I-STAT VENOUS BLOOD GAS, ED
Acid-base deficit: 13 mmol/L — ABNORMAL HIGH (ref 0.0–2.0)
Bicarbonate: 11.3 mmol/L — ABNORMAL LOW (ref 20.0–28.0)
Calcium, Ion: 1.18 mmol/L (ref 1.15–1.40)
HCT: 49 % — ABNORMAL HIGH (ref 36.0–46.0)
Hemoglobin: 16.7 g/dL — ABNORMAL HIGH (ref 12.0–15.0)
O2 Saturation: 97 %
Potassium: 4.1 mmol/L (ref 3.5–5.1)
Sodium: 132 mmol/L — ABNORMAL LOW (ref 135–145)
TCO2: 12 mmol/L — ABNORMAL LOW (ref 22–32)
pCO2, Ven: 23.2 mmHg — ABNORMAL LOW (ref 44.0–60.0)
pH, Ven: 7.296 (ref 7.250–7.430)
pO2, Ven: 103 mmHg — ABNORMAL HIGH (ref 32.0–45.0)

## 2021-07-21 LAB — BASIC METABOLIC PANEL
Anion gap: 20 — ABNORMAL HIGH (ref 5–15)
BUN: 12 mg/dL (ref 6–20)
CO2: 9 mmol/L — ABNORMAL LOW (ref 22–32)
Calcium: 9.1 mg/dL (ref 8.9–10.3)
Chloride: 101 mmol/L (ref 98–111)
Creatinine, Ser: 1.28 mg/dL — ABNORMAL HIGH (ref 0.44–1.00)
GFR, Estimated: >60 mL/min (ref >60)
Glucose, Bld: 217 mg/dL — ABNORMAL HIGH (ref 70–99)
Potassium: 3.9 mmol/L (ref 3.5–5.1)
Sodium: 130 mmol/L — ABNORMAL LOW (ref 135–145)

## 2021-07-21 LAB — RESP PANEL BY RT-PCR (FLU A&B, COVID) ARPGX2
Influenza A by PCR: POSITIVE — AB
Influenza B by PCR: NEGATIVE
SARS Coronavirus 2 by RT PCR: NEGATIVE

## 2021-07-21 LAB — BETA-HYDROXYBUTYRIC ACID: Beta-Hydroxybutyric Acid: 5.35 mmol/L — ABNORMAL HIGH (ref 0.05–0.27)

## 2021-07-21 MED ORDER — LACTATED RINGERS IV SOLN: INTRAVENOUS | Status: DC

## 2021-07-21 MED ORDER — SODIUM CHLORIDE 0.9% FLUSH
10.0000 mL | Freq: Two times a day (BID) | INTRAVENOUS | Status: DC
  Administered 2021-07-21: 40 mL

## 2021-07-21 MED ORDER — INSULIN REGULAR(HUMAN) IN NACL 100-0.9 UT/100ML-% IV SOLN
INTRAVENOUS | Status: DC
  Administered 2021-07-21: 1.6 [IU]/h via INTRAVENOUS
  Administered 2021-07-21: 6 [IU]/h via INTRAVENOUS
  Administered 2021-07-22: 2 [IU]/h via INTRAVENOUS
  Administered 2021-07-22: 3 [IU]/h via INTRAVENOUS
  Administered 2021-07-22: 0.9 [IU]/h via INTRAVENOUS
  Administered 2021-07-22: 3.2 [IU]/h via INTRAVENOUS
  Filled 2021-07-21: qty 100

## 2021-07-21 MED ORDER — ACETAMINOPHEN 325 MG PO TABS: 650.0000 mg | ORAL_TABLET | Freq: Four times a day (QID) | ORAL | Status: DC | PRN

## 2021-07-21 MED ORDER — DIPHENHYDRAMINE HCL 50 MG/ML IJ SOLN
25.0000 mg | Freq: Once | INTRAMUSCULAR | Status: AC
  Administered 2021-07-21: 25 mg via INTRAVENOUS
  Filled 2021-07-21: qty 1

## 2021-07-21 MED ORDER — SODIUM CHLORIDE 0.9% FLUSH: 10.0000 mL | INTRAVENOUS | Status: DC | PRN

## 2021-07-21 MED ORDER — LACTATED RINGERS IV BOLUS: 1000.0000 mL | Freq: Once | INTRAVENOUS | Status: DC

## 2021-07-21 MED ORDER — PRAZOSIN HCL 2 MG PO CAPS
2.0000 mg | ORAL_CAPSULE | Freq: Every day | ORAL | Status: DC
  Administered 2021-07-21: 2 mg via ORAL
  Filled 2021-07-21 (×2): qty 1

## 2021-07-21 MED ORDER — METOCLOPRAMIDE HCL 5 MG/ML IJ SOLN
5.0000 mg | Freq: Once | INTRAMUSCULAR | Status: AC
  Administered 2021-07-21: 5 mg via INTRAVENOUS
  Filled 2021-07-21: qty 2

## 2021-07-21 MED ORDER — ESCITALOPRAM OXALATE 10 MG PO TABS
20.0000 mg | ORAL_TABLET | Freq: Every day | ORAL | Status: DC
  Administered 2021-07-22: 20 mg via ORAL
  Filled 2021-07-21: qty 2

## 2021-07-21 MED ORDER — BUPROPION HCL ER (XL) 300 MG PO TB24
300.0000 mg | ORAL_TABLET | Freq: Every day | ORAL | Status: DC
  Administered 2021-07-22: 300 mg via ORAL
  Filled 2021-07-21 (×2): qty 1

## 2021-07-21 MED ORDER — DEXTROSE 50 % IV SOLN: 0.0000 mL | INTRAVENOUS | Status: DC | PRN

## 2021-07-21 MED ORDER — ENOXAPARIN SODIUM 40 MG/0.4ML IJ SOSY
40.0000 mg | PREFILLED_SYRINGE | INTRAMUSCULAR | Status: DC
  Filled 2021-07-21: qty 0.4

## 2021-07-21 MED ORDER — DEXTROSE IN LACTATED RINGERS 5 % IV SOLN: INTRAVENOUS | Status: DC

## 2021-07-21 MED ORDER — SODIUM CHLORIDE 0.9 % IV BOLUS: 1000.0000 mL | Freq: Once | INTRAVENOUS | Status: DC

## 2021-07-21 MED ORDER — ONDANSETRON HCL 4 MG/2ML IJ SOLN
4.0000 mg | Freq: Four times a day (QID) | INTRAMUSCULAR | Status: DC | PRN
  Administered 2021-07-22: 4 mg via INTRAVENOUS
  Filled 2021-07-21: qty 2

## 2021-07-21 MED ORDER — LACTATED RINGERS IV BOLUS
1000.0000 mL | Freq: Once | INTRAVENOUS | Status: AC
  Administered 2021-07-21: 1000 mL via INTRAVENOUS

## 2021-07-21 MED ORDER — NORGESTIMATE-ETH ESTRADIOL 0.25-35 MG-MCG PO TABS: 1.0000 | ORAL_TABLET | Freq: Every day | ORAL | Status: DC

## 2021-07-21 MED ORDER — LACTATED RINGERS IV BOLUS
2000.0000 mL | Freq: Once | INTRAVENOUS | Status: AC
  Administered 2021-07-21: 2000 mL via INTRAVENOUS

## 2021-07-21 MED ORDER — CHLORHEXIDINE GLUCONATE CLOTH 2 % EX PADS: 6.0000 | MEDICATED_PAD | Freq: Every day | CUTANEOUS | Status: DC

## 2021-07-21 MED ORDER — POTASSIUM CHLORIDE 10 MEQ/100ML IV SOLN: 10.0000 meq | INTRAVENOUS | Status: AC

## 2021-07-21 MED ORDER — HYDROXYZINE HCL 10 MG PO TABS
10.0000 mg | ORAL_TABLET | Freq: Three times a day (TID) | ORAL | Status: DC | PRN
  Filled 2021-07-21: qty 1

## 2021-07-21 NOTE — ED Triage Notes (Signed)
Patient here with complaint of emesis since Tuesday, history of type 1 diabetes. Mother at bedside states high ketone count at home. Patient alert, oriented, and ill-appearing at this time.

## 2021-07-21 NOTE — ED Notes (Signed)
Mother would like a phone call when pt gets a bed

## 2021-07-21 NOTE — ED Provider Notes (Signed)
[facility] EMERGENCY DEPARTMENT Provider Note   CSN: [id number] Arrival date & time: 07/21/21  1237     History Chief Complaint  Patient presents with   Emesis    Kelli Davis is a 20 y.o. female.  This is a 20 y.o. female with significant medical history as below, including IDDM who presents to the ED with complaint of nausea, vomiting, concern for DKA.  Patient was recently exposed to multiple sick contacts approximately 5 days ago they were subsequently diagnosed with influenza.  For the past couple days patient has been having ongoing nausea and vomiting, significant reduced oral intake.  Difficulty controlling blood sugar.  No diarrhea. Generalized abd pain. No cp or dib. Started taking Zofran but has been unable to tolerate any oral intake over the past 24 hours.  Blood sugar at home this morning around 500, improved to 300 range after taking insulin.  Mother has been testing urine and has been heavy for ketones.  Patient multiple hospitalizations secondary to her insulin-dependent diabetes mellitus.  Follows with endocrinology as an outpatient  The history is provided by the patient. No language interpreter was used.  Emesis Associated symptoms: abdominal pain, arthralgias and headaches   Associated symptoms: no cough and no fever       Past Medical History:  Diagnosis Date   ADD (attention deficit disorder)    Febrile seizure (HCC)    History of eye surgery    Hypoglycemia associated with diabetes (HCC)    Hypoglycemia associated with diabetes (HCC)    Physical growth delay    Type 1 diabetes mellitus not at goal [facility])     Patient Active Problem List   Diagnosis Date Noted   Current moderate episode of major depressive disorder without prior episode (HCC) 05/25/2021   Vitamin D deficiency 05/05/2020   Insulin dose changed (HCC) 09/03/2018   Mixed hyperlipidemia 09/03/2018   Generalized anxiety disorder 09/04/2017   Oral contraceptive pill  surveillance 09/04/2017   Eating disorder 05/16/2016   Insomnia 04/12/2016   Adjustment disorder with mixed anxiety and depressed mood 10/08/2015   ADHD (attention deficit hyperactivity disorder), combined type 05/20/2015   Maladaptive health behaviors affecting medical condition 10/17/2013   Hyperglycemia 07/11/2011   Hypoglycemia associated with diabetes (HCC)    Uncontrolled type 1 diabetes mellitus with hyperglycemia (HCC) 12/13/2010    Past Surgical History:  Procedure Laterality Date   EYE MUSCLE SURGERY       OB History   No obstetric history on file.     Family History  Problem Relation Age of Onset   Cancer Maternal Grandmother    Hypertension Maternal Grandfather    Depression Maternal Grandfather    Diabetes Paternal Grandfather    Anxiety disorder Father    Depression Mother    Anxiety disorder Sister     Social History   Tobacco Use   Smoking status: Never    Passive exposure: Yes   Smokeless tobacco: Never   Tobacco comments:    Pt reports only once  Substance Use Topics   Alcohol use: Yes    Comment: Pt reports only once or twice   Drug use: No    Home Medications Prior to Admission medications   Medication Sig Start Date End Date Taking? Authorizing Provider  ACCU-CHEK FASTCLIX LANCETS MISC Check sugar 10 x daily 10/21/15   Gretchen Short, NP  buPROPion (WELLBUTRIN XL) 300 MG 24 hr tablet TAKE 1 TABLET BY MOUTH EVERY DAY 05/26/21   Maxwell Caul,  Jan Fireman, FNP  Continuous Blood Gluc Sensor (DEXCOM G6 SENSOR) MISC 1 APPLICATION BY DOES NOT APPLY ROUTE AS NEEDED. 01/05/21   Gretchen Short, NP  Continuous Blood Gluc Transmit (DEXCOM G6 TRANSMITTER) MISC 1 application by Does not apply route continuous as needed. 07/23/20   Gretchen Short, NP  doxycycline (VIBRAMYCIN) 100 MG capsule Take 1 capsule (100 mg total) by mouth 2 (two) times daily. 06/21/21   [name], FNP  escitalopram (LEXAPRO) 20 MG tablet Take 1 tablet (20 mg total) by mouth daily.  07/15/21 07/15/22  [name], FNP  fluticasone (FLONASE) 50 MCG/ACT nasal spray SPRAY 1 SPRAY INTO EACH NOSTRIL EVERY DAY 02/06/19   [provider]  Glucagon (BAQSIMI ONE PACK) 3 MG/DOSE POWD Place 1 Dose into the nose as needed. 10/07/19   Gretchen Short, NP  glucose blood test strip Use as instructed 05/13/21   Gretchen Short, NP  hydrOXYzine (ATARAX/VISTARIL) 10 MG tablet Take 1 tablet (10 mg total) by mouth 3 (three) times daily as needed. 05/11/21   [name], FNP  ibuprofen (ADVIL,MOTRIN) 200 MG tablet Take 400 mg by mouth every 6 (six) hours as needed for headache, mild pain or cramping.    [provider]  insulin aspart (NOVOLOG FLEXPEN) 100 UNIT/ML FlexPen INJECT UP TO 50 UNITS SUBCUTANEOUSLY DAILY 02/18/21   Gretchen Short, NP  insulin degludec (TRESIBA FLEXTOUCH) 100 UNIT/ML FlexTouch Pen Inject up to 50 units per day SubQ 04/29/21   Gretchen Short, NP  Insulin Pen Needle (BD PEN NEEDLE NANO U/F) 32G X 4 MM MISC Use with insulin pens 6x daily 10/22/20   Gretchen Short, NP  mupirocin ointment (BACTROBAN) 2 % Apply twice daily to underarm until bumps are gone 08/03/20   Alfonso Ramus T, FNP  norgestimate-ethinyl estradiol (SPRINTEC 28) 0.25-35 MG-MCG tablet TAKE 1 TABLET DAILY. DISCARD PLACEBOS AND TAKE ACTIVE PILLS FOR CONTINUOUS CYCLING 05/04/20   [name], FNP  ondansetron (ZOFRAN) 8 MG tablet Take 1 tablet (8 mg total) by mouth every 8 (eight) hours as needed for nausea or vomiting. 07/20/21   [name], FNP  prazosin (MINIPRESS) 2 MG capsule Take 1 capsule (2 mg total) by mouth at bedtime. 05/18/21   [name], FNP    Allergies    Penicillins  Review of Systems   Review of Systems  Constitutional:  Positive for appetite change and fatigue. Negative for activity change and fever.  HENT:  Negative for facial swelling and trouble swallowing.   Eyes:  Negative for discharge and redness.  Respiratory:  Negative for  cough and shortness of breath.   Cardiovascular:  Negative for chest pain and palpitations.  Gastrointestinal:  Positive for abdominal pain, nausea and vomiting.  Genitourinary:  Negative for dysuria and flank pain.  Musculoskeletal:  Positive for arthralgias. Negative for back pain and gait problem.  Skin:  Negative for pallor and rash.  Neurological:  Positive for headaches. Negative for syncope.   Physical Exam Updated Vital Signs BP 107/75   Pulse 93   Temp 98.9 F (37.2 C)   Resp (!) 21   SpO2 99%   Physical Exam Vitals and nursing note reviewed.  Constitutional:      General: She is not in acute distress.    Appearance: Normal appearance.  HENT:     Head: Normocephalic and atraumatic.     Right Ear: External ear normal.     Left Ear: External ear normal.     Nose: Nose  normal.     Mouth/Throat:     Mouth: Mucous membranes are dry.  Eyes:     General: No scleral icterus.       Right eye: No discharge.        Left eye: No discharge.  Cardiovascular:     Rate and Rhythm: Normal rate and regular rhythm.     Pulses: Normal pulses.     Heart sounds: Normal heart sounds.  Pulmonary:     Effort: Pulmonary effort is normal. No respiratory distress.     Breath sounds: Normal breath sounds.  Abdominal:     General: Abdomen is flat.     Tenderness: There is generalized abdominal tenderness.  Musculoskeletal:        General: Normal range of motion.     Cervical back: Normal range of motion.     Right lower leg: No edema.     Left lower leg: No edema.  Skin:    General: Skin is warm and dry.     Capillary Refill: Capillary refill takes less than 2 seconds.  Neurological:     Mental Status: She is alert.  Psychiatric:        Mood and Affect: Mood normal.        Behavior: Behavior normal.    ED Results / Procedures / Treatments   Labs (all labs ordered are listed, but only abnormal results are displayed) Labs Reviewed  BASIC METABOLIC PANEL - Abnormal; Notable for  the following components:      Result Value   Sodium 130 (*)    CO2 9 (*)    Glucose, Bld 217 (*)    Creatinine, Ser 1.28 (*)    Anion gap 20 (*)    All other components within normal limits  BETA-HYDROXYBUTYRIC ACID - Abnormal; Notable for the following components:   Beta-Hydroxybutyric Acid 5.35 (*)    All other components within normal limits  CBC WITH DIFFERENTIAL/PLATELET - Abnormal; Notable for the following components:   Hemoglobin 16.1 (*)    HCT 47.3 (*)    nRBC 0.4 (*)    Lymphs Abs 0.5 (*)    All other components within normal limits  CBG MONITORING, ED - Abnormal; Notable for the following components:   Glucose-Capillary 217 (*)    All other components within normal limits  CBG MONITORING, ED - Abnormal; Notable for the following components:   Glucose-Capillary 191 (*)    All other components within normal limits  I-STAT VENOUS BLOOD GAS, ED - Abnormal; Notable for the following components:   pCO2, Ven 23.2 (*)    pO2, Ven 103.0 (*)    Bicarbonate 11.3 (*)    TCO2 12 (*)    Acid-base deficit 13.0 (*)    Sodium 132 (*)    HCT 49.0 (*)    Hemoglobin 16.7 (*)    All other components within normal limits  RESP PANEL BY RT-PCR (FLU A&B, COVID) ARPGX2  BASIC METABOLIC PANEL  BASIC METABOLIC PANEL  BASIC METABOLIC PANEL  BETA-HYDROXYBUTYRIC ACID  URINALYSIS, ROUTINE W REFLEX MICROSCOPIC  I-STAT BETA HCG BLOOD, ED ([facility], [facility], [facility] ONLY)    EKG EKG Interpretation  Date/Time:  Wednesday July 21 2021 13:33:53 EST Ventricular Rate:  93 PR Interval:  142 QRS Duration: 76 QT Interval:  348 QTC Calculation: 432 R Axis:   83 Text Interpretation: Normal sinus rhythm Possible Left atrial enlargement Borderline ECG Benign early repolarization Confirmed by Tanda Rockers ([id number]) on 07/21/2021 2:57:35 PM  Radiology No  results found.  Procedures .Critical Care Performed by: Sloan Leiter, DO Authorized by: Sloan Leiter, DO   Critical care provider statement:     Critical care time (minutes):  48   Critical care time was exclusive of:  Separately billable procedures and treating other patients   Critical care was necessary to treat or prevent imminent or life-threatening deterioration of the following conditions:  Endocrine crisis   Critical care was time spent personally by me on the following activities:  Development of treatment plan with patient or surrogate, discussions with consultants, evaluation of patient's response to treatment, examination of patient, ordering and review of laboratory studies, ordering and review of radiographic studies, ordering and performing treatments and interventions, pulse oximetry, re-evaluation of patient's condition and review of old charts   Medications Ordered in ED Medications  lactated ringers bolus 1,000 mL (has no administration in time range)  metoCLOPramide (REGLAN) injection 5 mg (has no administration in time range)  diphenhydrAMINE (BENADRYL) injection 25 mg (has no administration in time range)    ED Course  I have reviewed the triage vital signs and the nursing notes.  Pertinent labs & imaging results that were available during my care of the patient were reviewed by me and considered in my medical decision making (see chart for details).    MDM Rules/Calculators/A&P                           CC: Nausea, vomiting, elevated glucose  This patient complains of above; this involves an extensive number of treatment options and is a complaint that carries with it a high risk of complications and morbidity. Vital signs were reviewed. Serious etiologies considered.  Record review:  Previous records obtained and reviewed   Additional history obtained from mother  Work up as above, notable for:  Lab results that were available during my care of the patient were reviewed by me and considered in my medical decision making.   Labs reviewed, patient with low bicarb, acidosis 7.29, anion gap of 20,  elevated glucose 217.  Urinalysis pending.  In addition patient does have increased to her baseline renal function, creatinine today is 1.28. Baseline around 0.7.  Concern for AKI  Management: Antiemetics, IV fluid bolus  Reassessment:  Pt feeling somewhat better but still unable to tolerate oral intake. Ivf are in progress.   Glucose levels are improving.   Workup today concerning for DKA, recommend admission, pt and mother at bedside are agreeable.          This chart was dictated using voice recognition software.  Despite best efforts to proofread,  errors can occur which can change the documentation meaning.  Final Clinical Impression(s) / ED Diagnoses Final diagnoses:  Nausea and vomiting, unspecified vomiting type  AKI (acute kidney injury) (HCC)  Diabetic ketoacidosis without coma associated with type 1 diabetes mellitus [facility])    Rx / DC Orders ED Discharge Orders     None        Sloan Leiter, DO 07/21/21 1628

## 2021-07-21 NOTE — ED Provider Notes (Signed)
Emergency Medicine Provider Triage Evaluation Note  Kelli Davis , a 20 y.o. female  was evaluated in triage.  Pt complains of nausea, vomiting, ketosis since yesterday.  Mom does report that patient went to a house party over the weekend and everyone is catching fully from same.  She did have a fever yesterday however then began having nausea and vomiting consistent with patient's previous DKA.  Mom has been giving her insulin and last received 15 units of insulin around 10 AM this morning.  Her CBG was as high as 500 at home however currently 217.   Review of Systems  Positive: + nausea, vomiting, fever Negative: - abd pain, SOB  Physical Exam  BP 124/85   Pulse 98   Temp 98.9 F (37.2 C) (Oral)   Resp 20   SpO2 97%  Gen:   Awake, no distress   Resp:  Normal effort  MSK:   Moves extremities without difficulty  Other:  Smells of ketones. Abd soft and nontender. Dry MM.   Medical Decision Making  Medically screening exam initiated at 1:31 PM.  Appropriate orders placed.  Dajanee Voorheis was informed that the remainder of the evaluation will be completed by another provider, this initial triage assessment does not replace that evaluation, and the importance of remaining in the ED until their evaluation is complete.  Concern for DKA. Triage nurse aware pt should go back to a room asap.    Tanda Rockers, PA-C 07/21/21 1333    Sloan Leiter, DO 07/21/21 1642

## 2021-07-21 NOTE — H&P (Signed)
History and Physical    Kelli Davis OFB:[id number] DOB: [birth year] DOA: 07/21/2021  PCP: [name], FNP Consultants:  Fransico Michael - endocrinology Patient coming from:  Home - lives alone, moved out Monday; NOK: Mother, [phone]  Chief Complaint: Emesis  HPI: Kelli Davis is a 20 y.o. female with medical history significant of ADD and type 1 DM presenting with emesis.  She moved out Monday, came home Tuesday and reported not feeling well.  She had a fever to 100.3.  Her mother checked and she had ketones and her mom started the diabetic protocol.  She was still uncontrolled this AM.  She usually improves with the protocol and has not this time.  She has not kept anything down except for sips of ginger ale.  She sucked on Pedialyte popsicles.  No further fever.  +confusion - she keeps asking her mother if she has called her boyfriend, but he died in a motorcycle accident 3 months ago.  She has not been hospitalized in a long time, maybe 5 years ago.  She has a remote h/o eating d/o and this worsened some after her boyfriend's death; she has bene doing better recently.   ED Course: DKA.  pH 7.25, mentating ok.  Giving boluses rather than infusion.  Review of Systems: As per HPI; otherwise review of systems reviewed and negative.   Ambulatory Status:  Ambulates without assistance  COVID Vaccine Status:  Complete  Past Medical History:  Diagnosis Date   ADD (attention deficit disorder)    Eating disorder    Febrile seizure (HCC)    History of eye surgery    Hypoglycemia associated with diabetes (HCC)    Physical growth delay    Type 1 diabetes mellitus not at goal [facility])     Past Surgical History:  Procedure Laterality Date   EYE MUSCLE SURGERY      Social History   Socioeconomic History   Marital status: Single    Spouse name: Not on file   Number of children: Not on file   Years of education: Not on file   Highest education level: Not on file  Occupational  History   Occupation: waitress/bartender  Tobacco Use   Smoking status: Never    Passive exposure: Yes   Smokeless tobacco: Never   Tobacco comments:    Pt reports only once  Substance and Sexual Activity   Alcohol use: Yes    Comment: Pt reports only once or twice   Drug use: No   Sexual activity: Yes    Birth control/protection: Condom    Comment: Parents are not aware pt. has been sexually active with BF.  Other Topics Concern   Not on file  Social History Narrative   The child's parents are divorced. Father has remarried. Stepmother has type 1 diabetes mellitus and is on an insulin pump.Spends time in both her mother and father's homes. Parents very cooperative about diabetes care.    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 Strain: Not on [employer] Insecurity: Not on file  Transportation Needs: Not on file  Physical Activity: Not on file  Stress: Not on file  Social Connections: Not on file  Intimate Partner Violence: Not on file    Allergies  Allergen Reactions   Penicillins Hives    Family History  Problem Relation Age of Onset   Cancer Maternal Grandmother    Hypertension Maternal Grandfather    Depression Maternal Grandfather  Diabetes Paternal Grandfather    Anxiety disorder Father    Depression Mother    Anxiety disorder Sister     Prior to Admission medications   Medication Sig Start Date End Date Taking? Authorizing Provider  ACCU-CHEK FASTCLIX LANCETS MISC Check sugar 10 x daily 10/21/15   Gretchen Short, NP  buPROPion (WELLBUTRIN XL) 300 MG 24 hr tablet TAKE 1 TABLET BY MOUTH EVERY DAY 05/26/21   [name], FNP  Continuous Blood Gluc Sensor (DEXCOM G6 SENSOR) MISC 1 APPLICATION BY DOES NOT APPLY ROUTE AS NEEDED. 01/05/21   Gretchen Short, NP  Continuous Blood Gluc Transmit (DEXCOM G6 TRANSMITTER) MISC 1 application by Does not apply route continuous as needed. 07/23/20   Gretchen Short, NP   doxycycline (VIBRAMYCIN) 100 MG capsule Take 1 capsule (100 mg total) by mouth 2 (two) times daily. 06/21/21   [name], FNP  escitalopram (LEXAPRO) 20 MG tablet Take 1 tablet (20 mg total) by mouth daily. 07/15/21 07/15/22  [name], FNP  fluticasone (FLONASE) 50 MCG/ACT nasal spray SPRAY 1 SPRAY INTO EACH NOSTRIL EVERY DAY 02/06/19   [provider]  Glucagon (BAQSIMI ONE PACK) 3 MG/DOSE POWD Place 1 Dose into the nose as needed. 10/07/19   Gretchen Short, NP  glucose blood test strip Use as instructed 05/13/21   Gretchen Short, NP  hydrOXYzine (ATARAX/VISTARIL) 10 MG tablet Take 1 tablet (10 mg total) by mouth 3 (three) times daily as needed. 05/11/21   [name], FNP  ibuprofen (ADVIL,MOTRIN) 200 MG tablet Take 400 mg by mouth every 6 (six) hours as needed for headache, mild pain or cramping.    [provider]  insulin aspart (NOVOLOG FLEXPEN) 100 UNIT/ML FlexPen INJECT UP TO 50 UNITS SUBCUTANEOUSLY DAILY 02/18/21   Gretchen Short, NP  insulin degludec (TRESIBA FLEXTOUCH) 100 UNIT/ML FlexTouch Pen Inject up to 50 units per day SubQ 04/29/21   Gretchen Short, NP  Insulin Pen Needle (BD PEN NEEDLE NANO U/F) 32G X 4 MM MISC Use with insulin pens 6x daily 10/22/20   Gretchen Short, NP  mupirocin ointment (BACTROBAN) 2 % Apply twice daily to underarm until bumps are gone 08/03/20   Alfonso Ramus T, FNP  norgestimate-ethinyl estradiol (SPRINTEC 28) 0.25-35 MG-MCG tablet TAKE 1 TABLET DAILY. DISCARD PLACEBOS AND TAKE ACTIVE PILLS FOR CONTINUOUS CYCLING 05/04/20   [name], FNP  ondansetron (ZOFRAN) 8 MG tablet Take 1 tablet (8 mg total) by mouth every 8 (eight) hours as needed for nausea or vomiting. 07/20/21   [name], FNP  prazosin (MINIPRESS) 2 MG capsule Take 1 capsule (2 mg total) by mouth at bedtime. 05/18/21   [name], FNP    Physical Exam: Vitals:   07/21/21 1426 07/21/21 1529 07/21/21 1530 07/21/21 1654  BP:  116/80 109/73 107/75 114/73  Pulse: 100 95 93 (!) 119  Resp: 15 (!) 33 (!) 21 (!) 29  Temp: 98.9 F (37.2 C)     TempSrc:      SpO2: 95% 100% 99% 98%     General:  Appears calm and comfortable and is in NAD Eyes:  PERRL, EOMI, normal lids, iris ENT:  grossly normal hearing, lips & tongue, moderately dry mm; appropriate dentition Neck:  no LAD, masses or thyromegaly Cardiovascular:  RRR, no m/r/g. No LE edema.  Respiratory:   CTA bilaterally with no wheezes/rales/rhonchi.  Normal to mildly increased respiratory effort. Abdomen:  soft, mildly diffusely tender, ND, NABS Skin:  no rash or  induration seen on limited exam Musculoskeletal:  grossly normal tone BUE/BLE, good ROM, no bony abnormality Psychiatric:  blunted mood and affect, speech sparse but somewhat appropriate Neurologic:  unable to effectively perform    Radiological Exams on Admission: Independently reviewed - see discussion in A/P where applicable  No results found.  EKG: Independently reviewed.  NSR with rate 93; nonspecific ST changes with no evidence of acute ischemia   Labs on Admission: I have personally reviewed the available labs and imaging studies at the time of the admission.  Pertinent labs:   VBG: 7.296/23.2/11.3 Na++ 130 Glucose 217 BUN 12/Creatinine 1.28/GFR >60; 11/0.69 on 10/24 CO2 9 Anion gap 20  Unremarkable CBC HCG <5   Assessment/Plan Principal Problem:   DKA (diabetic ketoacidosis) (HCC) Active Problems:   ADHD (attention deficit hyperactivity disorder), combined type   Adjustment disorder with mixed anxiety and depressed mood   Eating disorder    DKA -Patient with poor baseline control (last A1c was 10.4 on 9/15) -She moved out of her mom's house and in with friends and the next day presented with fatigue, lethargy, hyperglycemia, and ketonuria -Her mother attempted to get it under control with her protocol but without success -No indication of illness as source - she had a  reported fever of 100.4 but no other evidence of illness -Moderate to severe DKA on admission based on pH (mild), HCO3 9 (severe), anion gap (moderate to severe) patient alert but drowsy and mildly confused -Will admit to SDU with DKA protocol -Would recommend continuing insulin drip at least until morning regardless of rapidity of closure of gap and normalization of labs -She is primarily dehydrated and needs aggressive IV hydration; IV access has been a challenge so midline is being placed now -K+ slightly low and so potassium supplementation added -IVF at 150 cc/hr, LR until glucose <250 and then decrease rate to 125 and change to D5LR -Diabetes coordinator and dietician consulted  Mood d/o, eating d/o -Continue Welbutrin, Lexapro, hydroxyzine, prazosin -Needs close outpatient psych f/u    Note: This patient has been tested and is pending for the novel coronavirus COVID-19. The patient has been fully vaccinated against COVID-19.    DVT prophylaxis:  Lovenox  Code Status:  Full  Family Communication: Mother was present throughout evaluation Disposition Plan:  The patient is from: home  Anticipated d/c is to: home without [facility] services   Anticipated d/c date will depend on clinical response to treatment, likely 1-2 days  Patient is currently: acutely ill Consults called: Nutrition; Diabetes coordinator; TOC team Admission status:  Admit - It is my clinical opinion that admission to INPATIENT is reasonable and necessary because of the expectation that this patient will require hospital care that crosses at least 2 midnights to treat this condition based on the medical complexity of the problems presented.  Given the aforementioned information, the predictability of an adverse outcome is felt to be significant.     Jonah Blue MD [facility]   How to contact the [facility] Attending or Consulting provider 7A - 7P or covering provider during after hours 7P -7A, for this patient?   Check the care team in [facility] and look for a) attending/consulting [facility] provider listed and b) the [facility] team listed Log into [email or web address] and use [facility]'s universal password to access. If you do not have the password, please contact the hospital operator. Locate the [facility] provider you are looking for under [facility] and page to a number that you can be  directly reached. If you still have difficulty reaching the provider, please page the [facility] (Director on Call) for the Hospitalists listed on amion for assistance.   07/21/2021, 5:45 PM

## 2021-07-21 NOTE — Progress Notes (Signed)
Inpatient Diabetes Program Recommendations  AACE/ADA: New Consensus Statement on Inpatient Glycemic Control (2015)  Target Ranges:  Prepandial:   less than 140 mg/dL      Peak postprandial:   less than 180 mg/dL (1-2 hours)      Critically ill patients:  140 - 180 mg/dL   Lab Results  Component Value Date   GLUCAP 217 (H) 07/21/2021   HGBA1C 10.4 (A) 04/29/2021    Review of Glycemic Control  Latest Reference Range & Units 07/21/21 13:11  Glucose-Capillary 70 - 99 mg/dL 588 (H)  (H): Data is abnormally high  Latest Reference Range & Units 07/21/21 13:24  Beta-Hydroxybutyric Acid 0.05 - 0.27 mmol/L 5.35 (H)  (H): Data is abnormally high  Latest Reference Range & Units 07/21/21 13:24  CO2 22 - 32 mmol/L 9 (L)  (L): Data is abnormally low  Latest Reference Range & Units 07/21/21 13:24  Anion gap 5 - 15  20 (H)  (H): Data is abnormally high  Diabetes history: Type 1 DM Outpatient Diabetes medications: Tresiba 30 units QHS, Novolog SSI, Novolog 1 unit/5 CHO with meals   Inpatient Diabetes Program Recommendations:    IV insulin   Spoke with mother and patient at bedside.  Patient confirms above home DM medications.  She wears a Dexcom. She was at a large gathering on Friday and several people from the gathering have been diagnosed with the flu since.  She started feeling bad yesterday and was febrile.  No fever today.  She diagnosed with T1DM at age 20.  Mom is very knowledgeable of how to treat DKA at home and has several times in the past.  She has not been hospitalized in over 5 years with DKA.  Mom says her blood sugar was > 500 mg/dL and she gave 15 units of Novolog.  Her BS came down to 200's.  She decided to bring her to the hospital because she hasn't been able to keep liquids down in > 12 hrs.  Positive ketones at home.    She is followed by [facility] Al[facility].  She moved out of her mothers house on Monday.  Her boyfriend tragically passed away in a motorcycle accident 3 months  ago and she has been struggling.    Will continue to follow while inpatient.  Thank you, Dulce Sellar, RN, BSN Diabetes Coordinator Inpatient Diabetes Program [phone] (team pager from 8a-5p)

## 2021-07-21 NOTE — ED Notes (Signed)
IV team at bedside 

## 2021-07-22 ENCOUNTER — Other Ambulatory Visit (HOSPITAL_COMMUNITY): Payer: Self-pay

## 2021-07-22 DIAGNOSIS — R112 Nausea with vomiting, unspecified: Secondary | ICD-10-CM | POA: Diagnosis not present

## 2021-07-22 DIAGNOSIS — Z20822 Contact with and (suspected) exposure to covid-19: Secondary | ICD-10-CM | POA: Diagnosis not present

## 2021-07-22 DIAGNOSIS — Z794 Long term (current) use of insulin: Secondary | ICD-10-CM | POA: Diagnosis not present

## 2021-07-22 DIAGNOSIS — N179 Acute kidney failure, unspecified: Secondary | ICD-10-CM | POA: Diagnosis not present

## 2021-07-22 DIAGNOSIS — E1010 Type 1 diabetes mellitus with ketoacidosis without coma: Secondary | ICD-10-CM | POA: Diagnosis not present

## 2021-07-22 DIAGNOSIS — F902 Attention-deficit hyperactivity disorder, combined type: Secondary | ICD-10-CM | POA: Diagnosis not present

## 2021-07-22 DIAGNOSIS — Z7722 Contact with and (suspected) exposure to environmental tobacco smoke (acute) (chronic): Secondary | ICD-10-CM | POA: Diagnosis not present

## 2021-07-22 LAB — BASIC METABOLIC PANEL
Anion gap: 12 (ref 5–15)
Anion gap: 9 (ref 5–15)
BUN: 6 mg/dL (ref 6–20)
BUN: 5 mg/dL — ABNORMAL LOW (ref 6–20)
CO2: 18 mmol/L — ABNORMAL LOW (ref 22–32)
CO2: 21 mmol/L — ABNORMAL LOW (ref 22–32)
Calcium: 8 mg/dL — ABNORMAL LOW (ref 8.9–10.3)
Calcium: 7.8 mg/dL — ABNORMAL LOW (ref 8.9–10.3)
Chloride: 104 mmol/L (ref 98–111)
Chloride: 107 mmol/L (ref 98–111)
Creatinine, Ser: 0.92 mg/dL (ref 0.44–1.00)
Creatinine, Ser: 0.67 mg/dL (ref 0.44–1.00)
GFR, Estimated: >60 mL/min (ref >60)
GFR, Estimated: >60 mL/min (ref >60)
Glucose, Bld: 200 mg/dL — ABNORMAL HIGH (ref 70–99)
Glucose, Bld: 164 mg/dL — ABNORMAL HIGH (ref 70–99)
Potassium: 2.9 mmol/L — ABNORMAL LOW (ref 3.5–5.1)
Potassium: 3.3 mmol/L — ABNORMAL LOW (ref 3.5–5.1)
Sodium: 134 mmol/L — ABNORMAL LOW (ref 135–145)
Sodium: 137 mmol/L (ref 135–145)

## 2021-07-22 LAB — CBG MONITORING, ED
Glucose-Capillary: 216 mg/dL — ABNORMAL HIGH (ref 70–99)
Glucose-Capillary: 219 mg/dL — ABNORMAL HIGH (ref 70–99)
Glucose-Capillary: 177 mg/dL — ABNORMAL HIGH (ref 70–99)
Glucose-Capillary: 148 mg/dL — ABNORMAL HIGH (ref 70–99)
Glucose-Capillary: 153 mg/dL — ABNORMAL HIGH (ref 70–99)
Glucose-Capillary: 154 mg/dL — ABNORMAL HIGH (ref 70–99)
Glucose-Capillary: 186 mg/dL — ABNORMAL HIGH (ref 70–99)
Glucose-Capillary: 159 mg/dL — ABNORMAL HIGH (ref 70–99)

## 2021-07-22 LAB — HEMOGLOBIN A1C
Hgb A1c MFr Bld: 13.1 % — ABNORMAL HIGH (ref 4.8–5.6)
Mean Plasma Glucose: 329.27 mg/dL

## 2021-07-22 LAB — HIV ANTIBODY (ROUTINE TESTING W REFLEX): HIV Screen 4th Generation wRfx: NONREACTIVE

## 2021-07-22 LAB — BETA-HYDROXYBUTYRIC ACID
Beta-Hydroxybutyric Acid: 2.87 mmol/L — ABNORMAL HIGH (ref 0.05–0.27)
Beta-Hydroxybutyric Acid: 2.08 mmol/L — ABNORMAL HIGH (ref 0.05–0.27)

## 2021-07-22 MED ORDER — POTASSIUM CHLORIDE 10 MEQ/100ML IV SOLN
10.0000 meq | INTRAVENOUS | Status: AC
  Administered 2021-07-22 (×3): 10 meq via INTRAVENOUS
  Filled 2021-07-22 (×3): qty 100

## 2021-07-22 MED ORDER — TRESIBA FLEXTOUCH 100 UNIT/ML ~~LOC~~ SOPN: 30.0000 [IU] | PEN_INJECTOR | Freq: Every day | SUBCUTANEOUS | Status: DC

## 2021-07-22 MED ORDER — POTASSIUM CHLORIDE CRYS ER 20 MEQ PO TBCR
20.0000 meq | EXTENDED_RELEASE_TABLET | Freq: Once | ORAL | Status: AC
  Administered 2021-07-22: 20 meq via ORAL
  Filled 2021-07-22: qty 1

## 2021-07-22 MED ORDER — OSELTAMIVIR PHOSPHATE 75 MG PO CAPS
75.0000 mg | ORAL_CAPSULE | Freq: Two times a day (BID) | ORAL | Status: DC
  Administered 2021-07-22: 75 mg via ORAL
  Filled 2021-07-22 (×2): qty 1

## 2021-07-22 MED ORDER — INSULIN GLARGINE-YFGN 100 UNIT/ML ~~LOC~~ SOLN
25.0000 [IU] | Freq: Every day | SUBCUTANEOUS | Status: DC
  Administered 2021-07-22: 25 [IU] via SUBCUTANEOUS
  Filled 2021-07-22: qty 0.25

## 2021-07-22 MED ORDER — INSULIN ASPART 100 UNIT/ML IJ SOLN: 0.0000 [IU] | Freq: Three times a day (TID) | INTRAMUSCULAR | Status: DC

## 2021-07-22 MED ORDER — OSELTAMIVIR PHOSPHATE 75 MG PO CAPS
75.0000 mg | ORAL_CAPSULE | Freq: Two times a day (BID) | ORAL | 0 refills | Status: DC
  Filled 2021-07-22: qty 9, 5d supply, fill #0

## 2021-07-22 MED ORDER — INSULIN ASPART 100 UNIT/ML IJ SOLN
0.0000 [IU] | Freq: Three times a day (TID) | INTRAMUSCULAR | Status: DC
  Administered 2021-07-22: 2 [IU] via SUBCUTANEOUS

## 2021-07-22 NOTE — Discharge Summary (Signed)
PATIENT DETAILS Name: Kelli Davis Age: 20 y.o. Sex: female Date of Birth: [birth year] MRN: [id number]. Admitting Physician: Jonah Blue, MD GLO:VFIEPP, Jan Fireman, FNP  Admit Date: 07/21/2021 Discharge date: 07/22/2021  Recommendations for Outpatient Follow-up:  Follow up with PCP in 1-2 weeks Please obtain CMP/CBC in one week Please continue to optimize diabetic/insulin regimen.  Admitted From:  Home  Disposition: Home   Home Health: No  Equipment/Devices: None  Discharge Condition: Stable  CODE STATUS: FULL CODE  Diet recommendation:  Diet Order             Diet Carb Modified Fluid consistency: Thin; Room service appropriate? Yes  Diet effective now           Diet Carb Modified                    Brief Summary: See H&P, Labs, Consult and Test reports for all details in brief, patient is a 20 year old female with longstanding history of DM-1-who presented with DKA.  She apparently was having cough/nasal congestion/low-grade fever and diffuse myalgias for the past 2-3 days.  Per patient-she had recent exposure to numerous other people with similar symptoms.  Brief Hospital Course: DKA: Likely provoked by influenza A infection.  She was treated in the standard manner with IVF/IV insulin-once her anion gap closed-she was transitioned to subcutaneous insulin.  Influenza A: Continues to have cough/nasal congestion-clinically does not have pneumonia-appears well and on room air.  Continue Tamiflu on discharge.  DM-1 (A1c 13.1 on 12/8): Poor long-term control given A1c-she claims she has insulin and related supplies at home.  Plan is to continue her usual regimen-and have her follow-up with her outpatient physicians for further optimization.  Hypokalemia: Due to DKA/insulin infusion-repeated.  Please recheck at PCPs office.  Mood disorder: Stable-resume usual regimen on discharge.  Procedures None  Discharge Diagnoses:  Principal Problem:   DKA  (diabetic ketoacidosis) (HCC) Active Problems:   ADHD (attention deficit hyperactivity disorder), combined type   Adjustment disorder with mixed anxiety and depressed mood   Eating disorder   Discharge Instructions:  Activity:  As tolerated   Discharge Instructions     Diet Carb Modified   Complete by: As directed    Discharge instructions   Complete by: As directed    Follow with Primary MD  [name], FNP in 1-2 weeks  Please get a complete blood count and chemistry panel checked by your Primary MD at your next visit, and again as instructed by your Primary MD.  Get Medicines reviewed and adjusted: Please take all your medications with you for your next visit with your Primary MD  Laboratory/radiological data: Please request your Primary MD to go over all hospital tests and procedure/radiological results at the follow up, please ask your Primary MD to get all Hospital records sent to his/her office.  In some cases, they will be blood work, cultures and biopsy results pending at the time of your discharge. Please request that your primary care M.D. follows up on these results.  Also Note the following: If you experience worsening of your admission symptoms, develop shortness of breath, life threatening emergency, suicidal or homicidal thoughts you must seek medical attention immediately by calling 911 or calling your MD immediately  if symptoms less severe.  You must read complete instructions/literature along with all the possible adverse reactions/side effects for all the Medicines you take and that have been prescribed to you. Take any new Medicines after you have completely  understood and accpet all the possible adverse reactions/side effects.   Do not drive when taking Pain medications or sleeping medications (Benzodaizepines)  Do not take more than prescribed Pain, Sleep and Anxiety Medications. It is not advisable to combine anxiety,sleep and pain medications  without talking with your primary care practitioner  Special Instructions: If you have smoked or chewed Tobacco  in the last 2 yrs please stop smoking, stop any regular Alcohol  and or any Recreational drug use.  Wear Seat belts while driving.  Please note: You were cared for by a hospitalist during your hospital stay. Once you are discharged, your primary care physician will handle any further medical issues. Please note that NO REFILLS for any discharge medications will be authorized once you are discharged, as it is imperative that you return to your primary care physician (or establish a relationship with a primary care physician if you do not have one) for your post hospital discharge needs so that they can reassess your need for medications and monitor your lab values.   Your hemoglobin A1c is 13.1-please follow-up with your primary care practitioner for further optimization of your insulin regimen.    Please continue to take insulin as previously prescribed-please be compliant with insulin regimen.    Please keep a record of your CBG readings and take it to your next appointment with your primary care practitioner   Increase activity slowly   Complete by: As directed       Allergies as of 07/22/2021       Reactions   Penicillins Hives        Medication List     STOP taking these medications    doxycycline 100 MG capsule Commonly known as: VIBRAMYCIN   mupirocin ointment 2 % Commonly known as: BACTROBAN       TAKE these medications    Accu-Chek FastClix Lancets Misc Check sugar 10 x daily   Baqsimi One Pack 3 MG/DOSE Powd Generic drug: Glucagon Place 1 Dose into the nose as needed.   BD Pen Needle Nano U/F 32G X 4 MM Misc Generic drug: Insulin Pen Needle Use with insulin pens 6x daily   buPROPion 300 MG 24 hr tablet Commonly known as: WELLBUTRIN XL TAKE 1 TABLET BY MOUTH EVERY DAY What changed: how to take this   Dexcom G6 Sensor Misc 1 APPLICATION BY  DOES NOT APPLY ROUTE AS NEEDED.   Dexcom G6 Transmitter Misc 1 application by Does not apply route continuous as needed.   escitalopram 20 MG tablet Commonly known as: Lexapro Take 1 tablet (20 mg total) by mouth daily.   fluticasone 50 MCG/ACT nasal spray Commonly known as: FLONASE 1 spray daily as needed for allergies.   glucose blood test strip Use as instructed   hydrOXYzine 10 MG tablet Commonly known as: ATARAX Take 1 tablet (10 mg total) by mouth 3 (three) times daily as needed. What changed: reasons to take this   ibuprofen 200 MG tablet Commonly known as: ADVIL Take 400 mg by mouth every 6 (six) hours as needed for headache, mild pain or cramping.   norgestimate-ethinyl estradiol 0.25-35 MG-MCG tablet Commonly known as: Sprintec 28 TAKE 1 TABLET DAILY. DISCARD PLACEBOS AND TAKE ACTIVE PILLS FOR CONTINUOUS CYCLING What changed:  how much to take how to take this when to take this additional instructions   NovoLOG FlexPen 100 UNIT/ML FlexPen Generic drug: insulin aspart INJECT UP TO 50 UNITS SUBCUTANEOUSLY DAILY What changed:  how much to take when  to take this additional instructions   ondansetron 8 MG tablet Commonly known as: Zofran Take 1 tablet (8 mg total) by mouth every 8 (eight) hours as needed for nausea or vomiting.   oseltamivir 75 MG capsule Commonly known as: TAMIFLU Take 1 capsule (75 mg total) by mouth 2 (two) times daily.   prazosin 2 MG capsule Commonly known as: MINIPRESS Take 1 capsule (2 mg total) by mouth at bedtime.   Evaristo Bury FlexTouch 100 UNIT/ML FlexTouch Pen Generic drug: insulin degludec Inject 30 Units into the skin at bedtime. Inject up to 50 units per day SubQ        Follow-up Information     [name], FNP. Schedule an appointment as soon as possible for a visit in 1 week(s).   Specialty: Pediatrics Contact information: [address] [place] [address] [phone]                 Allergies  Allergen Reactions   Penicillins Hives      Consultations: None   Other Procedures/Studies: No results found.   TODAY-DAY OF DISCHARGE:  Subjective:   Jeniece Warning today has no headache,no chest abdominal pain,no new weakness tingling or numbness, feels much better wants to go home today.   Objective:   Blood pressure 107/64, pulse 86, temperature 98.2 F (36.8 C), temperature source Oral, resp. rate 14, SpO2 98 %.  Intake/Output Summary (Last 24 hours) at 07/22/2021 1346 Last data filed at 07/21/2021 2045 Gross per 24 hour  Intake 2999 ml  Output --  Net 2999 ml   There were no vitals filed for this visit.  Exam: Awake Alert, Oriented *3, No new F.N deficits, Normal affect [place].AT,PERRAL Supple Neck,No JVD, No cervical lymphadenopathy appriciated.  Symmetrical Chest wall movement, Good air movement bilaterally, CTAB RRR,No Gallops,Rubs or new Murmurs, No Parasternal Heave +ve B.Sounds, Abd Soft, Non tender, No organomegaly appriciated, No rebound -guarding or rigidity. No Cyanosis, Clubbing or edema, No new Rash or bruise   PERTINENT RADIOLOGIC STUDIES: No results found.   PERTINENT LAB RESULTS: CBC: Recent Labs    07/21/21 1324 07/21/21 1346  WBC 5.6  --   HGB 16.1* 16.7*  HCT 47.3* 49.0*  PLT 223  --    CMET CMP     Component Value Date/Time   NA 137 07/22/2021 0903   K 3.3 (L) 07/22/2021 0903   CL 107 07/22/2021 0903   CO2 21 (L) 07/22/2021 0903   GLUCOSE 164 (H) 07/22/2021 0903   BUN 5 (L) 07/22/2021 0903   CREATININE 0.67 07/22/2021 0903   CREATININE 0.69 05/18/2021 1519   CALCIUM 7.8 (L) 07/22/2021 0903   PROT 8.2 (H) 05/18/2021 1519   ALBUMIN 4.3 10/05/2016 1529   AST 20 05/18/2021 1519   ALT 25 05/18/2021 1519   ALKPHOS 110 10/05/2016 1529   BILITOT 0.6 05/18/2021 1519   GFRNONAA >60 07/22/2021 0903   GFRAA NOT CALCULATED 09/21/2016 0642    GFR Estimated Creatinine Clearance: 80.6 mL/min (by C-G formula based  on SCr of 0.67 mg/dL). No results for input(s): LIPASE, AMYLASE in the last 72 hours. No results for input(s): CKTOTAL, CKMB, CKMBINDEX, TROPONINI in the last 72 hours. Invalid input(s): POCBNP No results for input(s): DDIMER in the last 72 hours. Recent Labs    07/22/21 0245  HGBA1C 13.1*   No results for input(s): CHOL, HDL, LDLCALC, TRIG, CHOLHDL, LDLDIRECT in the last 72 hours. No results for input(s): TSH, T4TOTAL, T3FREE, THYROIDAB in the  last 72 hours.  Invalid input(s): FREET3 No results for input(s): VITAMINB12, FOLATE, FERRITIN, TIBC, IRON, RETICCTPCT in the last 72 hours. Coags: No results for input(s): INR in the last 72 hours.  Invalid input(s): PT Microbiology: Recent Results (from the past 240 hour(s))  Resp Panel by RT-PCR (Flu A&B, Covid) Nasopharyngeal Swab     Status: Abnormal   Collection Time: 07/21/21  1:21 PM   Specimen: Nasopharyngeal Swab; Nasopharyngeal(NP) swabs in vial transport medium  Result Value Ref Range Status   SARS Coronavirus 2 by RT PCR NEGATIVE NEGATIVE Final    Comment: (NOTE) SARS-CoV-2 target nucleic acids are NOT DETECTED.  The SARS-CoV-2 RNA is generally detectable in upper respiratory specimens during the acute phase of infection. The lowest concentration of SARS-CoV-2 viral copies this assay can detect is 138 copies/mL. A negative result does not preclude SARS-Cov-2 infection and should not be used as the sole basis for treatment or other patient management decisions. A negative result may occur with  improper specimen collection/handling, submission of specimen other than nasopharyngeal swab, presence of viral mutation(s) within the areas targeted by this assay, and inadequate number of viral copies(<138 copies/mL). A negative result must be combined with clinical observations, patient history, and epidemiological information. The expected result is Negative.  Fact Sheet for Patients:   BloggerCourse.com  Fact Sheet for Healthcare Providers:  SeriousBroker.it  This test is no t yet approved or cleared by the [place] FDA and  has been authorized for detection and/or diagnosis of SARS-CoV-2 by FDA under an Emergency Use Authorization (EUA). This EUA will remain  in effect (meaning this test can be used) for the duration of the COVID-19 declaration under Section 564(b)(1) of the Act, 21 U.S.C.section 360bbb-3(b)(1), unless the authorization is terminated  or revoked sooner.       Influenza A by PCR POSITIVE (A) NEGATIVE Final   Influenza B by PCR NEGATIVE NEGATIVE Final    Comment: (NOTE) The Xpert Xpress SARS-CoV-2/FLU/RSV plus assay is intended as an aid in the diagnosis of influenza from Nasopharyngeal swab specimens and should not be used as a sole basis for treatment. Nasal washings and aspirates are unacceptable for Xpert Xpress SARS-CoV-2/FLU/RSV testing.  Fact Sheet for Patients: BloggerCourse.com  Fact Sheet for Healthcare Providers: SeriousBroker.it  This test is not yet approved or cleared by the [place] FDA and has been authorized for detection and/or diagnosis of SARS-CoV-2 by FDA under an Emergency Use Authorization (EUA). This EUA will remain in effect (meaning this test can be used) for the duration of the COVID-19 declaration under Section 564(b)(1) of the Act, 21 U.S.C. section 360bbb-3(b)(1), unless the authorization is terminated or revoked.  Performed at [facility], [address] [place], [place] [address]     FURTHER DISCHARGE INSTRUCTIONS:  Get Medicines reviewed and adjusted: Please take all your medications with you for your next visit with your Primary MD  Laboratory/radiological data: Please request your Primary MD to go over all hospital tests and procedure/radiological results at the follow up, please ask  your Primary MD to get all Hospital records sent to his/her office.  In some cases, they will be blood work, cultures and biopsy results pending at the time of your discharge. Please request that your primary care M.D. goes through all the records of your hospital data and follows up on these results.  Also Note the following: If you experience worsening of your admission symptoms, develop shortness of breath, life threatening emergency, suicidal or homicidal  thoughts you must seek medical attention immediately by calling 911 or calling your MD immediately  if symptoms less severe.  You must read complete instructions/literature along with all the possible adverse reactions/side effects for all the Medicines you take and that have been prescribed to you. Take any new Medicines after you have completely understood and accpet all the possible adverse reactions/side effects.   Do not drive when taking Pain medications or sleeping medications (Benzodaizepines)  Do not take more than prescribed Pain, Sleep and Anxiety Medications. It is not advisable to combine anxiety,sleep and pain medications without talking with your primary care practitioner  Special Instructions: If you have smoked or chewed Tobacco  in the last 2 yrs please stop smoking, stop any regular Alcohol  and or any Recreational drug use.  Wear Seat belts while driving.  Please note: You were cared for by a hospitalist during your hospital stay. Once you are discharged, your primary care physician will handle any further medical issues. Please note that NO REFILLS for any discharge medications will be authorized once you are discharged, as it is imperative that you return to your primary care physician (or establish a relationship with a primary care physician if you do not have one) for your post hospital discharge needs so that they can reassess your need for medications and monitor your lab values.  Total Time spent coordinating  discharge including counseling, education and face to face time equals 25 minutes.  SignedJeoffrey Massed 07/22/2021 1:46 PM

## 2021-07-22 NOTE — ED Notes (Signed)
Pt eating breakfast at this time.  

## 2021-07-22 NOTE — ED Notes (Signed)
Insulin gtt and fluids discontinued per Jerral Ralph, MD order.

## 2021-07-22 NOTE — ED Notes (Signed)
Patient is resting comfortably. 

## 2021-07-22 NOTE — ED Notes (Signed)
Ghimire, MD at bedside. Per MD, ok for pt to continue insulin gtt with hypokalemia.

## 2021-07-22 NOTE — Progress Notes (Signed)
Seen and examined earlier this morning and then subsequently this afternoon after lunch. Admitted with DKA likely provoked from influenza A infection.  She was transitioned off insulin infusion earlier this morning once anion gap closed.  CBGs are stable on subcutaneous insulin.  She is tolerating advancement in diet.  No major issues throughout the day per nursing staff.  Spoke with patient-she is comfortable going home-as she feels much better.  See discharge summary for further details.

## 2021-07-22 NOTE — ED Notes (Signed)
Pt verbalizes understanding of discharge instructions. Opportunity for questions and answers were provided. Pt discharged from the ED.   ?

## 2021-07-22 NOTE — ED Notes (Signed)
Insulin rate change to 1.3 units/hr. Verified with Sophronia Simas, RN

## 2021-07-26 ENCOUNTER — Ambulatory Visit (INDEPENDENT_AMBULATORY_CARE_PROVIDER_SITE_OTHER): Payer: BC Managed Care – PPO | Admitting: Family

## 2021-07-26 NOTE — Progress Notes (Deleted)
Pediatric Endocrinology Diabetes Consultation Follow-up Visit  Kelli [place] 11-Oct-2000 [id number]  Chief Complaint: Follow-up type 1 diabetes   [name], FNP   HPI: Kelli [place]  is a 20 y.o. female presenting for follow-up of type 1 diabetes. she is accompanied to this visit by her mother and father.   1. Kelli [place] was diagnosed with type 1 diabetes at age 89. At that time she was hospitalized at [facility] center and was in DKA. She was in the ICU for 2 days. She was initially followed by Dr. Langston Masker in [place] but transferred to this clinic after Dr. Langston Masker retired. She has been admitted in DKA two additional times since diagnosis. She has been on pump therapy since age 50.  2. Since last visit to [facility] on 102022, she has been "ok". She has hospitalized on 07/2021 with DKA after getting influenza a few days prior. Her blood sugars were running higher due to influenza and she was unable to hold down fluids.     She has been meeting with Dr. Ladona Ridgel weekly to discuss blood sugars. She realized she was frequently forgetting to take her insulin. She is taking [place] more often but estimates she forgets a few times per week, usually because she falls asleep. Forgets Novolog at times when she is busy at work. She also states she does not eat until late at night because of her work schedule. . She is looking for a new job and has moved out from living with her mother (currently living with friends but needs to find permanent residence).   She has been following up with Violeta Gelinas, NP at [facility] about once per month. She reports sleeping better without nightmares but continues to be depressed.   Insulin regimen: 28 units of Tresiba(takes 30 when on menstrual cycle) Novolog 120/30/5 Hypoglycemia: Able to feel low blood sugars.  No glucagon needed recently.  Dexcom CGm Download    Med-alert ID: Not currently wearing. Injection sites: arms and hips  Annual labs  due: 04/2021 Ophthalmology due: 2019. Discussed importance of annual dilated eye exam.     3. ROS: Greater than 10 systems reviewed with pertinent positives listed in HPI, otherwise neg. Constitutional: Sleep is improving  Eyes: No changes in vision, denies blurry vision. Wears glasses.  Ears/Nose/Mouth/Throat: No difficulty swallowing. No neck pain  Cardiovascular: No palpitations, denies tachycardia  Respiratory: No increased work of breathing, No SOB  Gastrointestinal: No constipation or diarrhea. No abdominal pain Genitourinary: No nocturia, no polyuria Endocrine: No polydipsia.  No hyperpigmentation Psychiatric: Normal affect.+ anxiety and depression. Currently on Lexapro.   Past Medical History:   Past Medical History:  Diagnosis Date   ADD (attention deficit disorder)    Eating disorder    Febrile seizure (HCC)    History of eye surgery    Hypoglycemia associated with diabetes (HCC)    Physical growth delay    Type 1 diabetes mellitus not at goal [facility])     Medications:  Outpatient Encounter Medications as of 07/26/2021  Medication Sig   ACCU-CHEK FASTCLIX LANCETS MISC Check sugar 10 x daily   buPROPion (WELLBUTRIN XL) 300 MG 24 hr tablet TAKE 1 TABLET BY MOUTH EVERY DAY (Patient taking differently: 300 mg daily.)   Continuous Blood Gluc Sensor (DEXCOM G6 SENSOR) MISC 1 APPLICATION BY DOES NOT APPLY ROUTE AS NEEDED.   Continuous Blood Gluc Transmit (DEXCOM G6 TRANSMITTER) MISC 1 application by Does not apply route continuous as needed.   escitalopram (LEXAPRO) 20 MG  tablet Take 1 tablet (20 mg total) by mouth daily.   fluticasone (FLONASE) 50 MCG/ACT nasal spray 1 spray daily as needed for allergies.   Glucagon (BAQSIMI ONE PACK) 3 MG/DOSE POWD Place 1 Dose into the nose as needed.   glucose blood test strip Use as instructed   hydrOXYzine (ATARAX/VISTARIL) 10 MG tablet Take 1 tablet (10 mg total) by mouth 3 (three) times daily as needed. (Patient taking differently: Take  10 mg by mouth 3 (three) times daily as needed for anxiety.)   ibuprofen (ADVIL,MOTRIN) 200 MG tablet Take 400 mg by mouth every 6 (six) hours as needed for headache, mild pain or cramping.   insulin aspart (NOVOLOG FLEXPEN) 100 UNIT/ML FlexPen INJECT UP TO 50 UNITS SUBCUTANEOUSLY DAILY (Patient taking differently: 0-50 Units 3 (three) times daily with meals. Per sliding scale)   insulin degludec (TRESIBA FLEXTOUCH) 100 UNIT/ML FlexTouch Pen Inject 30 Units into the skin at bedtime. Inject up to 50 units per day SubQ   Insulin Pen Needle (BD PEN NEEDLE NANO U/F) 32G X 4 MM MISC Use with insulin pens 6x daily   norgestimate-ethinyl estradiol (SPRINTEC 28) 0.25-35 MG-MCG tablet TAKE 1 TABLET DAILY. DISCARD PLACEBOS AND TAKE ACTIVE PILLS FOR CONTINUOUS CYCLING (Patient taking differently: Take 1 tablet by mouth every evening.)   ondansetron (ZOFRAN) 8 MG tablet Take 1 tablet (8 mg total) by mouth every 8 (eight) hours as needed for nausea or vomiting.   oseltamivir (TAMIFLU) 75 MG capsule Take 1 capsule (75 mg total) by mouth 2 (two) times daily.   prazosin (MINIPRESS) 2 MG capsule Take 1 capsule (2 mg total) by mouth at bedtime.   No facility-administered encounter medications on file as of 07/26/2021.    Allergies: Allergies  Allergen Reactions   Penicillins Hives    Surgical History: Past Surgical History:  Procedure Laterality Date   EYE MUSCLE SURGERY      Family History:  Family History  Problem Relation Age of Onset   Cancer Maternal Grandmother    Hypertension Maternal Grandfather    Depression Maternal Grandfather    Diabetes Paternal Grandfather    Anxiety disorder Father    Depression Mother    Anxiety disorder Sister      Social History: Lives with: Living with friends.    Physical Exam:  There were no vitals filed for this visit.    There were no vitals taken for this visit. Body mass index: body mass index is unknown because there is no height or weight on  file. Growth percentile SmartLinks can only be used for patients less than 49 years old.  Ht Readings from Last 3 Encounters:  07/15/21 5' (1.524 m)  06/17/21 4' 11.45" (1.51 m)  05/25/21 4' 11.84" (1.52 m)   Wt Readings from Last 3 Encounters:  07/15/21 110 lb (49.9 kg)  06/17/21 108 lb 6.4 oz (49.2 kg)  05/25/21 107 lb 6.4 oz (48.7 kg)   Physical Exam  General: Well developed, well nourished female in no acute distress.  Head: Normocephalic, atraumatic.   Eyes:  Pupils equal and round. EOMI.   Sclera white.  No eye drainage.   Cardiovascular: No obvious cyanosis.  Respiratory: No increased work of breathing.   Skin: warm, dry.  No rash or lesions. Neurologic: alert and oriented, normal speech, no tremor   Labs: Results for orders placed or performed during the hospital encounter of 07/21/21  Resp Panel by RT-PCR (Flu A&B, Covid) Nasopharyngeal Swab   Specimen: Nasopharyngeal Swab; Nasopharyngeal(NP)  swabs in vial transport medium  Result Value Ref Range   SARS Coronavirus 2 by RT PCR NEGATIVE NEGATIVE   Influenza A by PCR POSITIVE (A) NEGATIVE   Influenza B by PCR NEGATIVE NEGATIVE  Basic metabolic panel  Result Value Ref Range   Sodium 130 (L) 135 - 145 mmol/L   Potassium 3.9 3.5 - 5.1 mmol/L   Chloride 101 98 - 111 mmol/L   CO2 9 (L) 22 - 32 mmol/L   Glucose, Bld 217 (H) 70 - 99 mg/dL   BUN 12 6 - 20 mg/dL   Creatinine, Ser 1.63 (H) 0.44 - 1.00 mg/dL   Calcium 9.1 8.9 - 84.6 mg/dL   GFR, Estimated >65 >99 mL/min   Anion gap 20 (H) 5 - 15  Basic metabolic panel  Result Value Ref Range   Sodium 134 (L) 135 - 145 mmol/L   Potassium 2.9 (L) 3.5 - 5.1 mmol/L   Chloride 104 98 - 111 mmol/L   CO2 18 (L) 22 - 32 mmol/L   Glucose, Bld 200 (H) 70 - 99 mg/dL   BUN 6 6 - 20 mg/dL   Creatinine, Ser 3.57 0.44 - 1.00 mg/dL   Calcium 8.0 (L) 8.9 - 10.3 mg/dL   GFR, Estimated >01 >77 mL/min   Anion gap 12 5 - 15  Beta-hydroxybutyric acid  Result Value Ref Range    Beta-Hydroxybutyric Acid 5.35 (H) 0.05 - 0.27 mmol/L  Beta-hydroxybutyric acid  Result Value Ref Range   Beta-Hydroxybutyric Acid 2.87 (H) 0.05 - 0.27 mmol/L  CBC with Differential (PNL)  Result Value Ref Range   WBC 5.6 4.0 - 10.5 K/uL   RBC 5.11 3.87 - 5.11 MIL/uL   Hemoglobin 16.1 (H) 12.0 - 15.0 g/dL   HCT 93.9 (H) 03.0 - 09.2 %   MCV 92.6 80.0 - 100.0 fL   MCH 31.5 26.0 - 34.0 pg   MCHC 34.0 30.0 - 36.0 g/dL   RDW 33.0 07.6 - 22.6 %   Platelets 223 150 - 400 K/uL   nRBC 0.4 (H) 0.0 - 0.2 %   Neutrophils Relative % 83 %   Neutro Abs 4.6 1.7 - 7.7 K/uL   Lymphocytes Relative 8 %   Lymphs Abs 0.5 (L) 0.7 - 4.0 K/uL   Monocytes Relative 9 %   Monocytes Absolute 0.5 0.1 - 1.0 K/uL   Eosinophils Relative 0 %   Eosinophils Absolute 0.0 0.0 - 0.5 K/uL   Basophils Relative 0 %   Basophils Absolute 0.0 0.0 - 0.1 K/uL   Immature Granulocytes 0 %   Abs Immature Granulocytes 0.02 0.00 - 0.07 K/uL  HIV Antibody (routine testing w rflx)  Result Value Ref Range   HIV Screen 4th Generation wRfx Non Reactive Non Reactive  Hemoglobin A1c  Result Value Ref Range   Hgb A1c MFr Bld 13.1 (H) 4.8 - 5.6 %   Mean Plasma Glucose 329.27 mg/dL  Basic metabolic panel  Result Value Ref Range   Sodium 137 135 - 145 mmol/L   Potassium 3.3 (L) 3.5 - 5.1 mmol/L   Chloride 107 98 - 111 mmol/L   CO2 21 (L) 22 - 32 mmol/L   Glucose, Bld 164 (H) 70 - 99 mg/dL   BUN 5 (L) 6 - 20 mg/dL   Creatinine, Ser 3.33 0.44 - 1.00 mg/dL   Calcium 7.8 (L) 8.9 - 10.3 mg/dL   GFR, Estimated >54 >56 mL/min   Anion gap 9 5 - 15  Beta-hydroxybutyric acid  Result  Value Ref Range   Beta-Hydroxybutyric Acid 2.08 (H) 0.05 - 0.27 mmol/L  CBG monitoring, ED  Result Value Ref Range   Glucose-Capillary 217 (H) 70 - 99 mg/dL  I-Stat beta hCG blood, ED  Result Value Ref Range   I-stat hCG, quantitative <5.0 <5 mIU/mL   Comment 3          CBG monitoring, ED  Result Value Ref Range   Glucose-Capillary 191 (H) 70 - 99  mg/dL  I-Stat Venous Blood Gas, ED  Result Value Ref Range   pH, Ven 7.296 7.250 - 7.430   pCO2, Ven 23.2 (L) 44.0 - 60.0 mmHg   pO2, Ven 103.0 (H) 32.0 - 45.0 mmHg   Bicarbonate 11.3 (L) 20.0 - 28.0 mmol/L   TCO2 12 (L) 22 - 32 mmol/L   O2 Saturation 97.0 %   Acid-base deficit 13.0 (H) 0.0 - 2.0 mmol/L   Sodium 132 (L) 135 - 145 mmol/L   Potassium 4.1 3.5 - 5.1 mmol/L   Calcium, Ion 1.18 1.15 - 1.40 mmol/L   HCT 49.0 (H) 36.0 - 46.0 %   Hemoglobin 16.7 (H) 12.0 - 15.0 g/dL   Sample type VENOUS   CBG monitoring, ED  Result Value Ref Range   Glucose-Capillary 338 (H) 70 - 99 mg/dL  CBG monitoring, ED  Result Value Ref Range   Glucose-Capillary 216 (H) 70 - 99 mg/dL  CBG monitoring, ED  Result Value Ref Range   Glucose-Capillary 180 (H) 70 - 99 mg/dL  CBG monitoring, ED  Result Value Ref Range   Glucose-Capillary 148 (H) 70 - 99 mg/dL  CBG monitoring, ED  Result Value Ref Range   Glucose-Capillary 163 (H) 70 - 99 mg/dL  CBG monitoring, ED  Result Value Ref Range   Glucose-Capillary 216 (H) 70 - 99 mg/dL  CBG monitoring, ED  Result Value Ref Range   Glucose-Capillary 219 (H) 70 - 99 mg/dL  CBG monitoring, ED  Result Value Ref Range   Glucose-Capillary 177 (H) 70 - 99 mg/dL  CBG monitoring, ED  Result Value Ref Range   Glucose-Capillary 148 (H) 70 - 99 mg/dL  CBG monitoring, ED  Result Value Ref Range   Glucose-Capillary 153 (H) 70 - 99 mg/dL  CBG monitoring, ED  Result Value Ref Range   Glucose-Capillary 154 (H) 70 - 99 mg/dL   Comment 1 Notify RN    Comment 2 Document in Chart   CBG monitoring, ED  Result Value Ref Range   Glucose-Capillary 186 (H) 70 - 99 mg/dL   Comment 1 Notify RN    Comment 2 Document in Chart   CBG monitoring, ED  Result Value Ref Range   Glucose-Capillary 159 (H) 70 - 99 mg/dL     Assessment/Plan: Kelli [place] is a 20 y.o. female with type 1 diabetes on MDI and CGM therapy. Making improvements with close monitoring by multidiscipinary  team but she continues to have frequent hyperglycemia which is due to missed insulin doses. Situation is further complicated by being kicked out of her moms house and living with friends.     1-3. DM w/o complication type I, uncontrolled (HCC)/hyperglycemia/Insulin dose change  - Continu 28 units of Tresiba.  - Novolog 120/30/5 plan   - At bedtime and 2am: 1 unit for every 50 points >150 and 1 unit for every 10 grams of carbs.  - Reviewed insulin pump and CGM download. Discussed trends and patterns.  - Rotate pump sites to prevent scar tissue.  - bolus  15 minutes prior to eating to limit blood sugar spikes.  - Reviewed carb counting and importance of accurate carb counting.  - Discussed signs and symptoms of hypoglycemia. Always have glucose available.  - POCT glucose and hemoglobin A1c  - Reviewed growth chart.    4. Disordered eating - Discussed effects of binging/purging on blood sugars.  - Encouraged follow up with RD and counseling.   5. Depression/Anxiety  - Lexapro and Wellbutrin daily  - Close follow up with [facility].  - Praise for improvements but discussed her continued struggles with depression and adherence to medication.   6. Mixed hyperlipidemia  - Take 2000 units of fish oil daily.  - Low cholesterol diet.  - Fast lipid panel twice per year.     Follow-up:   1 months. Follow up with Dr. Ladona Ridgel for blood sugar call per patient request.    When a patient is on insulin, intensive monitoring of blood glucose levels is necessary to avoid hyperglycemia and hypoglycemia. Severe hyperglycemia/hypoglycemia can lead to hospital admissions and be life threatening.     Gretchen Short,  FNP-C  Pediatric Specialist  [address]  [place], [address]  Tele: [phone]

## 2021-07-27 ENCOUNTER — Encounter (HOSPITAL_COMMUNITY): Payer: Self-pay | Admitting: Emergency Medicine

## 2021-07-27 ENCOUNTER — Inpatient Hospital Stay (HOSPITAL_COMMUNITY)
Admission: EM | Admit: 2021-07-27 | Discharge: 2021-07-29 | DRG: 177 | Disposition: A | Discharge status: Home / Self Care | Payer: BC Managed Care – PPO | Attending: Internal Medicine | Admitting: Internal Medicine

## 2021-07-27 ENCOUNTER — Inpatient Hospital Stay (HOSPITAL_COMMUNITY): Payer: BC Managed Care – PPO

## 2021-07-27 ENCOUNTER — Other Ambulatory Visit: Payer: Self-pay

## 2021-07-27 DIAGNOSIS — Z794 Long term (current) use of insulin: Secondary | ICD-10-CM | POA: Diagnosis not present

## 2021-07-27 DIAGNOSIS — R000 Tachycardia, unspecified: Secondary | ICD-10-CM | POA: Diagnosis not present

## 2021-07-27 DIAGNOSIS — F4323 Adjustment disorder with mixed anxiety and depressed mood: Secondary | ICD-10-CM | POA: Diagnosis present

## 2021-07-27 DIAGNOSIS — E1010 Type 1 diabetes mellitus with ketoacidosis without coma: Secondary | ICD-10-CM | POA: Diagnosis present

## 2021-07-27 DIAGNOSIS — Z880 Allergy status to penicillin: Secondary | ICD-10-CM

## 2021-07-27 DIAGNOSIS — Z833 Family history of diabetes mellitus: Secondary | ICD-10-CM | POA: Diagnosis not present

## 2021-07-27 DIAGNOSIS — Z8659 Personal history of other mental and behavioral disorders: Secondary | ICD-10-CM

## 2021-07-27 DIAGNOSIS — Z818 Family history of other mental and behavioral disorders: Secondary | ICD-10-CM

## 2021-07-27 DIAGNOSIS — E876 Hypokalemia: Secondary | ICD-10-CM | POA: Diagnosis not present

## 2021-07-27 DIAGNOSIS — U071 COVID-19: Secondary | ICD-10-CM | POA: Diagnosis not present

## 2021-07-27 DIAGNOSIS — E860 Dehydration: Secondary | ICD-10-CM | POA: Diagnosis present

## 2021-07-27 DIAGNOSIS — N179 Acute kidney failure, unspecified: Secondary | ICD-10-CM | POA: Diagnosis not present

## 2021-07-27 DIAGNOSIS — F909 Attention-deficit hyperactivity disorder, unspecified type: Secondary | ICD-10-CM | POA: Diagnosis not present

## 2021-07-27 DIAGNOSIS — F39 Unspecified mood [affective] disorder: Secondary | ICD-10-CM | POA: Diagnosis present

## 2021-07-27 DIAGNOSIS — E1110 Type 2 diabetes mellitus with ketoacidosis without coma: Secondary | ICD-10-CM | POA: Diagnosis present

## 2021-07-27 DIAGNOSIS — J101 Influenza due to other identified influenza virus with other respiratory manifestations: Secondary | ICD-10-CM | POA: Diagnosis not present

## 2021-07-27 DIAGNOSIS — R112 Nausea with vomiting, unspecified: Secondary | ICD-10-CM | POA: Diagnosis not present

## 2021-07-27 LAB — CBC WITH DIFFERENTIAL/PLATELET
Abs Immature Granulocytes: 0.03 10*3/uL (ref 0.00–0.07)
Basophils Absolute: 0 10*3/uL (ref 0.0–0.1)
Basophils Relative: 0 %
Eosinophils Absolute: 0 10*3/uL (ref 0.0–0.5)
Eosinophils Relative: 0 %
HCT: 51.6 % — ABNORMAL HIGH (ref 36.0–46.0)
Hemoglobin: 17 g/dL — ABNORMAL HIGH (ref 12.0–15.0)
Immature Granulocytes: 1 %
Lymphocytes Relative: 18 %
Lymphs Abs: 1 10*3/uL (ref 0.7–4.0)
MCH: 31.5 pg (ref 26.0–34.0)
MCHC: 32.9 g/dL (ref 30.0–36.0)
MCV: 95.7 fL (ref 80.0–100.0)
Monocytes Absolute: 0.2 10*3/uL (ref 0.1–1.0)
Monocytes Relative: 3 %
Neutro Abs: 4.2 10*3/uL (ref 1.7–7.7)
Neutrophils Relative %: 78 %
Platelets: 302 10*3/uL (ref 150–400)
RBC: 5.39 MIL/uL — ABNORMAL HIGH (ref 3.87–5.11)
RDW: 12.3 % (ref 11.5–15.5)
WBC: 5.4 10*3/uL (ref 4.0–10.5)
nRBC: 0 % (ref 0.0–0.2)

## 2021-07-27 LAB — COMPREHENSIVE METABOLIC PANEL
ALT: 24 U/L (ref 0–44)
AST: 22 U/L (ref 15–41)
Albumin: 4 g/dL (ref 3.5–5.0)
Alkaline Phosphatase: 99 U/L (ref 38–126)
Anion gap: 25 — ABNORMAL HIGH (ref 5–15)
BUN: 11 mg/dL (ref 6–20)
CO2: 8 mmol/L — ABNORMAL LOW (ref 22–32)
Calcium: 9 mg/dL (ref 8.9–10.3)
Chloride: 99 mmol/L (ref 98–111)
Creatinine, Ser: 1.61 mg/dL — ABNORMAL HIGH (ref 0.44–1.00)
GFR, Estimated: 46 mL/min — ABNORMAL LOW (ref >60)
Glucose, Bld: 475 mg/dL — ABNORMAL HIGH (ref 70–99)
Potassium: 4.6 mmol/L (ref 3.5–5.1)
Sodium: 132 mmol/L — ABNORMAL LOW (ref 135–145)
Total Bilirubin: 1.9 mg/dL — ABNORMAL HIGH (ref 0.3–1.2)
Total Protein: 8 g/dL (ref 6.5–8.1)

## 2021-07-27 LAB — URINALYSIS, ROUTINE W REFLEX MICROSCOPIC
Glucose, UA: >=500 mg/dL — AB
Hgb urine dipstick: NEGATIVE
Ketones, ur: >80 mg/dL — AB
Leukocytes,Ua: NEGATIVE
Nitrite: NEGATIVE
Protein, ur: NEGATIVE mg/dL
Specific Gravity, Urine: >1.03 — ABNORMAL HIGH (ref 1.005–1.030)
pH: 5.5 (ref 5.0–8.0)

## 2021-07-27 LAB — BASIC METABOLIC PANEL
Anion gap: 21 — ABNORMAL HIGH (ref 5–15)
Anion gap: 20 — ABNORMAL HIGH (ref 5–15)
Anion gap: 11 (ref 5–15)
BUN: 9 mg/dL (ref 6–20)
BUN: 6 mg/dL (ref 6–20)
BUN: <5 mg/dL — ABNORMAL LOW (ref 6–20)
CO2: 9 mmol/L — ABNORMAL LOW (ref 22–32)
CO2: 9 mmol/L — ABNORMAL LOW (ref 22–32)
CO2: 16 mmol/L — ABNORMAL LOW (ref 22–32)
Calcium: 8.3 mg/dL — ABNORMAL LOW (ref 8.9–10.3)
Calcium: 8.6 mg/dL — ABNORMAL LOW (ref 8.9–10.3)
Calcium: 8 mg/dL — ABNORMAL LOW (ref 8.9–10.3)
Chloride: 104 mmol/L (ref 98–111)
Chloride: 104 mmol/L (ref 98–111)
Chloride: 108 mmol/L (ref 98–111)
Creatinine, Ser: 1.29 mg/dL — ABNORMAL HIGH (ref 0.44–1.00)
Creatinine, Ser: 1.12 mg/dL — ABNORMAL HIGH (ref 0.44–1.00)
Creatinine, Ser: 0.93 mg/dL (ref 0.44–1.00)
GFR, Estimated: >60 mL/min (ref >60)
GFR, Estimated: >60 mL/min (ref >60)
GFR, Estimated: >60 mL/min (ref >60)
Glucose, Bld: 244 mg/dL — ABNORMAL HIGH (ref 70–99)
Glucose, Bld: 248 mg/dL — ABNORMAL HIGH (ref 70–99)
Glucose, Bld: 174 mg/dL — ABNORMAL HIGH (ref 70–99)
Potassium: 4.7 mmol/L (ref 3.5–5.1)
Potassium: 4.4 mmol/L (ref 3.5–5.1)
Potassium: 3.6 mmol/L (ref 3.5–5.1)
Sodium: 134 mmol/L — ABNORMAL LOW (ref 135–145)
Sodium: 133 mmol/L — ABNORMAL LOW (ref 135–145)
Sodium: 135 mmol/L (ref 135–145)

## 2021-07-27 LAB — I-STAT CHEM 8, ED
BUN: 11 mg/dL (ref 6–20)
Calcium, Ion: 1.14 mmol/L — ABNORMAL LOW (ref 1.15–1.40)
Chloride: 105 mmol/L (ref 98–111)
Creatinine, Ser: 0.5 mg/dL (ref 0.44–1.00)
Glucose, Bld: 463 mg/dL — ABNORMAL HIGH (ref 70–99)
HCT: 51 % — ABNORMAL HIGH (ref 36.0–46.0)
Hemoglobin: 17.3 g/dL — ABNORMAL HIGH (ref 12.0–15.0)
Potassium: 4.6 mmol/L (ref 3.5–5.1)
Sodium: 132 mmol/L — ABNORMAL LOW (ref 135–145)
TCO2: 10 mmol/L — ABNORMAL LOW (ref 22–32)

## 2021-07-27 LAB — CBG MONITORING, ED
Glucose-Capillary: 139 mg/dL — ABNORMAL HIGH (ref 70–99)
Glucose-Capillary: 161 mg/dL — ABNORMAL HIGH (ref 70–99)
Glucose-Capillary: 163 mg/dL — ABNORMAL HIGH (ref 70–99)
Glucose-Capillary: 168 mg/dL — ABNORMAL HIGH (ref 70–99)
Glucose-Capillary: 184 mg/dL — ABNORMAL HIGH (ref 70–99)
Glucose-Capillary: 189 mg/dL — ABNORMAL HIGH (ref 70–99)
Glucose-Capillary: 208 mg/dL — ABNORMAL HIGH (ref 70–99)
Glucose-Capillary: 221 mg/dL — ABNORMAL HIGH (ref 70–99)
Glucose-Capillary: 225 mg/dL — ABNORMAL HIGH (ref 70–99)
Glucose-Capillary: 234 mg/dL — ABNORMAL HIGH (ref 70–99)
Glucose-Capillary: 255 mg/dL — ABNORMAL HIGH (ref 70–99)
Glucose-Capillary: 274 mg/dL — ABNORMAL HIGH (ref 70–99)
Glucose-Capillary: 281 mg/dL — ABNORMAL HIGH (ref 70–99)
Glucose-Capillary: 412 mg/dL — ABNORMAL HIGH (ref 70–99)
Glucose-Capillary: 438 mg/dL — ABNORMAL HIGH (ref 70–99)
Glucose-Capillary: 477 mg/dL — ABNORMAL HIGH (ref 70–99)
Glucose-Capillary: 481 mg/dL — ABNORMAL HIGH (ref 70–99)

## 2021-07-27 LAB — RESP PANEL BY RT-PCR (FLU A&B, COVID) ARPGX2
Influenza A by PCR: POSITIVE — AB
Influenza B by PCR: NEGATIVE
SARS Coronavirus 2 by RT PCR: POSITIVE — AB

## 2021-07-27 LAB — BETA-HYDROXYBUTYRIC ACID
Beta-Hydroxybutyric Acid: >8 mmol/L — ABNORMAL HIGH (ref 0.05–0.27)
Beta-Hydroxybutyric Acid: 7.53 mmol/L — ABNORMAL HIGH (ref 0.05–0.27)
Beta-Hydroxybutyric Acid: 4.06 mmol/L — ABNORMAL HIGH (ref 0.05–0.27)

## 2021-07-27 LAB — FERRITIN: Ferritin: 128 ng/mL (ref 11–307)

## 2021-07-27 LAB — URINALYSIS, MICROSCOPIC (REFLEX)

## 2021-07-27 LAB — I-STAT BETA HCG BLOOD, ED (MC, WL, AP ONLY): I-stat hCG, quantitative: <5 m[IU]/mL (ref <5)

## 2021-07-27 LAB — I-STAT VENOUS BLOOD GAS, ED
Acid-base deficit: 20 mmol/L — ABNORMAL HIGH (ref 0.0–2.0)
Bicarbonate: 7.7 mmol/L — ABNORMAL LOW (ref 20.0–28.0)
Calcium, Ion: 1.14 mmol/L — ABNORMAL LOW (ref 1.15–1.40)
HCT: 50 % — ABNORMAL HIGH (ref 36.0–46.0)
Hemoglobin: 17 g/dL — ABNORMAL HIGH (ref 12.0–15.0)
O2 Saturation: 91 %
Potassium: 4.7 mmol/L (ref 3.5–5.1)
Sodium: 132 mmol/L — ABNORMAL LOW (ref 135–145)
TCO2: 8 mmol/L — ABNORMAL LOW (ref 22–32)
pCO2, Ven: 24.2 mmHg — ABNORMAL LOW (ref 44.0–60.0)
pH, Ven: 7.108 — CL (ref 7.250–7.430)
pO2, Ven: 80 mmHg — ABNORMAL HIGH (ref 32.0–45.0)

## 2021-07-27 LAB — LACTATE DEHYDROGENASE: LDH: 207 U/L — ABNORMAL HIGH (ref 98–192)

## 2021-07-27 LAB — PROCALCITONIN: Procalcitonin: <0.1 ng/mL

## 2021-07-27 LAB — FIBRINOGEN: Fibrinogen: 321 mg/dL (ref 210–475)

## 2021-07-27 LAB — LIPASE, BLOOD: Lipase: 85 U/L — ABNORMAL HIGH (ref 11–51)

## 2021-07-27 LAB — C-REACTIVE PROTEIN: CRP: 1.5 mg/dL — ABNORMAL HIGH (ref <1.0)

## 2021-07-27 LAB — D-DIMER, QUANTITATIVE: D-Dimer, Quant: 0.59 ug/mL-FEU — ABNORMAL HIGH (ref 0.00–0.50)

## 2021-07-27 MED ORDER — ENOXAPARIN SODIUM 40 MG/0.4ML IJ SOSY
40.0000 mg | PREFILLED_SYRINGE | INTRAMUSCULAR | Status: DC
  Administered 2021-07-27 – 2021-07-28 (×2): 40 mg via SUBCUTANEOUS
  Filled 2021-07-27: qty 0.4

## 2021-07-27 MED ORDER — LACTATED RINGERS IV SOLN: INTRAVENOUS | Status: DC

## 2021-07-27 MED ORDER — PRAZOSIN HCL 2 MG PO CAPS
2.0000 mg | ORAL_CAPSULE | Freq: Every day | ORAL | Status: DC
  Administered 2021-07-27 – 2021-07-28 (×2): 2 mg via ORAL
  Filled 2021-07-27 (×4): qty 1

## 2021-07-27 MED ORDER — ACETAMINOPHEN 325 MG PO TABS: 650.0000 mg | ORAL_TABLET | Freq: Four times a day (QID) | ORAL | Status: DC | PRN

## 2021-07-27 MED ORDER — BISACODYL 5 MG PO TBEC: 5.0000 mg | DELAYED_RELEASE_TABLET | Freq: Every day | ORAL | Status: DC | PRN

## 2021-07-27 MED ORDER — ALBUTEROL SULFATE HFA 108 (90 BASE) MCG/ACT IN AERS
2.0000 | INHALATION_SPRAY | RESPIRATORY_TRACT | Status: DC | PRN
  Filled 2021-07-27: qty 6.7

## 2021-07-27 MED ORDER — LACTATED RINGERS IV BOLUS
1000.0000 mL | Freq: Once | INTRAVENOUS | Status: AC
  Administered 2021-07-27: 1000 mL via INTRAVENOUS

## 2021-07-27 MED ORDER — SODIUM CHLORIDE 0.9% FLUSH
3.0000 mL | Freq: Two times a day (BID) | INTRAVENOUS | Status: DC
  Administered 2021-07-27 – 2021-07-28 (×2): 3 mL via INTRAVENOUS

## 2021-07-27 MED ORDER — LACTATED RINGERS IV BOLUS
1000.0000 mL | INTRAVENOUS | Status: AC
  Administered 2021-07-27 (×2): 1000 mL via INTRAVENOUS

## 2021-07-27 MED ORDER — DEXTROSE 50 % IV SOLN: 0.0000 mL | INTRAVENOUS | Status: DC | PRN

## 2021-07-27 MED ORDER — INSULIN REGULAR(HUMAN) IN NACL 100-0.9 UT/100ML-% IV SOLN
INTRAVENOUS | Status: DC
  Administered 2021-07-27: 0.3 [IU]/h via INTRAVENOUS
  Administered 2021-07-27: 1 [IU]/h via INTRAVENOUS
  Administered 2021-07-27: 1.9 [IU]/h via INTRAVENOUS
  Administered 2021-07-27: 2.2 [IU]/h via INTRAVENOUS
  Filled 2021-07-27: qty 100

## 2021-07-27 MED ORDER — ESCITALOPRAM OXALATE 20 MG PO TABS
20.0000 mg | ORAL_TABLET | Freq: Every day | ORAL | Status: DC
  Administered 2021-07-27 – 2021-07-29 (×3): 20 mg via ORAL
  Filled 2021-07-27 (×2): qty 2
  Filled 2021-07-27: qty 1

## 2021-07-27 MED ORDER — DOCUSATE SODIUM 100 MG PO CAPS
100.0000 mg | ORAL_CAPSULE | Freq: Two times a day (BID) | ORAL | Status: DC
  Administered 2021-07-27 – 2021-07-28 (×4): 100 mg via ORAL
  Filled 2021-07-27 (×5): qty 1

## 2021-07-27 MED ORDER — ONDANSETRON HCL 4 MG PO TABS: 4.0000 mg | ORAL_TABLET | Freq: Four times a day (QID) | ORAL | Status: DC | PRN

## 2021-07-27 MED ORDER — GUAIFENESIN-DM 100-10 MG/5ML PO SYRP: 10.0000 mL | ORAL_SOLUTION | ORAL | Status: DC | PRN

## 2021-07-27 MED ORDER — SODIUM CHLORIDE 0.9% FLUSH: 3.0000 mL | INTRAVENOUS | Status: DC | PRN

## 2021-07-27 MED ORDER — SODIUM CHLORIDE 0.9% FLUSH
3.0000 mL | Freq: Two times a day (BID) | INTRAVENOUS | Status: DC
  Administered 2021-07-27: 3 mL via INTRAVENOUS

## 2021-07-27 MED ORDER — POLYETHYLENE GLYCOL 3350 17 G PO PACK: 17.0000 g | PACK | Freq: Every day | ORAL | Status: DC | PRN

## 2021-07-27 MED ORDER — FLEET ENEMA 7-19 GM/118ML RE ENEM
1.0000 | ENEMA | Freq: Once | RECTAL | Status: DC | PRN
  Filled 2021-07-27: qty 1

## 2021-07-27 MED ORDER — LACTATED RINGERS IV BOLUS
2000.0000 mL | Freq: Once | INTRAVENOUS | Status: AC
  Administered 2021-07-27: 2000 mL via INTRAVENOUS

## 2021-07-27 MED ORDER — HYDROXYZINE HCL 10 MG PO TABS: 10.0000 mg | ORAL_TABLET | Freq: Three times a day (TID) | ORAL | Status: DC | PRN

## 2021-07-27 MED ORDER — SODIUM CHLORIDE 0.9 % IV SOLN
12.5000 mg | Freq: Four times a day (QID) | INTRAVENOUS | Status: DC | PRN
  Filled 2021-07-27: qty 0.5

## 2021-07-27 MED ORDER — POTASSIUM CHLORIDE 10 MEQ/100ML IV SOLN
10.0000 meq | INTRAVENOUS | Status: AC
  Administered 2021-07-27 (×2): 10 meq via INTRAVENOUS
  Filled 2021-07-27 (×2): qty 100

## 2021-07-27 MED ORDER — ASCORBIC ACID 500 MG PO TABS
500.0000 mg | ORAL_TABLET | Freq: Every day | ORAL | Status: DC
  Administered 2021-07-27 – 2021-07-29 (×3): 500 mg via ORAL
  Filled 2021-07-27 (×3): qty 1

## 2021-07-27 MED ORDER — SODIUM CHLORIDE 0.9 % IV SOLN: 250.0000 mL | INTRAVENOUS | Status: DC | PRN

## 2021-07-27 MED ORDER — ZINC SULFATE 220 (50 ZN) MG PO CAPS
220.0000 mg | ORAL_CAPSULE | Freq: Every day | ORAL | Status: DC
  Administered 2021-07-27 – 2021-07-29 (×3): 220 mg via ORAL
  Filled 2021-07-27 (×3): qty 1

## 2021-07-27 MED ORDER — HYDROCOD POLST-CPM POLST ER 10-8 MG/5ML PO SUER: 5.0000 mL | Freq: Two times a day (BID) | ORAL | Status: DC | PRN

## 2021-07-27 MED ORDER — NIRMATRELVIR/RITONAVIR (PAXLOVID)TABLET: 3.0000 | ORAL_TABLET | Freq: Two times a day (BID) | ORAL | Status: DC

## 2021-07-27 MED ORDER — NORGESTIMATE-ETH ESTRADIOL 0.25-35 MG-MCG PO TABS: 1.0000 | ORAL_TABLET | Freq: Every evening | ORAL | Status: DC

## 2021-07-27 MED ORDER — BUPROPION HCL ER (XL) 150 MG PO TB24
300.0000 mg | ORAL_TABLET | Freq: Every day | ORAL | Status: DC
  Administered 2021-07-27 – 2021-07-29 (×3): 300 mg via ORAL
  Filled 2021-07-27: qty 1
  Filled 2021-07-27: qty 2
  Filled 2021-07-27: qty 1

## 2021-07-27 MED ORDER — ONDANSETRON HCL 4 MG/2ML IJ SOLN
4.0000 mg | Freq: Four times a day (QID) | INTRAMUSCULAR | Status: DC | PRN
  Administered 2021-07-28: 11:00:00 4 mg via INTRAVENOUS
  Filled 2021-07-27: qty 2

## 2021-07-27 MED ORDER — OXYCODONE HCL 5 MG PO TABS
5.0000 mg | ORAL_TABLET | ORAL | Status: DC | PRN
  Filled 2021-07-27: qty 1

## 2021-07-27 MED ORDER — DEXTROSE IN LACTATED RINGERS 5 % IV SOLN: INTRAVENOUS | Status: DC

## 2021-07-27 NOTE — ED Triage Notes (Addendum)
Patient here with nausea and vomiting.  She states she has the flu and that she has Type I diabetes, dumping ketones in her urine.  Patient's lips are chapped and peely.

## 2021-07-27 NOTE — H&P (Addendum)
History and Physical    Kelli Davis NLZ:[id number] DOB: 07/26/2001 DOA: 07/27/2021  PCP: [name], FNP Consultants:  Fransico Michael - endocrinology Patient coming from: Home - lives with friends, moved out of her mom's house a week ago; [facility]: Mother, [phone]  Chief Complaint: DKA  HPI: Kelli Davis is a 20 y.o. female with medical history significant of ADD and type 1 DM presenting with emesis  She was previously hospitalized for DKA last year and was found to have influenza A at that time.  She was started on Tamiflu, improved, and was discharged.   She went back to her apartment when she felt better but came back home again yesterday, miserable again.  She has been vomiting and coughing up phlegm - but her mother wasn't sure whether this was related to flu or DKA.  She decided not to wait as long this time and so brought her to the ER today.    ED Course: Back in DKA again.  Emesis likely continued flu + DKA.  Very dry.  Has a good IV, given 2L, on Endotool.  Flu and COVID positive.  Review of Systems: As per HPI; otherwise review of systems reviewed and negative.   Ambulatory Status:  Ambulates without assistance  COVID Vaccine Status:  Complete  Past Medical History:  Diagnosis Date   ADD (attention deficit disorder)    Eating disorder    Febrile seizure (HCC)    History of eye surgery    Physical growth delay    Type 1 diabetes mellitus not at goal [facility])     Past Surgical History:  Procedure Laterality Date   EYE MUSCLE SURGERY      Social History   Socioeconomic History   Marital status: Single    Spouse name: Not on file   Number of children: Not on file   Years of education: Not on file   Highest education level: Not on file  Occupational History   Occupation: waitress/bartender  Tobacco Use   Smoking status: Never    Passive exposure: Yes   Smokeless tobacco: Never   Tobacco comments:    Pt reports only once  Substance and Sexual Activity    Alcohol use: Yes    Comment: Pt reports only once or twice   Drug use: No   Sexual activity: Yes    Birth control/protection: Condom    Comment: Parents are not aware pt. has been sexually active with BF.  Other Topics Concern   Not on file  Social History Narrative   The child's parents are divorced. Father has remarried. Stepmother has type 1 diabetes mellitus and is on an insulin pump.Spends time in both her mother and father's homes. Parents very cooperative about diabetes care.    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 Strain: Not on [employer] Insecurity: Not on file  Transportation Needs: Not on file  Physical Activity: Not on file  Stress: Not on file  Social Connections: Not on file  Intimate Partner Violence: Not on file    Allergies  Allergen Reactions   Penicillins Hives    Family History  Problem Relation Age of Onset   Cancer Maternal Grandmother    Hypertension Maternal Grandfather    Depression Maternal Grandfather    Diabetes Paternal Grandfather    Anxiety disorder Father    Depression Mother    Anxiety disorder Sister     Prior to Admission medications  Medication Sig Start Date End Date Taking? Authorizing Provider  ACCU-CHEK FASTCLIX LANCETS MISC Check sugar 10 x daily 10/21/15  Yes Hermenia Bers, NP  buPROPion (WELLBUTRIN XL) 300 MG 24 hr tablet TAKE 1 TABLET BY MOUTH EVERY DAY Patient taking differently: 300 mg daily. 05/26/21  Yes [name], FNP  escitalopram (LEXAPRO) 20 MG tablet Take 1 tablet (20 mg total) by mouth daily. 07/15/21 07/15/22 Yes [name], FNP  fluticasone (FLONASE) 50 MCG/ACT nasal spray 1 spray daily as needed for allergies. 02/06/19  Yes [provider]  Glucagon (BAQSIMI ONE PACK) 3 MG/DOSE POWD Place 1 Dose into the nose as needed. Patient taking differently: Place 1 Dose into the nose as needed (low blood sugar). 10/07/19  Yes Hermenia Bers, NP  glucose  blood test strip Use as instructed 05/13/21  Yes Hermenia Bers, NP  hydrOXYzine (ATARAX/VISTARIL) 10 MG tablet Take 1 tablet (10 mg total) by mouth 3 (three) times daily as needed. Patient taking differently: Take 10 mg by mouth 3 (three) times daily as needed for anxiety. 05/11/21  Yes [name], FNP  ibuprofen (ADVIL,MOTRIN) 200 MG tablet Take 400 mg by mouth every 6 (six) hours as needed for headache, mild pain or cramping.   Yes [provider]  insulin aspart (NOVOLOG FLEXPEN) 100 UNIT/ML FlexPen INJECT UP TO 50 UNITS SUBCUTANEOUSLY DAILY Patient taking differently: 0-50 Units 3 (three) times daily with meals. Per sliding scale 02/18/21  Yes Hermenia Bers, NP  insulin degludec (TRESIBA FLEXTOUCH) 100 UNIT/ML FlexTouch Pen Inject 30 Units into the skin at bedtime. Inject up to 50 units per day SubQ Patient taking differently: Inject 30 Units into the skin at bedtime. 07/22/21  Yes Ghimire, Henreitta Leber, MD  Insulin Pen Needle (BD PEN NEEDLE NANO U/F) 32G X 4 MM MISC Use with insulin pens 6x daily 10/22/20  Yes Hermenia Bers, NP  norgestimate-ethinyl estradiol ([place] 28) 0.25-35 MG-MCG tablet TAKE 1 TABLET DAILY. DISCARD PLACEBOS AND TAKE ACTIVE PILLS FOR CONTINUOUS CYCLING Patient taking differently: Take 1 tablet by mouth every evening. 05/04/20  Yes [name], FNP  ondansetron (ZOFRAN) 8 MG tablet Take 1 tablet (8 mg total) by mouth every 8 (eight) hours as needed for nausea or vomiting. 07/20/21  Yes [name], FNP  prazosin (MINIPRESS) 2 MG capsule Take 1 capsule (2 mg total) by mouth at bedtime. 05/18/21  Yes [name], FNP  Continuous Blood Gluc Sensor (DEXCOM G6 SENSOR) MISC 1 APPLICATION BY DOES NOT APPLY ROUTE AS NEEDED. 01/05/21   Hermenia Bers, NP  Continuous Blood Gluc Transmit (DEXCOM G6 TRANSMITTER) MISC 1 application by Does not apply route continuous as needed. 07/23/20   Hermenia Bers, NP  oseltamivir (TAMIFLU) 75 MG capsule Take 1  capsule (75 mg total) by mouth 2 (two) times daily. Patient not taking: Reported on 07/27/2021 07/22/21   Jonetta Osgood, MD    Physical Exam: Vitals:   07/27/21 0545 07/27/21 0620  BP: (!) 145/91 120/89  Pulse: (!) 133 99  Resp: 20 18  Temp: 97.7 F (36.5 C)   TempSrc: Oral   SpO2: 100% 100%     General:  Appears ill but alert and able to interact Eyes:  PERRL, EOMI, normal lids, iris ENT:  grossly normal hearing, lips & tongue, dry mm; appropriate dentition Neck:  no LAD, masses or thyromegaly Cardiovascular:  RR with tachycardia, no m/r/g. No LE edema.  Respiratory:   CTA bilaterally with no wheezes/rales/rhonchi.  Normal respiratory effort.  Abdomen:  soft, generalized TTP but worse in upper abdomen and mid-epigastric region, ND Skin:  no rash or induration seen on limited exam Musculoskeletal:  grossly normal tone BUE/BLE, good ROM, no bony abnormality Psychiatric:  blunted mood and affect, speech fluent and appropriate, AOx3 Neurologic:  CN 2-12 grossly intact, moves all extremities in coordinated fashion    Radiological Exams on Admission: Independently reviewed - see discussion in A/P where applicable  No results found.  EKG: Independently reviewed.  Sinus tachycardia with rate 107; nonspecific ST changes that are likely rate related   Labs on Admission: I have personally reviewed the available labs and imaging studies at the time of the admission.  Pertinent labs:   VBG: 7.107/24.2/7.7 Na++ 132 Glucose 475 BUN 11/Creatinine 1.61/GFR 46; 5/0.67/>60 on 12/8 Anion gap 25 Unremarkable CBC COVID POSITIVE FLU A POSITIVE UA: >500 glucose, >80 ketones, rare bacteria   Assessment/Plan Principal Problem:   DKA (diabetic ketoacidosis) (HCC) Active Problems:   Adjustment disorder with mixed anxiety and depressed mood   Influenza A   COVID-19 virus infection   AKI (acute kidney injury) ([place])   DKA -Patient with poor baseline control (last A1c was 13.1 on  12/8 -She moved out of her mom's house and in with friends and the next day presented with DKA associated with influenza A -She improved somewhat but returned to her mom's house last night with recurrent symptoms of vomiting, fatigue, cough with phlegm -Her mother attempted to get it under control with her protocol but without success -Moderate to severe DKA on admission based on pH, HCO3 <10,  anion gap, patient alert but drowsy -Will admit to SDU with DKA protocol -Would recommend continuing insulin drip at least until morning regardless of rapidity of closure of gap and normalization of labs -She is primarily dehydrated and needs aggressive IV hydration -K+ slightly low and so potassium supplementation added -IVF at 150 cc/hr, LR until glucose <250 and then decrease rate to 125 and change to D5LR -Diabetes coordinator again consulted  AKI -Associated with DM -Anticipated to improve with aggressive IVF hydration and treatment of DKA  COVID-19 infection -Patient with presenting with some cough with phlegm but this may be related to DKA and n/v -COVID POSITIVE -The patient has comorbidities which may increase the risk for ARDS/MODS including: DM -Pertinent labs concerning for COVID include normal WBC count; increased BUN/Creatinine; remaining COVID labs are pending -CXR pending -At this time, will attempt to avoid use of aerosolized medications and use HFAs instead -Will check daily labs including BMP with Mag, Phos; LFTs; CBC with differential; CRP; ferritin; fibrinogen; D-dimer -Will hold steroids given DKA -Pharmacy consulted for consideration of Paxlovid given her immunocompromise; her cycle threshold is 40 and so she does not need treatment for COVID at this time -Encourage mobilization/ambulation as much as possible -Patient was seen wearing full PPE including: gown, gloves, N95, and face shield; donning and doffing was in compliance with current standards.  Influenza A -Recently  treated with Tamiflu and has completed therapy  Mood d/o, eating d/o -Continue Welbutrin, Lexapro, hydroxyzine, prazosin -Needs close outpatient psych f/u    Level of care: Progressive DVT prophylaxis:  Lovenox  Code Status:  Full - confirmed with patient Family Communication: Mother was present throughout evaluation Disposition Plan:  The patient is from: home  Anticipated d/c is to: home without [facility] services   Anticipated d/c date will depend on clinical response to treatment, likely 2-3 days  Patient is currently: acutely ill Consults called:  Diabetes coordinator; pharmacy Admission status: Admit - It is my clinical opinion that admission to INPATIENT is reasonable and necessary because of the expectation that this patient will require hospital care that crosses at least 2 midnights to treat this condition based on the medical complexity of the problems presented.  Given the aforementioned information, the predictability of an adverse outcome is felt to be significant.       Karmen Bongo MD [facility]   How to contact the [facility] Attending or Consulting provider [place] or covering provider during after hours [place], for this patient?  Check the care team in [facility] and look for a) attending/consulting TR[facility] provider listed and b) the [facility] team listed Log into [email or web address] and use [place]'s universal password to access. If you do not have the password, please contact the hospital operator. Locate the [facility] provider you are looking for under [facility] and page to a number that you can be directly reached. If you still have difficulty reaching the provider, please page the [facility] (Director on Call) for the Hospitalists listed on amion for assistance.   07/27/2021, 8:52 AM

## 2021-07-27 NOTE — Progress Notes (Signed)
Inpatient Diabetes Program Recommendations  AACE/ADA: New Consensus Statement on Inpatient Glycemic Control (2015)  Target Ranges:  Prepandial:   less than 140 mg/dL      Peak postprandial:   less than 180 mg/dL (1-2 hours)      Critically ill patients:  140 - 180 mg/dL   Lab Results  Component Value Date   GLUCAP 221 (H) 07/27/2021   HGBA1C 13.1 (H) 07/22/2021    Review of Glycemic Control  Latest Reference Range & Units 07/27/21 05:49 07/27/21 06:37  Glucose 70 - 99 mg/dL 811 (H) 572 (H)   Diabetes history: DM 1 Outpatient Diabetes medications: Tresiba 30 units QHS, Novolog SSI, Novolog 1 unit/5 CHO with meals   Inpatient Diabetes Program Recommendations:    Note patient being readmitted for DKA.  Patient was seen by diabetes coordinator on 07/21/21.  She does wear a Dexcom sensor and uses basal/bolus insulin.  Would not recommend transition off insulin drip until acidosis cleared and patient able to eat.    When patient is ready for transition, consider Semglee 25 units daily, Novolog sensitive correction and Novolog 1 units/5  grams of CHO.    Will follow.   Thanks,  Beryl Meager, RN, BC-ADM Inpatient Diabetes Coordinator Pager [phone]  (8a-5p)

## 2021-07-27 NOTE — ED Provider Notes (Signed)
[facility] EMERGENCY DEPARTMENT Provider Note   CSN: [id number] Arrival date & time: 07/27/21  7096     History Chief Complaint  Patient presents with   Emesis    Kelli Davis is a 20 y.o. female.  HPI     This is a 20 year old female with a history of type 1 diabetes and recent history of influenza with DKA who presents with ongoing emesis.  Patient was diagnosed with influenza A on 12/7.  She had a brief admission to the hospital for 2 days for DKA.  She was sent home with Zofran.  She reports ongoing emesis.  She thought she had gotten a little bit better but over the last 24 hours has not been able to keep any fluids down.  Mother reports that she has been taking her insulin; however, they noted that she was spilling ketones in her urine.  Denies shortness of breath or fevers.  Emesis is described as nonbilious, nonbloody.  Reports generalized abdominal discomfort.  No localizing symptoms.  Patient finished Tamiflu yesterday.  Past Medical History:  Diagnosis Date   ADD (attention deficit disorder)    Eating disorder    Febrile seizure (HCC)    History of eye surgery    Hypoglycemia associated with diabetes (HCC)    Physical growth delay    Type 1 diabetes mellitus not at goal [facility])     Patient Active Problem List   Diagnosis Date Noted   DKA (diabetic ketoacidosis) (HCC) 07/21/2021   Current moderate episode of major depressive disorder without prior episode (HCC) 05/25/2021   Vitamin D deficiency 05/05/2020   Insulin dose changed (HCC) 09/03/2018   Mixed hyperlipidemia 09/03/2018   Generalized anxiety disorder 09/04/2017   Oral contraceptive pill surveillance 09/04/2017   Eating disorder 05/16/2016   Insomnia 04/12/2016   Adjustment disorder with mixed anxiety and depressed mood 10/08/2015   ADHD (attention deficit hyperactivity disorder), combined type 05/20/2015   Maladaptive health behaviors affecting medical condition 10/17/2013    Hyperglycemia 07/11/2011   Hypoglycemia associated with diabetes (HCC)    Uncontrolled type 1 diabetes mellitus with hyperglycemia (HCC) 12/13/2010    Past Surgical History:  Procedure Laterality Date   EYE MUSCLE SURGERY       OB History   No obstetric history on file.     Family History  Problem Relation Age of Onset   Cancer Maternal Grandmother    Hypertension Maternal Grandfather    Depression Maternal Grandfather    Diabetes Paternal Grandfather    Anxiety disorder Father    Depression Mother    Anxiety disorder Sister     Social History   Tobacco Use   Smoking status: Never    Passive exposure: Yes   Smokeless tobacco: Never   Tobacco comments:    Pt reports only once  Substance Use Topics   Alcohol use: Yes    Comment: Pt reports only once or twice   Drug use: No    Home Medications Prior to Admission medications   Medication Sig Start Date End Date Taking? Authorizing Provider  ACCU-CHEK FASTCLIX LANCETS MISC Check sugar 10 x daily 10/21/15  Yes Gretchen Short, NP  buPROPion (WELLBUTRIN XL) 300 MG 24 hr tablet TAKE 1 TABLET BY MOUTH EVERY DAY Patient taking differently: 300 mg daily. 05/26/21  Yes [name], FNP  escitalopram (LEXAPRO) 20 MG tablet Take 1 tablet (20 mg total) by mouth daily. 07/15/21 07/15/22 Yes [name], FNP  fluticasone (FLONASE) 50  MCG/ACT nasal spray 1 spray daily as needed for allergies. 02/06/19  Yes [provider]  Glucagon (BAQSIMI ONE PACK) 3 MG/DOSE POWD Place 1 Dose into the nose as needed. Patient taking differently: Place 1 Dose into the nose as needed (low blood sugar). 10/07/19  Yes Gretchen Short, NP  glucose blood test strip Use as instructed 05/13/21  Yes Gretchen Short, NP  hydrOXYzine (ATARAX/VISTARIL) 10 MG tablet Take 1 tablet (10 mg total) by mouth 3 (three) times daily as needed. Patient taking differently: Take 10 mg by mouth 3 (three) times daily as needed for anxiety. 05/11/21  Yes  [name], FNP  ibuprofen (ADVIL,MOTRIN) 200 MG tablet Take 400 mg by mouth every 6 (six) hours as needed for headache, mild pain or cramping.   Yes [provider]  insulin aspart (NOVOLOG FLEXPEN) 100 UNIT/ML FlexPen INJECT UP TO 50 UNITS SUBCUTANEOUSLY DAILY Patient taking differently: 0-50 Units 3 (three) times daily with meals. Per sliding scale 02/18/21  Yes Gretchen Short, NP  insulin degludec (TRESIBA FLEXTOUCH) 100 UNIT/ML FlexTouch Pen Inject 30 Units into the skin at bedtime. Inject up to 50 units per day SubQ Patient taking differently: Inject 30 Units into the skin at bedtime. 07/22/21  Yes Ghimire, Werner Lean, MD  Insulin Pen Needle (BD PEN NEEDLE NANO U/F) 32G X 4 MM MISC Use with insulin pens 6x daily 10/22/20  Yes Gretchen Short, NP  norgestimate-ethinyl estradiol (SPRINTEC 28) 0.25-35 MG-MCG tablet TAKE 1 TABLET DAILY. DISCARD PLACEBOS AND TAKE ACTIVE PILLS FOR CONTINUOUS CYCLING Patient taking differently: Take 1 tablet by mouth every evening. 05/04/20  Yes [name], FNP  ondansetron (ZOFRAN) 8 MG tablet Take 1 tablet (8 mg total) by mouth every 8 (eight) hours as needed for nausea or vomiting. 07/20/21  Yes [name], FNP  prazosin (MINIPRESS) 2 MG capsule Take 1 capsule (2 mg total) by mouth at bedtime. 05/18/21  Yes [name], FNP  Continuous Blood Gluc Sensor (DEXCOM G6 SENSOR) MISC 1 APPLICATION BY DOES NOT APPLY ROUTE AS NEEDED. 01/05/21   Gretchen Short, NP  Continuous Blood Gluc Transmit (DEXCOM G6 TRANSMITTER) MISC 1 application by Does not apply route continuous as needed. 07/23/20   Gretchen Short, NP  oseltamivir (TAMIFLU) 75 MG capsule Take 1 capsule (75 mg total) by mouth 2 (two) times daily. Patient not taking: Reported on 07/27/2021 07/22/21   Maretta Bees, MD    Allergies    Penicillins  Review of Systems   Review of Systems  Constitutional:  Negative for fever.  Respiratory:  Negative for shortness of breath.    Cardiovascular:  Negative for chest pain.  Gastrointestinal:  Positive for abdominal pain, nausea and vomiting. Negative for constipation and diarrhea.  Endocrine: Positive for polyuria.  Genitourinary:  Negative for dysuria.  All other systems reviewed and are negative.  Physical Exam Updated Vital Signs BP 120/89   Pulse 99   Temp 97.7 F (36.5 C) (Oral)   Resp 18   SpO2 100%   Physical Exam Vitals and nursing note reviewed.  Constitutional:      Appearance: She is well-developed.     Comments: Thin  HENT:     Head: Normocephalic and atraumatic.     Nose: Nose normal.     Mouth/Throat:     Mouth: Mucous membranes are dry.     Comments: Dry chapped lips Eyes:     Pupils: Pupils are equal, round, and reactive to light.  Cardiovascular:  Rate and Rhythm: Regular rhythm. Tachycardia present.     Heart sounds: Normal heart sounds.  Pulmonary:     Effort: Pulmonary effort is normal. No respiratory distress.     Breath sounds: No wheezing.     Comments: Mild tachypnea Abdominal:     General: Bowel sounds are normal.     Palpations: Abdomen is soft.     Tenderness: There is no abdominal tenderness. There is no guarding or rebound.  Musculoskeletal:     Cervical back: Neck supple.     Right lower leg: No edema.     Left lower leg: No edema.  Skin:    General: Skin is warm and dry.  Neurological:     Mental Status: She is alert and oriented to person, place, and time.  Psychiatric:        Mood and Affect: Mood normal.    ED Results / Procedures / Treatments   Labs (all labs ordered are listed, but only abnormal results are displayed) Labs Reviewed  CBC WITH DIFFERENTIAL/PLATELET - Abnormal; Notable for the following components:      Result Value   RBC 5.39 (*)    Hemoglobin 17.0 (*)    HCT 51.6 (*)    All other components within normal limits  URINALYSIS, ROUTINE W REFLEX MICROSCOPIC - Abnormal; Notable for the following components:   Specific Gravity,  Urine >1.030 (*)    Glucose, UA >=500 (*)    Bilirubin Urine SMALL (*)    Ketones, ur >80 (*)    All other components within normal limits  URINALYSIS, MICROSCOPIC (REFLEX) - Abnormal; Notable for the following components:   Bacteria, UA RARE (*)    All other components within normal limits  I-STAT CHEM 8, ED - Abnormal; Notable for the following components:   Sodium 132 (*)    Glucose, Bld 463 (*)    Calcium, Ion 1.14 (*)    TCO2 10 (*)    Hemoglobin 17.3 (*)    HCT 51.0 (*)    All other components within normal limits  CBG MONITORING, ED - Abnormal; Notable for the following components:   Glucose-Capillary 481 (*)    All other components within normal limits  RESP PANEL BY RT-PCR (FLU A&B, COVID) ARPGX2  COMPREHENSIVE METABOLIC PANEL  LIPASE, BLOOD  BETA-HYDROXYBUTYRIC ACID  I-STAT VENOUS BLOOD GAS, ED  I-STAT BETA HCG BLOOD, ED ([facility], [facility], [facility] ONLY)  CBG MONITORING, ED    EKG EKG Interpretation  Date/Time:  Tuesday July 27 2021 06:01:30 EST Ventricular Rate:  107 PR Interval:  134 QRS Duration: 78 QT Interval:  360 QTC Calculation: 480 R Axis:   96 Text Interpretation: Sinus tachycardia Right atrial enlargement Rightward axis Pulmonary disease pattern Abnormal ECG Confirmed by Ross Marcus ([id number]) on 07/27/2021 6:39:45 AM  Radiology No results found.  Procedures .Critical Care Performed by: Shon Baton, MD Authorized by: Shon Baton, MD   Critical care provider statement:    Critical care time (minutes):  60   Critical care was necessary to treat or prevent imminent or life-threatening deterioration of the following conditions:  Metabolic crisis   Critical care was time spent personally by me on the following activities:  Development of treatment plan with patient or surrogate, discussions with consultants, evaluation of patient's response to treatment, examination of patient, ordering and review of laboratory studies, ordering and review of  radiographic studies, ordering and performing treatments and interventions, pulse oximetry, re-evaluation of patient's condition and review of old  charts Ultrasound ED Peripheral IV (Provider)  Date/Time: 07/27/2021 6:34 AM Performed by: Shon Baton, MD Authorized by: Shon Baton, MD   Procedure details:    Indications: hydration, multiple failed IV attempts and poor IV access     Skin Prep: chlorhexidine gluconate     Location: Right basilic.   Angiocath:  20 G   Bedside Ultrasound Guided: Yes     Images: not archived     Patient tolerated procedure without complications: Yes     Dressing applied: Yes     Medications Ordered in ED Medications  lactated ringers bolus 1,000 mL (has no administration in time range)  dextrose 5 % in lactated ringers infusion (has no administration in time range)  dextrose 50 % solution 0-50 mL (has no administration in time range)  potassium chloride 10 mEq in 100 mL IVPB (has no administration in time range)  lactated ringers infusion (has no administration in time range)  insulin regular, human (MYXREDLIN) 100 units/ 100 mL infusion (has no administration in time range)  lactated ringers bolus 1,000 mL (1,000 mLs Intravenous New Bag/Given 07/27/21 0631)    ED Course  I have reviewed the triage vital signs and the nursing notes.  Pertinent labs & imaging results that were available during my care of the patient were reviewed by me and considered in my medical decision making (see chart for details).    MDM Rules/Calculators/A&P                           Patient presents with ongoing emesis.  Recently influenza positive with diabetic ketoacidosis.  Clinically she is ill-appearing but nontoxic.  She is dry.  Vital signs notable for tachycardia into the 130s.  She does not appear to be severely tachypneic.  I started an IV as patient has poor peripheral access.  She was initiated on 2 L of LR.  Initial blood work indicates a bicarb of  less than 10 and a sugar of 481.  Suspect DKA.  Likely related to dehydration and ongoing influenza symptoms.  Venous blood gas with a pH of 7.108, bicarbonate of 7.7, sodium 132.  Patient appears hemoconcentrated on her CBC.  Patient was initiated on Endo tool.  EKG shows sinus tachycardia.  No evidence of arrhythmia or ischemia.  Patient was given Phenergan for her nausea.  We will plan for admission for DKA likely related to ongoing symptoms related to influenza.  Repeat COVID testing was sent.   Final Clinical Impression(s) / ED Diagnoses Final diagnoses:  Influenza A  Diabetic ketoacidosis without coma associated with type 1 diabetes mellitus (HCC)  Nausea and vomiting, unspecified vomiting type    Rx / DC Orders ED Discharge Orders     None        Shon Baton, MD 07/27/21 [phone]

## 2021-07-27 NOTE — ED Notes (Signed)
Notified MD of stable CBG and closed gap. To continue with current plan

## 2021-07-27 NOTE — ED Provider Notes (Signed)
Emergency Medicine Provider Triage Evaluation Note  Kelli Davis , a 20 y.o. female  was evaluated in triage.  Pt complains of vomiting.  Type I diabetic.  Diagnosed with influenza 2 days ago.  Has had multiple episodes NBNB emesis.  Unable to keep down p.o. intake.  Has had some generalized abdominal pain since she started vomiting.  States she has been spilling ketones in her urine according to her strips at home.  Blood sugars have been in the high 300s.  Feels like her prior episodes of DKA.  Review of Systems  Positive: Emesis, abd pain Negative: fever  Physical Exam  BP (!) 145/91 (BP Location: Left Arm)   Pulse (!) 133   Temp 97.7 F (36.5 C) (Oral)   Resp 20   SpO2 100%  Gen:   Awake, no distress  Mouth:  Very dry mucous membranes Resp:  Normal effort  MSK:   Moves extremities without difficulty  Other:    Medical Decision Making  Medically screening exam initiated at 5:45 AM.  Appropriate orders placed.  Kelli Davis was informed that the remainder of the evaluation will be completed by another provider, this initial triage assessment does not replace that evaluation, and the importance of remaining in the ED until their evaluation is complete.  emesis   Kelli Davis A, PA-C 07/27/21 0545    Dione Booze, MD 07/27/21 [phone]

## 2021-07-28 LAB — CBC WITH DIFFERENTIAL/PLATELET
Abs Immature Granulocytes: 0.02 10*3/uL (ref 0.00–0.07)
Basophils Absolute: 0 10*3/uL (ref 0.0–0.1)
Basophils Relative: 0 %
Eosinophils Absolute: 0 10*3/uL (ref 0.0–0.5)
Eosinophils Relative: 0 %
HCT: 35.6 % — ABNORMAL LOW (ref 36.0–46.0)
Hemoglobin: 12.9 g/dL (ref 12.0–15.0)
Immature Granulocytes: 1 %
Lymphocytes Relative: 41 %
Lymphs Abs: 1.7 10*3/uL (ref 0.7–4.0)
MCH: 32.2 pg (ref 26.0–34.0)
MCHC: 36.2 g/dL — ABNORMAL HIGH (ref 30.0–36.0)
MCV: 88.8 fL (ref 80.0–100.0)
Monocytes Absolute: 0.3 10*3/uL (ref 0.1–1.0)
Monocytes Relative: 6 %
Neutro Abs: 2.2 10*3/uL (ref 1.7–7.7)
Neutrophils Relative %: 52 %
Platelets: 195 10*3/uL (ref 150–400)
RBC: 4.01 MIL/uL (ref 3.87–5.11)
RDW: 12.4 % (ref 11.5–15.5)
WBC: 4.3 10*3/uL (ref 4.0–10.5)
nRBC: 0 % (ref 0.0–0.2)

## 2021-07-28 LAB — BASIC METABOLIC PANEL
Anion gap: 8 (ref 5–15)
Anion gap: 9 (ref 5–15)
BUN: <5 mg/dL — ABNORMAL LOW (ref 6–20)
BUN: <5 mg/dL — ABNORMAL LOW (ref 6–20)
CO2: 20 mmol/L — ABNORMAL LOW (ref 22–32)
CO2: 22 mmol/L (ref 22–32)
Calcium: 7.5 mg/dL — ABNORMAL LOW (ref 8.9–10.3)
Calcium: 7.6 mg/dL — ABNORMAL LOW (ref 8.9–10.3)
Chloride: 104 mmol/L (ref 98–111)
Chloride: 105 mmol/L (ref 98–111)
Creatinine, Ser: 0.76 mg/dL (ref 0.44–1.00)
Creatinine, Ser: 0.78 mg/dL (ref 0.44–1.00)
GFR, Estimated: >60 mL/min (ref >60)
GFR, Estimated: >60 mL/min (ref >60)
Glucose, Bld: 197 mg/dL — ABNORMAL HIGH (ref 70–99)
Glucose, Bld: 261 mg/dL — ABNORMAL HIGH (ref 70–99)
Potassium: 2.7 mmol/L — CL (ref 3.5–5.1)
Potassium: 2.8 mmol/L — ABNORMAL LOW (ref 3.5–5.1)
Sodium: 133 mmol/L — ABNORMAL LOW (ref 135–145)
Sodium: 135 mmol/L (ref 135–145)

## 2021-07-28 LAB — CBG MONITORING, ED
Glucose-Capillary: 154 mg/dL — ABNORMAL HIGH (ref 70–99)
Glucose-Capillary: 158 mg/dL — ABNORMAL HIGH (ref 70–99)
Glucose-Capillary: 176 mg/dL — ABNORMAL HIGH (ref 70–99)
Glucose-Capillary: 178 mg/dL — ABNORMAL HIGH (ref 70–99)
Glucose-Capillary: 186 mg/dL — ABNORMAL HIGH (ref 70–99)
Glucose-Capillary: 206 mg/dL — ABNORMAL HIGH (ref 70–99)
Glucose-Capillary: 229 mg/dL — ABNORMAL HIGH (ref 70–99)
Glucose-Capillary: 241 mg/dL — ABNORMAL HIGH (ref 70–99)

## 2021-07-28 LAB — C-REACTIVE PROTEIN: CRP: <0.5 mg/dL (ref <1.0)

## 2021-07-28 LAB — GLUCOSE, CAPILLARY
Glucose-Capillary: 175 mg/dL — ABNORMAL HIGH (ref 70–99)
Glucose-Capillary: 170 mg/dL — ABNORMAL HIGH (ref 70–99)

## 2021-07-28 LAB — PHOSPHORUS: Phosphorus: 2.2 mg/dL — ABNORMAL LOW (ref 2.5–4.6)

## 2021-07-28 LAB — BETA-HYDROXYBUTYRIC ACID: Beta-Hydroxybutyric Acid: 1.71 mmol/L — ABNORMAL HIGH (ref 0.05–0.27)

## 2021-07-28 LAB — MAGNESIUM: Magnesium: 1.3 mg/dL — ABNORMAL LOW (ref 1.7–2.4)

## 2021-07-28 LAB — FERRITIN: Ferritin: 140 ng/mL (ref 11–307)

## 2021-07-28 LAB — D-DIMER, QUANTITATIVE: D-Dimer, Quant: 0.51 ug/mL-FEU — ABNORMAL HIGH (ref 0.00–0.50)

## 2021-07-28 MED ORDER — INSULIN ASPART 100 UNIT/ML IJ SOLN: 0.0000 [IU] | Freq: Every day | INTRAMUSCULAR | Status: DC

## 2021-07-28 MED ORDER — POTASSIUM CHLORIDE 10 MEQ/100ML IV SOLN
10.0000 meq | INTRAVENOUS | Status: AC
  Administered 2021-07-28 (×6): 10 meq via INTRAVENOUS
  Filled 2021-07-28 (×6): qty 100

## 2021-07-28 MED ORDER — POTASSIUM CHLORIDE CRYS ER 20 MEQ PO TBCR
40.0000 meq | EXTENDED_RELEASE_TABLET | Freq: Two times a day (BID) | ORAL | Status: AC
  Administered 2021-07-28 (×2): 40 meq via ORAL
  Filled 2021-07-28 (×2): qty 2

## 2021-07-28 MED ORDER — INSULIN ASPART 100 UNIT/ML IJ SOLN
4.0000 [IU] | Freq: Three times a day (TID) | INTRAMUSCULAR | Status: DC
  Administered 2021-07-28 (×2): 4 [IU] via SUBCUTANEOUS

## 2021-07-28 MED ORDER — INSULIN GLARGINE-YFGN 100 UNIT/ML ~~LOC~~ SOLN
25.0000 [IU] | Freq: Every day | SUBCUTANEOUS | Status: DC
  Administered 2021-07-28 – 2021-07-29 (×2): 25 [IU] via SUBCUTANEOUS
  Filled 2021-07-28 (×2): qty 0.25

## 2021-07-28 MED ORDER — POTASSIUM CHLORIDE CRYS ER 20 MEQ PO TBCR
40.0000 meq | EXTENDED_RELEASE_TABLET | Freq: Once | ORAL | Status: AC
  Administered 2021-07-28: 07:00:00 40 meq via ORAL
  Filled 2021-07-28: qty 2

## 2021-07-28 MED ORDER — MAGNESIUM OXIDE -MG SUPPLEMENT 400 (240 MG) MG PO TABS
400.0000 mg | ORAL_TABLET | Freq: Two times a day (BID) | ORAL | Status: AC
  Administered 2021-07-28 (×2): 400 mg via ORAL
  Filled 2021-07-28 (×2): qty 1

## 2021-07-28 MED ORDER — INSULIN ASPART 100 UNIT/ML IJ SOLN
0.0000 [IU] | Freq: Three times a day (TID) | INTRAMUSCULAR | Status: DC
  Administered 2021-07-28 – 2021-07-29 (×3): 3 [IU] via SUBCUTANEOUS

## 2021-07-28 NOTE — ED Notes (Signed)
ED TO INPATIENT HANDOFF REPORT  ED Nurse Name and Phone #: McKaley [phone]  S Name/Age/Gender Kelli Davis 20 y.o. female Room/Bed: 032C/032C  Code Status   Code Status: Full Code  Home/SNF/Other Home Patient oriented to: self, place, time, and situation Is this baseline? Yes   Triage Complete: Triage complete  Chief Complaint DKA (diabetic ketoacidosis) ([place]) [E11.10]  Triage Note Patient here with nausea and vomiting.  She states she has the flu and that she has Type I diabetes, dumping ketones in her urine.  Patient's lips are chapped and peely.     Allergies Allergies  Allergen Reactions   Penicillins Hives    Level of Care/Admitting Diagnosis ED Disposition     ED Disposition  Admit   Condition  --   Comment  Hospital Area: [facility] [[id number]]  Level of Care: Progressive [[id number]]  Admit to Progressive based on following criteria: GI, ENDOCRINE disease patients with GI bleeding, acute liver failure or pancreatitis, stable with diabetic ketoacidosis or thyrotoxicosis (hypothyroid) state.  May admit patient to [facility] or [facility] if equivalent level of care is available:: Yes  Covid Evaluation: Confirmed COVID Positive  Diagnosis: DKA (diabetic ketoacidosis) ([place]) [id number]  Admitting Physician: Karmen Bongo [[id number]]  Attending Physician: Karmen Bongo [[id number]]  Estimated length of stay: past midnight tomorrow  Certification:: I certify this patient will need inpatient services for at least 2 midnights          B Medical/Surgery History Past Medical History:  Diagnosis Date   ADD (attention deficit disorder)    Eating disorder    Febrile seizure ([place])    History of eye surgery    Physical growth delay    Type 1 diabetes mellitus not at goal [facility])    Past Surgical History:  Procedure Laterality Date   EYE MUSCLE SURGERY       A IV Location/Drains/Wounds Patient Lines/Drains/Airways Status     Active  Line/Drains/Airways     Name Placement date Placement time Site Days   Peripheral IV 07/27/21 20 G 1.16" Anterior;Right;Upper Arm 07/27/21  0630  Arm  1   Peripheral IV 07/28/21 22 G Distal;Left;Posterior Forearm 07/28/21  0749  Forearm  less than 1            Intake/Output Last 24 hours  Intake/Output Summary (Last 24 hours) at 07/28/2021 1327 Last data filed at 07/27/2021 1733 [place] per 24 hour  Intake --  Output 2000 ml  Net -2000 ml    Labs/Imaging Results for orders placed or performed during the hospital encounter of 07/27/21 (from the past 48 hour(s))  Basic metabolic panel     Status: Abnormal   Collection Time: 07/27/21 12:06 AM  Result Value Ref Range   Sodium 133 (L) 135 - 145 mmol/L   Potassium 2.8 (L) 3.5 - 5.1 mmol/L   Chloride 105 98 - 111 mmol/L   CO2 20 (L) 22 - 32 mmol/L   Glucose, Bld 197 (H) 70 - 99 mg/dL    Comment: Glucose reference range applies only to samples taken after fasting for at least 8 hours.   BUN <5 (L) 6 - 20 mg/dL   Creatinine, Ser 0.76 0.44 - 1.00 mg/dL   Calcium 7.6 (L) 8.9 - 10.3 mg/dL   GFR, Estimated >60 >60 mL/min    Comment: (NOTE) Calculated using the CKD-EPI Creatinine Equation (2021)    Anion gap 8 5 - 15    Comment: Performed at [facility], [address] [place], [place] [address]  Beta-hydroxybutyric acid     Status: Abnormal   Collection Time: 07/27/21 12:07 AM  Result Value Ref Range   Beta-Hydroxybutyric Acid 1.71 (H) 0.05 - 0.27 mmol/L    Comment: Performed at [facility], [address], [place], [place] [address]  Urinalysis, Routine w reflex microscopic Urine, Clean Catch     Status: Abnormal   Collection Time: 07/27/21  5:44 AM  Result Value Ref Range   Color, Urine YELLOW YELLOW   APPearance CLEAR CLEAR   Specific Gravity, Urine >1.030 (H) 1.005 - 1.030   pH 5.5 5.0 - 8.0   Glucose, UA >=500 (A) NEGATIVE mg/dL   Hgb urine dipstick NEGATIVE NEGATIVE   Bilirubin Urine SMALL (A) NEGATIVE    Ketones, ur >80 (A) NEGATIVE mg/dL   Protein, ur NEGATIVE NEGATIVE mg/dL   Nitrite NEGATIVE NEGATIVE   Leukocytes,Ua NEGATIVE NEGATIVE    Comment: Performed at [facility], [address] [place], [place] [address]  Urinalysis, Microscopic (reflex)     Status: Abnormal   Collection Time: 07/27/21  5:44 AM  Result Value Ref Range   RBC / HPF 0-5 0 - 5 RBC/hpf   WBC, UA 0-5 0 - 5 WBC/hpf   Bacteria, UA RARE (A) NONE SEEN   Squamous Epithelial / LPF 0-5 0 - 5   Mucus PRESENT     Comment: Performed at [facility], [address] [place], [place] [address]  Resp Panel by RT-PCR (Flu A&B, Covid) Urine, Clean Catch     Status: Abnormal   Collection Time: 07/27/21  5:46 AM   Specimen: Urine, Clean Catch; Nasopharyngeal(NP) swabs in vial transport medium  Result Value Ref Range   SARS Coronavirus 2 by RT PCR POSITIVE (A) NEGATIVE    Comment: RESULT CALLED TO, READ BACK BY AND VERIFIED WITH: RN C COBB [id number] AT 723 AM BY CM (NOTE) SARS-CoV-2 target nucleic acids are DETECTED.  The SARS-CoV-2 RNA is generally detectable in upper respiratory specimens during the acute phase of infection. Positive results are indicative of the presence of the identified virus, but do not rule out bacterial infection or co-infection with other pathogens not detected by the test. Clinical correlation with patient history and other diagnostic information is necessary to determine patient infection status. The expected result is Negative.  Fact Sheet for Patients: BloggerCourse.com  Fact Sheet for Healthcare Providers: SeriousBroker.it  This test is not yet approved or cleared by the [place] FDA and  has been authorized for detection and/or diagnosis of SARS-CoV-2 by FDA under an Emergency Use Authorization (EUA).  This EUA will remain in effect (meaning this test can be  used) for the duration of  the COVID-19 declaration under Section  564(b)(1) of the Act, 21 U.S.C. section 360bbb-3(b)(1), unless the authorization is terminated or revoked sooner.     Influenza A by PCR POSITIVE (A) NEGATIVE   Influenza B by PCR NEGATIVE NEGATIVE    Comment: (NOTE) The Xpert Xpress SARS-CoV-2/FLU/RSV plus assay is intended as an aid in the diagnosis of influenza from Nasopharyngeal swab specimens and should not be used as a sole basis for treatment. Nasal washings and aspirates are unacceptable for Xpert Xpress SARS-CoV-2/FLU/RSV testing.  Fact Sheet for Patients: BloggerCourse.com  Fact Sheet for Healthcare Providers: SeriousBroker.it  This test is not yet approved or cleared by the [place] FDA and has been authorized for detection and/or diagnosis of SARS-CoV-2 by FDA under an Emergency  Use Authorization (EUA). This EUA will remain in effect (meaning this test can be used) for the duration of the COVID-19 declaration under Section 564(b)(1) of the Act, 21 U.S.C. section 360bbb-3(b)(1), unless the authorization is terminated or revoked.  Performed at [facility], [place] [address], [place], [place] [address]   CBC with Differential     Status: Abnormal   Collection Time: 07/27/21  5:49 AM  Result Value Ref Range   WBC 5.4 4.0 - 10.5 K/uL   RBC 5.39 (H) 3.87 - 5.11 MIL/uL   Hemoglobin 17.0 (H) 12.0 - 15.0 g/dL   HCT 51.6 (H) 36.0 - 46.0 %   MCV 95.7 80.0 - 100.0 fL   MCH 31.5 26.0 - 34.0 pg   MCHC 32.9 30.0 - 36.0 g/dL   RDW 12.3 11.5 - 15.5 %   Platelets 302 150 - 400 K/uL   nRBC 0.0 0.0 - 0.2 %   Neutrophils Relative % 78 %   Neutro Abs 4.2 1.7 - 7.7 K/uL   Lymphocytes Relative 18 %   Lymphs Abs 1.0 0.7 - 4.0 K/uL   Monocytes Relative 3 %   Monocytes Absolute 0.2 0.1 - 1.0 K/uL   Eosinophils Relative 0 %   Eosinophils Absolute 0.0 0.0 - 0.5 K/uL   Basophils Relative 0 %   Basophils Absolute 0.0 0.0 - 0.1 K/uL   Immature Granulocytes 1 %   Abs  Immature Granulocytes 0.03 0.00 - 0.07 K/uL    Comment: Performed at [facility] [address], [place], [place] [address]  Comprehensive metabolic panel     Status: Abnormal   Collection Time: 07/27/21  5:49 AM  Result Value Ref Range   Sodium 132 (L) 135 - 145 mmol/L   Potassium 4.6 3.5 - 5.1 mmol/L   Chloride 99 98 - 111 mmol/L   CO2 8 (L) 22 - 32 mmol/L   Glucose, Bld 475 (H) 70 - 99 mg/dL    Comment: Glucose reference range applies only to samples taken after fasting for at least 8 hours.   BUN 11 6 - 20 mg/dL   Creatinine, Ser 1.61 (H) 0.44 - 1.00 mg/dL   Calcium 9.0 8.9 - 10.3 mg/dL   Total Protein 8.0 6.5 - 8.1 g/dL   Albumin 4.0 3.5 - 5.0 g/dL   AST 22 15 - 41 U/L   ALT 24 0 - 44 U/L   Alkaline Phosphatase 99 38 - 126 U/L   Total Bilirubin 1.9 (H) 0.3 - 1.2 mg/dL   GFR, Estimated 46 (L) >60 mL/min    Comment: (NOTE) Calculated using the CKD-EPI Creatinine Equation (2021)    Anion gap 25 (H) 5 - 15    Comment: REPEATED TO VERIFY Performed at [facility] [address] [place], [place] [address]   Lipase, blood     Status: Abnormal   Collection Time: 07/27/21  5:49 AM  Result Value Ref Range   Lipase 85 (H) 11 - 51 U/L    Comment: Performed at [facility], [place] [address] [place], [place] [address]  Beta-hydroxybutyric acid     Status: Abnormal   Collection Time: 07/27/21  5:49 AM  Result Value Ref Range   Beta-Hydroxybutyric Acid >8.00 (H) 0.05 - 0.27 mmol/L    Comment: RESULTS CONFIRMED BY MANUAL DILUTION Performed at [facility] [address] [place],  [address]   POC CBG, ED     Status: Abnormal  Collection Time: 07/27/21  5:56 AM  Result Value Ref Range   Glucose-Capillary 481 (H) 70 - 99 mg/dL    Comment: Glucose reference range applies only to samples taken after fasting for at least 8 hours.  I-stat chem 8, ED (not at [facility] or [facility])     Status: Abnormal   Collection Time: 07/27/21  6:37 AM  Result Value Ref Range    Sodium 132 (L) 135 - 145 mmol/L   Potassium 4.6 3.5 - 5.1 mmol/L   Chloride 105 98 - 111 mmol/L   BUN 11 6 - 20 mg/dL   Creatinine, Ser 0.50 0.44 - 1.00 mg/dL   Glucose, Bld 463 (H) 70 - 99 mg/dL    Comment: Glucose reference range applies only to samples taken after fasting for at least 8 hours.   Calcium, Ion 1.14 (L) 1.15 - 1.40 mmol/L   TCO2 10 (L) 22 - 32 mmol/L   Hemoglobin 17.3 (H) 12.0 - 15.0 g/dL   HCT 51.0 (H) 36.0 - 46.0 %  I-Stat venous blood gas, ([facility])     Status: Abnormal   Collection Time: 07/27/21  6:44 AM  Result Value Ref Range   pH, Ven 7.108 (LL) 7.250 - 7.430   pCO2, Ven 24.2 (L) 44.0 - 60.0 mmHg   pO2, Ven 80.0 (H) 32.0 - 45.0 mmHg   Bicarbonate 7.7 (L) 20.0 - 28.0 mmol/L   TCO2 8 (L) 22 - 32 mmol/L   O2 Saturation 91.0 %   Acid-base deficit 20.0 (H) 0.0 - 2.0 mmol/L   Sodium 132 (L) 135 - 145 mmol/L   Potassium 4.7 3.5 - 5.1 mmol/L   Calcium, Ion 1.14 (L) 1.15 - 1.40 mmol/L   HCT 50.0 (H) 36.0 - 46.0 %   Hemoglobin 17.0 (H) 12.0 - 15.0 g/dL   Sample type VENOUS    Comment NOTIFIED PHYSICIAN   I-Stat Beta hCG blood, ED ([facility], [facility], [facility] only)     Status: None   Collection Time: 07/27/21  6:48 AM  Result Value Ref Range   I-stat hCG, quantitative <5.0 <5 mIU/mL   Comment 3            Comment:   GEST. AGE      CONC.  (mIU/mL)   <=1 WEEK        5 - 50     2 WEEKS       50 - 500     3 WEEKS       100 - 10,000     4 WEEKS     1,000 - 30,000        FEMALE AND NON-PREGNANT FEMALE:     LESS THAN 5 mIU/mL   CBG monitoring, ED     Status: Abnormal   Collection Time: 07/27/21  7:34 AM  Result Value Ref Range   Glucose-Capillary 438 (H) 70 - 99 mg/dL    Comment: Glucose reference range applies only to samples taken after fasting for at least 8 hours.  CBG monitoring, ED     Status: Abnormal   Collection Time: 07/27/21  8:02 AM  Result Value Ref Range   Glucose-Capillary 477 (H) 70 - 99 mg/dL    Comment: Glucose reference range applies only to samples taken  after fasting for at least 8 hours.  CBG monitoring, ED     Status: Abnormal   Collection Time: 07/27/21  8:30 AM  Result Value Ref Range   Glucose-Capillary 412 (H) 70 - 99 mg/dL  Comment: Glucose reference range applies only to samples taken after fasting for at least 8 hours.   Comment 1 Notify RN    Comment 2 Document in Chart   CBG monitoring, ED     Status: Abnormal   Collection Time: 07/27/21  9:14 AM  Result Value Ref Range   Glucose-Capillary 274 (H) 70 - 99 mg/dL    Comment: Glucose reference range applies only to samples taken after fasting for at least 8 hours.   Comment 1 Notify RN    Comment 2 Document in Chart   CBG monitoring, ED     Status: Abnormal   Collection Time: 07/27/21  9:41 AM  Result Value Ref Range   Glucose-Capillary 255 (H) 70 - 99 mg/dL    Comment: Glucose reference range applies only to samples taken after fasting for at least 8 hours.   Comment 1 Notify RN    Comment 2 Document in Chart   C-reactive protein     Status: Abnormal   Collection Time: 07/27/21  9:51 AM  Result Value Ref Range   CRP 1.5 (H) <1.0 mg/dL    Comment: Performed at [facility], [address] [place], [place] [address]  D-dimer, quantitative (not at [facility])     Status: Abnormal   Collection Time: 07/27/21  9:51 AM  Result Value Ref Range   D-Dimer, Quant 0.59 (H) 0.00 - 0.50 ug/mL-FEU    Comment: (NOTE) At the manufacturer cut-off value of 0.5 g/mL FEU, this assay has a negative predictive value of 95-100%.This assay is intended for use in conjunction with a clinical pretest probability (PTP) assessment model to exclude pulmonary embolism (PE) and deep venous thrombosis (DVT) in outpatients suspected of PE or DVT. Results should be correlated with clinical presentation. Performed at [facility], [address] [place], [place] [address]   Ferritin     Status: None   Collection Time: 07/27/21  9:51 AM  Result Value Ref Range   Ferritin 128 11 - 307  ng/mL    Comment: Performed at [facility], [address] [place], [place] [address]  Fibrinogen     Status: None   Collection Time: 07/27/21  9:51 AM  Result Value Ref Range   Fibrinogen 321 210 - 475 mg/dL    Comment: (NOTE) Fibrinogen results may be underestimated in patients receiving thrombolytic therapy. Performed at [facility], [address] [place], [place] [address]   Lactate dehydrogenase     Status: Abnormal   Collection Time: 07/27/21  9:51 AM  Result Value Ref Range   LDH 207 (H) 98 - 192 U/L    Comment: Performed at [facility], [address] [place], [place] [address]  Procalcitonin     Status: None   Collection Time: 07/27/21  9:51 AM  Result Value Ref Range   Procalcitonin <0.10 ng/mL    Comment:        Interpretation: PCT (Procalcitonin) <= 0.5 ng/mL: Systemic infection (sepsis) is not likely. Local bacterial infection is possible. (NOTE)       Sepsis PCT Algorithm           Lower Respiratory Tract                                      Infection PCT Algorithm    ----------------------------     ----------------------------  PCT < 0.25 ng/mL                PCT < 0.10 ng/mL          Strongly encourage             Strongly discourage   discontinuation of antibiotics    initiation of antibiotics    ----------------------------     -----------------------------       PCT 0.25 - 0.50 ng/mL            PCT 0.10 - 0.25 ng/mL               OR       >80% decrease in PCT            Discourage initiation of                                            antibiotics      Encourage discontinuation           of antibiotics    ----------------------------     -----------------------------         PCT >= 0.50 ng/mL              PCT 0.26 - 0.50 ng/mL               AND        <80% decrease in PCT             Encourage initiation of                                             antibiotics       Encourage continuation           of antibiotics     ----------------------------     -----------------------------        PCT >= 0.50 ng/mL                  PCT > 0.50 ng/mL               AND         increase in PCT                  Strongly encourage                                      initiation of antibiotics    Strongly encourage escalation           of antibiotics                                     -----------------------------                                           PCT <= 0.25 ng/mL  OR                                        > 80% decrease in PCT                                      Discontinue / Do not initiate                                             antibiotics  Performed at [facility], [place] [address] [place], [place] [id number]   Basic metabolic panel     Status: Abnormal   Collection Time: 07/27/21  9:51 AM  Result Value Ref Range   Sodium 134 (L) 135 - 145 mmol/L   Potassium 4.7 3.5 - 5.1 mmol/L    Comment: SLIGHT HEMOLYSIS   Chloride 104 98 - 111 mmol/L   CO2 9 (L) 22 - 32 mmol/L   Glucose, Bld 244 (H) 70 - 99 mg/dL    Comment: Glucose reference range applies only to samples taken after fasting for at least 8 hours.   BUN 9 6 - 20 mg/dL   Creatinine, Ser 1.29 (H) 0.44 - 1.00 mg/dL   Calcium 8.3 (L) 8.9 - 10.3 mg/dL   GFR, Estimated >60 >60 mL/min    Comment: (NOTE) Calculated using the CKD-EPI Creatinine Equation (2021)    Anion gap 21 (H) 5 - 15    Comment: REPEATED TO VERIFY Performed at [facility] [address], [place], [place] [address]   Beta-hydroxybutyric acid     Status: Abnormal   Collection Time: 07/27/21  9:51 AM  Result Value Ref Range   Beta-Hydroxybutyric Acid 7.53 (H) 0.05 - 0.27 mmol/L    Comment: RESULTS CONFIRMED BY MANUAL DILUTION Performed at [facility] [address] [place], [place] [address]   CBG monitoring, ED     Status: Abnormal   Collection Time: 07/27/21 10:01 AM  Result Value Ref Range    Glucose-Capillary 221 (H) 70 - 99 mg/dL    Comment: Glucose reference range applies only to samples taken after fasting for at least 8 hours.  CBG monitoring, ED     Status: Abnormal   Collection Time: 07/27/21 11:08 AM  Result Value Ref Range   Glucose-Capillary 163 (H) 70 - 99 mg/dL    Comment: Glucose reference range applies only to samples taken after fasting for at least 8 hours.  CBG monitoring, ED     Status: Abnormal   Collection Time: 07/27/21 12:08 PM  Result Value Ref Range   Glucose-Capillary 189 (H) 70 - 99 mg/dL    Comment: Glucose reference range applies only to samples taken after fasting for at least 8 hours.   Comment 1 Notify RN    Comment 2 Document in Chart   Basic metabolic panel     Status: Abnormal   Collection Time: 07/27/21 12:25 PM  Result Value Ref Range   Sodium 133 (L) 135 - 145 mmol/L   Potassium 4.4 3.5 - 5.1 mmol/L   Chloride 104 98 - 111 mmol/L   CO2 9 (L) 22 - 32 mmol/L   Glucose, Bld 248 (H) 70 -  99 mg/dL    Comment: Glucose reference range applies only to samples taken after fasting for at least 8 hours.   BUN 6 6 - 20 mg/dL   Creatinine, Ser 1.12 (H) 0.44 - 1.00 mg/dL   Calcium 8.6 (L) 8.9 - 10.3 mg/dL   GFR, Estimated >60 >60 mL/min    Comment: (NOTE) Calculated using the CKD-EPI Creatinine Equation (2021)    Anion gap 20 (H) 5 - 15    Comment: Performed at [facility] [address] [place], [place] [address]  CBG monitoring, ED     Status: Abnormal   Collection Time: 07/27/21  1:28 PM  Result Value Ref Range   Glucose-Capillary 281 (H) 70 - 99 mg/dL    Comment: Glucose reference range applies only to samples taken after fasting for at least 8 hours.   Comment 1 Notify RN    Comment 2 Document in Chart   CBG monitoring, ED     Status: Abnormal   Collection Time: 07/27/21  2:40 PM  Result Value Ref Range   Glucose-Capillary 234 (H) 70 - 99 mg/dL    Comment: Glucose reference range applies only to samples taken after fasting  for at least 8 hours.  CBG monitoring, ED     Status: Abnormal   Collection Time: 07/27/21  3:42 PM  Result Value Ref Range   Glucose-Capillary 184 (H) 70 - 99 mg/dL    Comment: Glucose reference range applies only to samples taken after fasting for at least 8 hours.  Basic metabolic panel     Status: Abnormal   Collection Time: 07/27/21  4:25 PM  Result Value Ref Range   Sodium 135 135 - 145 mmol/L   Potassium 3.6 3.5 - 5.1 mmol/L   Chloride 108 98 - 111 mmol/L   CO2 16 (L) 22 - 32 mmol/L   Glucose, Bld 174 (H) 70 - 99 mg/dL    Comment: Glucose reference range applies only to samples taken after fasting for at least 8 hours.   BUN <5 (L) 6 - 20 mg/dL   Creatinine, Ser 0.93 0.44 - 1.00 mg/dL   Calcium 8.0 (L) 8.9 - 10.3 mg/dL   GFR, Estimated >60 >60 mL/min    Comment: (NOTE) Calculated using the CKD-EPI Creatinine Equation (2021)    Anion gap 11 5 - 15    Comment: Performed at [facility] [address] [place], [place] [address]  Beta-hydroxybutyric acid     Status: Abnormal   Collection Time: 07/27/21  4:25 PM  Result Value Ref Range   Beta-Hydroxybutyric Acid 4.06 (H) 0.05 - 0.27 mmol/L    Comment: Performed at [facility] [address], [place], [place] [address]  CBG monitoring, ED     Status: Abnormal   Collection Time: 07/27/21  5:03 PM  Result Value Ref Range   Glucose-Capillary 168 (H) 70 - 99 mg/dL    Comment: Glucose reference range applies only to samples taken after fasting for at least 8 hours.   Comment 1 Notify RN    Comment 2 Document in Chart   CBG monitoring, ED     Status: Abnormal   Collection Time: 07/27/21  6:31 PM  Result Value Ref Range   Glucose-Capillary 161 (H) 70 - 99 mg/dL    Comment: Glucose reference range applies only to samples taken after fasting for at least 8 hours.  CBG monitoring, ED     Status: Abnormal   Collection Time:  07/27/21  8:01 PM  Result Value Ref Range   Glucose-Capillary 139 (H) 70 - 99 mg/dL     Comment: Glucose reference range applies only to samples taken after fasting for at least 8 hours.  CBG monitoring, ED     Status: Abnormal   Collection Time: 07/27/21  9:23 PM  Result Value Ref Range   Glucose-Capillary 225 (H) 70 - 99 mg/dL    Comment: Glucose reference range applies only to samples taken after fasting for at least 8 hours.  CBG monitoring, ED     Status: Abnormal   Collection Time: 07/27/21 11:31 PM  Result Value Ref Range   Glucose-Capillary 208 (H) 70 - 99 mg/dL    Comment: Glucose reference range applies only to samples taken after fasting for at least 8 hours.  CBG monitoring, ED     Status: Abnormal   Collection Time: 07/28/21 12:32 AM  Result Value Ref Range   Glucose-Capillary 178 (H) 70 - 99 mg/dL    Comment: Glucose reference range applies only to samples taken after fasting for at least 8 hours.  CBG monitoring, ED     Status: Abnormal   Collection Time: 07/28/21  1:31 AM  Result Value Ref Range   Glucose-Capillary 154 (H) 70 - 99 mg/dL    Comment: Glucose reference range applies only to samples taken after fasting for at least 8 hours.  Basic metabolic panel     Status: Abnormal   Collection Time: 07/28/21  2:58 AM  Result Value Ref Range   Sodium 135 135 - 145 mmol/L   Potassium 2.7 (LL) 3.5 - 5.1 mmol/L    Comment: CRITICAL RESULT CALLED TO, READ BACK BY AND VERIFIED WITH: LAMONT DANIELS RN 07/28/21 0416 M KOROLESKI    Chloride 104 98 - 111 mmol/L   CO2 22 22 - 32 mmol/L   Glucose, Bld 261 (H) 70 - 99 mg/dL    Comment: Glucose reference range applies only to samples taken after fasting for at least 8 hours.   BUN <5 (L) 6 - 20 mg/dL   Creatinine, Ser 0.78 0.44 - 1.00 mg/dL   Calcium 7.5 (L) 8.9 - 10.3 mg/dL   GFR, Estimated >60 >60 mL/min    Comment: (NOTE) Calculated using the CKD-EPI Creatinine Equation (2021)    Anion gap 9 5 - 15    Comment: Performed at [facility] [address], [place], [place] [address]  C-reactive protein      Status: None   Collection Time: 07/28/21  2:58 AM  Result Value Ref Range   CRP <0.5 <1.0 mg/dL    Comment: Performed at [facility], [place] [address], [place], [place] [address]  D-dimer, quantitative (not at [facility])     Status: Abnormal   Collection Time: 07/28/21  2:58 AM  Result Value Ref Range   D-Dimer, Quant 0.51 (H) 0.00 - 0.50 ug/mL-FEU    Comment: (NOTE) At the manufacturer cut-off value of 0.5 g/mL FEU, this assay has a negative predictive value of 95-100%.This assay is intended for use in conjunction with a clinical pretest probability (PTP) assessment model to exclude pulmonary embolism (PE) and deep venous thrombosis (DVT) in outpatients suspected of PE or DVT. Results should be correlated with clinical presentation. Performed at [facility] [facility], [place] [address] [place], [place] [address]   Ferritin     Status: None   Collection Time: 07/28/21  2:58 AM  Result Value Ref Range   Ferritin  140 11 - 307 ng/mL    Comment: Performed at [facility], [place] [address], [place], [place] [address]  Magnesium     Status: Abnormal   Collection Time: 07/28/21  2:58 AM  Result Value Ref Range   Magnesium 1.3 (L) 1.7 - 2.4 mg/dL    Comment: Performed at [facility] [address], [place], [place] [address]  Phosphorus     Status: Abnormal   Collection Time: 07/28/21  2:58 AM  Result Value Ref Range   Phosphorus 2.2 (L) 2.5 - 4.6 mg/dL    Comment: Performed at [facility] [address] [place], [place] [address]  CBG monitoring, ED     Status: Abnormal   Collection Time: 07/28/21  3:20 AM  Result Value Ref Range   Glucose-Capillary 229 (H) 70 - 99 mg/dL    Comment: Glucose reference range applies only to samples taken after fasting for at least 8 hours.  CBG monitoring, ED     Status: Abnormal   Collection Time: 07/28/21  4:45 AM  Result Value Ref Range   Glucose-Capillary 176 (H) 70 - 99 mg/dL    Comment: Glucose reference range applies only to  samples taken after fasting for at least 8 hours.  CBG monitoring, ED     Status: Abnormal   Collection Time: 07/28/21  6:03 AM  Result Value Ref Range   Glucose-Capillary 158 (H) 70 - 99 mg/dL    Comment: Glucose reference range applies only to samples taken after fasting for at least 8 hours.  CBG monitoring, ED     Status: Abnormal   Collection Time: 07/28/21  7:32 AM  Result Value Ref Range   Glucose-Capillary 206 (H) 70 - 99 mg/dL    Comment: Glucose reference range applies only to samples taken after fasting for at least 8 hours.  CBG monitoring, ED     Status: Abnormal   Collection Time: 07/28/21  8:34 AM  Result Value Ref Range   Glucose-Capillary 241 (H) 70 - 99 mg/dL    Comment: Glucose reference range applies only to samples taken after fasting for at least 8 hours.  CBG monitoring, ED     Status: Abnormal   Collection Time: 07/28/21 11:51 AM  Result Value Ref Range   Glucose-Capillary 186 (H) 70 - 99 mg/dL    Comment: Glucose reference range applies only to samples taken after fasting for at least 8 hours.   DG CHEST PORT 1 VIEW  Result Date: 07/27/2021 CLINICAL DATA:  COVID, nausea, vomiting, diabetes EXAM: PORTABLE CHEST 1 VIEW COMPARISON:  None. FINDINGS: The heart size and mediastinal contours are within normal limits. Both lungs are clear. The visualized skeletal structures are unremarkable. IMPRESSION: No acute abnormality of the lungs in AP portable projection. Electronically Signed   By: Delanna Ahmadi M.D.   On: 07/27/2021 09:11    Pending Labs Unresulted Labs (From admission, onward)     Start     Ordered   07/29/21 0721  Magnesium  Once,   R        07/28/21 0721   07/29/21 [id number]  Basic metabolic panel  Tomorrow morning,   R        07/28/21 0721   07/28/21 0500  CBC with Differential/Platelet  Daily,   R      07/27/21 0829   07/28/21 0500  C-reactive protein  Daily,   R      07/27/21 0829   07/28/21 0500  D-dimer, quantitative (not at [facility])  Daily,   R       07/27/21 0829   07/28/21 0500  Ferritin  Daily,   R      07/27/21 0829   07/28/21 0500  Magnesium  Daily,   R      07/27/21 0829   07/28/21 0500  Phosphorus  Daily,   R      07/27/21 0829   07/28/21 0500  CBC with Differential/Platelet  Once,   STAT        07/28/21 0500            Vitals/Pain Today's Vitals   07/27/21 2130 07/28/21 0700 07/28/21 1000 07/28/21 1300  BP: 99/66 112/62 112/80 104/75  Pulse: 94 86 89 77  Resp: 16 11 14 14   Temp:    98.3 F (36.8 C)  TempSrc:    Oral  SpO2: 100% 99% 100% 99%  PainSc:        Isolation Precautions Airborne and Contact precautions  Medications Medications  dextrose 5 % in lactated ringers infusion (0 mLs Intravenous Stopped 07/28/21 0931)  dextrose 50 % solution 0-50 mL (has no administration in time range)  insulin regular, human (MYXREDLIN) 100 units/ 100 mL infusion (0 Units/hr Intravenous Stopped 07/28/21 0931)  lactated ringers infusion ( Intravenous New Bag/Given 07/28/21 0934)  prazosin (MINIPRESS) capsule 2 mg (2 mg Oral Given 07/27/21 2135)  buPROPion (WELLBUTRIN XL) 24 hr tablet 300 mg (300 mg Oral Given 07/28/21 0940)  escitalopram (LEXAPRO) tablet 20 mg (20 mg Oral Given 07/28/21 0940)  hydrOXYzine (ATARAX) tablet 10 mg (has no administration in time range)  norgestimate-ethinyl estradiol (ORTHO-CYCLEN) 0.25-35 MG-MCG tablet 1 tablet (has no administration in time range)  enoxaparin (LOVENOX) injection 40 mg (40 mg Subcutaneous Given 07/27/21 1701)  sodium chloride flush (NS) 0.9 % injection 3 mL (3 mLs Intravenous Not Given 07/28/21 0935)  sodium chloride flush (NS) 0.9 % injection 3 mL (3 mLs Intravenous Not Given 07/28/21 0935)  sodium chloride flush (NS) 0.9 % injection 3 mL (has no administration in time range)  0.9 %  sodium chloride infusion (has no administration in time range)  albuterol (VENTOLIN HFA) 108 (90 Base) MCG/ACT inhaler 2 puff (has no administration in time range)  guaiFENesin-dextromethorphan  (ROBITUSSIN DM) 100-10 MG/5ML syrup 10 mL (has no administration in time range)  chlorpheniramine-HYDROcodone (TUSSIONEX) 10-8 MG/5ML suspension 5 mL (has no administration in time range)  ascorbic acid (VITAMIN C) tablet 500 mg (500 mg Oral Given 07/28/21 0940)  zinc sulfate capsule 220 mg (220 mg Oral Given 07/28/21 0940)  acetaminophen (TYLENOL) tablet 650 mg (has no administration in time range)  oxyCODONE (Oxy IR/ROXICODONE) immediate release tablet 5 mg (has no administration in time range)  docusate sodium (COLACE) capsule 100 mg (100 mg Oral Given 07/28/21 0940)  polyethylene glycol (MIRALAX / GLYCOLAX) packet 17 g (has no administration in time range)  bisacodyl (DULCOLAX) EC tablet 5 mg (has no administration in time range)  sodium phosphate (FLEET) 7-19 GM/118ML enema 1 enema (has no administration in time range)  ondansetron (ZOFRAN) tablet 4 mg ( Oral See Alternative 07/28/21 1049)    Or  ondansetron (ZOFRAN) injection 4 mg (4 mg Intravenous Given 07/28/21 1049)  insulin glargine-yfgn (SEMGLEE) injection 25 Units (25 Units Subcutaneous Given 07/28/21 0735)  insulin aspart (novoLOG) injection 0-15 Units (3 Units Subcutaneous Given 07/28/21 1159)  insulin aspart (novoLOG) injection 0-5 Units (has no administration in time range)  insulin aspart (novoLOG) injection 4 Units (4  Units Subcutaneous Given 07/28/21 1158)  potassium chloride SA (KLOR-CON M) CR tablet 40 mEq (40 mEq Oral Given 07/28/21 0940)  magnesium oxide (MAG-OX) tablet 400 mg (400 mg Oral Given 07/28/21 0940)  lactated ringers bolus 1,000 mL (0 mLs Intravenous Stopped 07/27/21 0656)  lactated ringers bolus 1,000 mL (0 mLs Intravenous Stopped 07/27/21 0817)  potassium chloride 10 mEq in 100 mL IVPB (0 mEq Intravenous Stopped 07/27/21 1030)  lactated ringers bolus 1,000 mL (0 mLs Intravenous Stopped 07/27/21 1107)  lactated ringers bolus 2,000 mL (0 mLs Intravenous Stopped 07/27/21 1629)  potassium chloride 10 mEq in 100  mL IVPB (10 mEq Intravenous New Bag/Given 07/28/21 1158)  potassium chloride SA (KLOR-CON M) CR tablet 40 mEq (40 mEq Oral Given 07/28/21 0641)    Mobility walks Low fall risk   Focused Assessments GI   R Recommendations: See Admitting Provider Note  Report given to:   Additional Notes: none

## 2021-07-28 NOTE — Progress Notes (Signed)
[facility] PROGRESS NOTE    Progress Note  Kelli Davis  [id number] DOB: [birth year] DOA: 07/27/2021 PCP: [name], FNP     Brief Narrative:   Kelli Davis is an 20 y.o. female past medical history significant for ADHD, type 1 diabetes mellitus with multiple admissions for DKA, recently discharged from the hospital on 07/22/2021 for DKA in the setting of influenza A, while 2 days at home she started having a productive cough came back to the ED was found in DKA   Assessment/Plan:   Diabetes mellitus type 1/DKA (diabetic ketoacidosis) ([place]): Started on insulin drip her bicarb is closed, bicarbonate is greater than 20, started on long-acting insulin. Allow a diet started on sliding scale insulin.  Will monitor at least for 24 hours. Continue aggressive hydration IV and have encouraged the patient to continue orally.  Acute kidney injury: In the setting of DKA likely prerenal azotemia resolved with IV fluid hydration.  COVID-19 viral infection: Probably precipitating her DKA. Chest x-ray shows no acute infiltrate, CRP is mildly elevated she satting greater 90% on room air. Encourage ambulation and oral hydration.  Hypokalemia/hypomagnesemia: Replete orally recheck in the morning.\  Influenza A: She has completed her treatment.  Mood disorder: Continue Wellbutrin Lexapro hydroxyzine and paroxetine.   DVT prophylaxis: lovenox Family Communication:none Status is: Inpatient  Remains inpatient appropriate because: Acute COVID-19 infection leading to DKA     Code Status:     Code Status Orders  (From admission, onward)           Start     Ordered   07/27/21 0824  Full code  Continuous        07/27/21 0829           Code Status History     Date Active Date Inactive Code Status Order ID Comments User Context   07/21/2021 1732 07/22/2021 1933 Full Code [id number]  Karmen Bongo, MD ED   09/19/2016 1218 09/21/2016 1925 Full Code [id number]   Janell Quiet, MD Inpatient   05/16/2016 1910 05/23/2016 2110 Full Code [id number]  Laural Golden, MD Inpatient   04/11/2016 2129 04/15/2016 1302 Full Code [id number]  Laverle Hobby, PA-C Inpatient   04/11/2016 1021 04/11/2016 2032 Full Code [id number]  Ripley Fraise, MD ED         IV Access:   Peripheral IV   Procedures and diagnostic studies:   DG CHEST PORT 1 VIEW  Result Date: 07/27/2021 CLINICAL DATA:  COVID, nausea, vomiting, diabetes EXAM: PORTABLE CHEST 1 VIEW COMPARISON:  None. FINDINGS: The heart size and mediastinal contours are within normal limits. Both lungs are clear. The visualized skeletal structures are unremarkable. IMPRESSION: No acute abnormality of the lungs in AP portable projection. Electronically Signed   By: Delanna Ahmadi M.D.   On: 07/27/2021 09:11     Medical Consultants:   None.   Subjective:    Michail Jewels relates she feels better this morning no further nausea relates she has an appetite.  Objective:    Vitals:   07/27/21 1800 07/27/21 1830 07/27/21 2130 07/28/21 0700  BP: 112/69 116/79 99/66 112/62  Pulse: 87 (!) 101 94 86  Resp: 13 16 16 11   Temp:      TempSrc:      SpO2: 100% 98% 100% 99%   SpO2: 99 %   Intake/Output Summary (Last 24 hours) at 07/28/2021 0703 Last data filed at 07/27/2021 1733 Gross per 24 hour  Intake --  Output 4000 ml  Net -4000 ml   There were no vitals filed for this visit.  Exam: General exam: In no acute distress. Respiratory system: Good air movement and clear to auscultation. Cardiovascular system: S1 & S2 heard, RRR. No JVD. Gastrointestinal system: Abdomen is nondistended, soft and nontender.  Extremities: No pedal edema. Skin: No rashes, lesions or ulcers Psychiatry: Judgement and insight appear normal. Mood & affect appropriate.    Data Reviewed:    Labs: Basic Metabolic Panel: Recent Labs  Lab 07/27/21 0549 07/27/21 0637 07/27/21 0644 07/27/21 0951 07/27/21 1225  07/27/21 1625 07/28/21 0258  NA 132* 132* 132* 134* 133* 135 135  K 4.6 4.6 4.7 4.7 4.4 3.6 2.7*  CL 99 105  --  104 104 108 104  CO2 8*  --   --  9* 9* 16* 22  GLUCOSE 475* 463*  --  244* 248* 174* 261*  BUN 11 11  --  9 6 <5* <5*  CREATININE 1.61* 0.50  --  1.29* 1.12* 0.93 0.78  CALCIUM 9.0  --   --  8.3* 8.6* 8.0* 7.5*  MG  --   --   --   --   --   --  1.3*  PHOS  --   --   --   --   --   --  2.2*   GFR Estimated Creatinine Clearance: 79.9 mL/min (by C-G formula based on SCr of 0.78 mg/dL). Liver Function Tests: Recent Labs  Lab 07/27/21 0549  AST 22  ALT 24  ALKPHOS 99  BILITOT 1.9*  PROT 8.0  ALBUMIN 4.0   Recent Labs  Lab 07/27/21 0549  LIPASE 85*   No results for input(s): AMMONIA in the last 168 hours. Coagulation profile No results for input(s): INR, PROTIME in the last 168 hours. COVID-19 Labs  Recent Labs    07/27/21 0951 07/28/21 0258  DDIMER 0.59* 0.51*  FERRITIN 128 140  LDH 207*  --   CRP 1.5* <0.5    Lab Results  Component Value Date   SARSCOV2NAA POSITIVE (A) 07/27/2021   SARSCOV2NAA NEGATIVE 07/21/2021   SARSCOV2NAA Not Detected 06/06/2019    CBC: Recent Labs  Lab 07/21/21 1324 07/21/21 1346 07/27/21 0549 07/27/21 0637 07/27/21 0644  WBC 5.6  --  5.4  --   --   NEUTROABS 4.6  --  4.2  --   --   HGB 16.1* 16.7* 17.0* 17.3* 17.0*  HCT 47.3* 49.0* 51.6* 51.0* 50.0*  MCV 92.6  --  95.7  --   --   PLT 223  --  302  --   --    Cardiac Enzymes: No results for input(s): CKTOTAL, CKMB, CKMBINDEX, TROPONINI in the last 168 hours. BNP (last 3 results) No results for input(s): PROBNP in the last 8760 hours. CBG: Recent Labs  Lab 07/28/21 0032 07/28/21 0131 07/28/21 0320 07/28/21 0445 07/28/21 0603  GLUCAP 178* 154* 229* 176* 158*   D-Dimer: Recent Labs    07/27/21 0951 07/28/21 0258  DDIMER 0.59* 0.51*   Hgb A1c: No results for input(s): HGBA1C in the last 72 hours. Lipid Profile: No results for input(s): CHOL, HDL,  LDLCALC, TRIG, CHOLHDL, LDLDIRECT in the last 72 hours. Thyroid function studies: No results for input(s): TSH, T4TOTAL, T3FREE, THYROIDAB in the last 72 hours.  Invalid input(s): FREET3 Anemia work up: Recent Labs    07/27/21 0951 07/28/21 0258  FERRITIN 128 140   Sepsis Labs: Recent Labs  Lab 07/21/21 1324 07/27/21 0549 07/27/21 3267  PROCALCITON  --   --  <0.10  WBC 5.6 5.4  --    Microbiology Recent Results (from the past 240 hour(s))  Resp Panel by RT-PCR (Flu A&B, Covid) Nasopharyngeal Swab     Status: Abnormal   Collection Time: 07/21/21  1:21 PM   Specimen: Nasopharyngeal Swab; Nasopharyngeal(NP) swabs in vial transport medium  Result Value Ref Range Status   SARS Coronavirus 2 by RT PCR NEGATIVE NEGATIVE Final    Comment: (NOTE) SARS-CoV-2 target nucleic acids are NOT DETECTED.  The SARS-CoV-2 RNA is generally detectable in upper respiratory specimens during the acute phase of infection. The lowest concentration of SARS-CoV-2 viral copies this assay can detect is 138 copies/mL. A negative result does not preclude SARS-Cov-2 infection and should not be used as the sole basis for treatment or other patient management decisions. A negative result may occur with  improper specimen collection/handling, submission of specimen other than nasopharyngeal swab, presence of viral mutation(s) within the areas targeted by this assay, and inadequate number of viral copies(<138 copies/mL). A negative result must be combined with clinical observations, patient history, and epidemiological information. The expected result is Negative.  Fact Sheet for Patients:  EntrepreneurPulse.com.au  Fact Sheet for Healthcare Providers:  IncredibleEmployment.be  This test is no t yet approved or cleared by the [place] FDA and  has been authorized for detection and/or diagnosis of SARS-CoV-2 by FDA under an Emergency Use Authorization (EUA). This  EUA will remain  in effect (meaning this test can be used) for the duration of the COVID-19 declaration under Section 564(b)(1) of the Act, 21 U.S.C.section 360bbb-3(b)(1), unless the authorization is terminated  or revoked sooner.       Influenza A by PCR POSITIVE (A) NEGATIVE Final   Influenza B by PCR NEGATIVE NEGATIVE Final    Comment: (NOTE) The Xpert Xpress SARS-CoV-2/FLU/RSV plus assay is intended as an aid in the diagnosis of influenza from Nasopharyngeal swab specimens and should not be used as a sole basis for treatment. Nasal washings and aspirates are unacceptable for Xpert Xpress SARS-CoV-2/FLU/RSV testing.  Fact Sheet for Patients: EntrepreneurPulse.com.au  Fact Sheet for Healthcare Providers: IncredibleEmployment.be  This test is not yet approved or cleared by the [place] FDA and has been authorized for detection and/or diagnosis of SARS-CoV-2 by FDA under an Emergency Use Authorization (EUA). This EUA will remain in effect (meaning this test can be used) for the duration of the COVID-19 declaration under Section 564(b)(1) of the Act, 21 U.S.C. section 360bbb-3(b)(1), unless the authorization is terminated or revoked.  Performed at [facility], [place] [address] [place], [place] [address]   Resp Panel by RT-PCR (Flu A&B, Covid) Urine, Clean Catch     Status: Abnormal   Collection Time: 07/27/21  5:46 AM   Specimen: Urine, Clean Catch; Nasopharyngeal(NP) swabs in vial transport medium  Result Value Ref Range Status   SARS Coronavirus 2 by RT PCR POSITIVE (A) NEGATIVE Final    Comment: RESULT CALLED TO, READ BACK BY AND VERIFIED WITH: RN C COBB [id number] AT 723 AM BY CM (NOTE) SARS-CoV-2 target nucleic acids are DETECTED.  The SARS-CoV-2 RNA is generally detectable in upper respiratory specimens during the acute phase of infection. Positive results are indicative of the presence of the identified virus, but do not  rule out bacterial infection or co-infection with other pathogens not detected by the test. Clinical correlation with patient history and other diagnostic information is necessary to determine patient infection status. The expected  result is Negative.  Fact Sheet for Patients: EntrepreneurPulse.com.au  Fact Sheet for Healthcare Providers: IncredibleEmployment.be  This test is not yet approved or cleared by the [place] FDA and  has been authorized for detection and/or diagnosis of SARS-CoV-2 by FDA under an Emergency Use Authorization (EUA).  This EUA will remain in effect (meaning this test can be  used) for the duration of  the COVID-19 declaration under Section 564(b)(1) of the Act, 21 U.S.C. section 360bbb-3(b)(1), unless the authorization is terminated or revoked sooner.     Influenza A by PCR POSITIVE (A) NEGATIVE Final   Influenza B by PCR NEGATIVE NEGATIVE Final    Comment: (NOTE) The Xpert Xpress SARS-CoV-2/FLU/RSV plus assay is intended as an aid in the diagnosis of influenza from Nasopharyngeal swab specimens and should not be used as a sole basis for treatment. Nasal washings and aspirates are unacceptable for Xpert Xpress SARS-CoV-2/FLU/RSV testing.  Fact Sheet for Patients: EntrepreneurPulse.com.au  Fact Sheet for Healthcare Providers: IncredibleEmployment.be  This test is not yet approved or cleared by the [place] FDA and has been authorized for detection and/or diagnosis of SARS-CoV-2 by FDA under an Emergency Use Authorization (EUA). This EUA will remain in effect (meaning this test can be used) for the duration of the COVID-19 declaration under Section 564(b)(1) of the Act, 21 U.S.C. section 360bbb-3(b)(1), unless the authorization is terminated or revoked.  Performed at [facility], [place] [address] [place], [place] [address]      Medications:    vitamin C  500  mg Oral Daily   buPROPion  300 mg Oral Daily   docusate sodium  100 mg Oral BID   enoxaparin (LOVENOX) injection  40 mg Subcutaneous Q24H   escitalopram  20 mg Oral Daily   insulin glargine-yfgn  25 Units Subcutaneous Daily   norgestimate-ethinyl estradiol  1 tablet Oral QPM   prazosin  2 mg Oral QHS   sodium chloride flush  3 mL Intravenous Q12H   sodium chloride flush  3 mL Intravenous Q12H   zinc sulfate  220 mg Oral Daily   Continuous Infusions:  sodium chloride     dextrose 5% lactated ringers 125 mL/hr at 07/28/21 0500   insulin 1 Units/hr (07/28/21 0604)   lactated ringers Stopped (07/27/21 1027)   potassium chloride 10 mEq (07/28/21 0645)      LOS: 1 day   Charlynne Cousins  [facility]  07/28/2021, 7:03 AM

## 2021-07-28 NOTE — ED Notes (Signed)
Pt had episode of vomiting after eating breakfast

## 2021-07-28 NOTE — Plan of Care (Signed)
  Problem: Education: Goal: Knowledge of General Education information will improve Description Including pain rating scale, medication(s)/side effects and non-pharmacologic comfort measures Outcome: Progressing   

## 2021-07-28 NOTE — Progress Notes (Signed)
New Admission Note:   Arrival Method: stretcher  Mental Orientation: alert and oriented  Telemetry: none  Assessment: Completed Skin: intact  RT:MYTRZ upper arm and left forearm  Pain: 0/10  Tubes: none  Safety Measures: Safety Fall Prevention Plan has been given, discussed and signed Admission: Completed [facility] Orientation: Patient has been orientated to the room, unit and staff.  Family: mother   Orders have been reviewed and implemented. Will continue to monitor the patient. Call light has been placed within reach and bed alarm has been activated.   Bentli Llorente RN [facility] Phone: [phone]

## 2021-07-29 LAB — CBC WITH DIFFERENTIAL/PLATELET
Abs Immature Granulocytes: 0 10*3/uL (ref 0.00–0.07)
Basophils Absolute: 0 10*3/uL (ref 0.0–0.1)
Basophils Relative: 0 %
Eosinophils Absolute: 0 10*3/uL (ref 0.0–0.5)
Eosinophils Relative: 1 %
HCT: 34.4 % — ABNORMAL LOW (ref 36.0–46.0)
Hemoglobin: 11.8 g/dL — ABNORMAL LOW (ref 12.0–15.0)
Lymphocytes Relative: 64 %
Lymphs Abs: 2.2 10*3/uL (ref 0.7–4.0)
MCH: 31.2 pg (ref 26.0–34.0)
MCHC: 34.3 g/dL (ref 30.0–36.0)
MCV: 91 fL (ref 80.0–100.0)
Monocytes Absolute: 0.2 10*3/uL (ref 0.1–1.0)
Monocytes Relative: 6 %
Neutro Abs: 1 10*3/uL — ABNORMAL LOW (ref 1.7–7.7)
Neutrophils Relative %: 29 %
Platelets: 162 10*3/uL (ref 150–400)
RBC: 3.78 MIL/uL — ABNORMAL LOW (ref 3.87–5.11)
RDW: 12.6 % (ref 11.5–15.5)
WBC: 3.4 10*3/uL — ABNORMAL LOW (ref 4.0–10.5)
nRBC: 0 % (ref 0.0–0.2)
nRBC: 1 /100 WBC — ABNORMAL HIGH

## 2021-07-29 LAB — BASIC METABOLIC PANEL
Anion gap: 11 (ref 5–15)
BUN: <5 mg/dL — ABNORMAL LOW (ref 6–20)
CO2: 27 mmol/L (ref 22–32)
Calcium: 8.5 mg/dL — ABNORMAL LOW (ref 8.9–10.3)
Chloride: 102 mmol/L (ref 98–111)
Creatinine, Ser: 0.58 mg/dL (ref 0.44–1.00)
GFR, Estimated: >60 mL/min (ref >60)
Glucose, Bld: 188 mg/dL — ABNORMAL HIGH (ref 70–99)
Potassium: 3.2 mmol/L — ABNORMAL LOW (ref 3.5–5.1)
Sodium: 140 mmol/L (ref 135–145)

## 2021-07-29 LAB — MAGNESIUM
Magnesium: 1.4 mg/dL — ABNORMAL LOW (ref 1.7–2.4)
Magnesium: 1.4 mg/dL — ABNORMAL LOW (ref 1.7–2.4)

## 2021-07-29 LAB — GLUCOSE, CAPILLARY: Glucose-Capillary: 169 mg/dL — ABNORMAL HIGH (ref 70–99)

## 2021-07-29 LAB — FERRITIN: Ferritin: 110 ng/mL (ref 11–307)

## 2021-07-29 LAB — D-DIMER, QUANTITATIVE: D-Dimer, Quant: 0.72 ug/mL-FEU — ABNORMAL HIGH (ref 0.00–0.50)

## 2021-07-29 LAB — PHOSPHORUS: Phosphorus: 3.3 mg/dL (ref 2.5–4.6)

## 2021-07-29 LAB — C-REACTIVE PROTEIN: CRP: 0.6 mg/dL (ref <1.0)

## 2021-07-29 MED ORDER — MAGNESIUM SULFATE 2 GM/50ML IV SOLN
2.0000 g | Freq: Once | INTRAVENOUS | Status: AC
  Administered 2021-07-29: 2 g via INTRAVENOUS
  Filled 2021-07-29: qty 50

## 2021-07-29 MED ORDER — MAGNESIUM OXIDE -MG SUPPLEMENT 400 (240 MG) MG PO TABS: 400.0000 mg | ORAL_TABLET | Freq: Three times a day (TID) | ORAL | Status: DC

## 2021-07-29 MED ORDER — POTASSIUM CHLORIDE CRYS ER 20 MEQ PO TBCR
40.0000 meq | EXTENDED_RELEASE_TABLET | Freq: Two times a day (BID) | ORAL | Status: DC
  Administered 2021-07-29: 40 meq via ORAL
  Filled 2021-07-29: qty 2

## 2021-07-29 MED ORDER — MAGNESIUM OXIDE -MG SUPPLEMENT 400 (240 MG) MG PO TABS
400.0000 mg | ORAL_TABLET | Freq: Two times a day (BID) | ORAL | Status: DC
  Administered 2021-07-29: 400 mg via ORAL
  Filled 2021-07-29: qty 1

## 2021-07-29 NOTE — TOC Transition Note (Signed)
Transition of Care [facility]) - CM/SW Discharge Note   Patient Details  Name: Kelli Davis MRN: [id number] Date of Birth: 2000/10/29  Transition of Care [facility]) CM/SW Contact:  Tom-Johnson, Hershal Coria, RN Phone Number: 07/29/2021, 11:15 AM   Clinical Narrative:     Transition of Care (TOC) Screening Note   Patient Details  Name: Kelli Davis Date of Birth: [birth year]   Transition of Care [facility]) CM/SW Contact:    Tom-Johnson, Hershal Coria, RN Phone Number: 07/29/2021, 11:15 AM  Transition of Care Department (TOC) has reviewed patient and no TOC needs have been identified at this time. Patient is scheduled for discharge today. Mother at bedside and will transport.     Final next level of care: Home/Self Care Barriers to Discharge: Barriers Resolved   Patient Goals and CMS Choice Patient states their goals for this hospitalization and ongoing recovery are:: To go home CMS Medicare.gov Compare Post Acute Care list provided to:: Patient Choice offered to / list presented to : NA  Discharge Placement                       Discharge Plan and Services                DME Arranged: N/A DME Agency: NA         HH Agency: NA        Social Determinants of Health (SDOH) Interventions     Readmission Risk Interventions No flowsheet data found.

## 2021-07-29 NOTE — Discharge Summary (Signed)
Physician Discharge Summary  Kelli Davis [id number] DOB: 15-Feb-2001 DOA: 07/27/2021  PCP: [name], FNP  Admit date: 07/27/2021 Discharge date: 07/29/2021  Admitted From: home Disposition:  home  Recommendations for Outpatient Follow-up:  Follow up with PCP in 1-2 weeks Please obtain BMP/CBC in one week Home Health:No Equipment/Devices:none  Discharge Condition:stable CODE STATUS:Full Diet recommendation: Heart Healthy  Brief/Interim Summary: 20 y.o. female past medical history significant for ADHD, type 1 diabetes mellitus with multiple admissions for DKA, recently discharged from the hospital on 07/22/2021 for DKA in the setting of influenza A, while 2 days at home she started having a productive cough came back to the ED was found in DKA  Discharge Diagnoses:  Principal Problem:   DKA (diabetic ketoacidosis) ([place]) Active Problems:   Adjustment disorder with mixed anxiety and depressed mood   Influenza A   COVID-19 virus infection   AKI (acute kidney injury) ([place])  DKA/diabetes mellitus type 2: He was started on IV insulin drip once her bicarb is closed and her bicarbonate was greater than 20 she was changed to her home regimen of long-acting insulin per sliding scale she was tolerating her diet.  Is likely due to COVID-19 viral infection see below for the details.  Acute kidney injury: In the setting of DKA, prerenal azotemia resolved with IV fluid hydration.  COVID-19 viral infection call Probably precipitated DKA, chest x-ray showed no acute infiltrate CRP was just mildly elevated her saturations remained greater 90% with ambulation. She was tolerating her diet. She has been instructed to quarantine for 5 days.  Electrolyte imbalance/hypokalemia/hypomagnesemia: These were repleted orally now resolved.  X  History of influenza A: She completed her treatment on her last admission.  Mood disorder: No changes made to her medication.  Discharge  Instructions  Discharge Instructions     Diet - low sodium heart healthy   Complete by: As directed    Increase activity slowly   Complete by: As directed       Allergies as of 07/29/2021       Reactions   Penicillins Hives        Medication List     TAKE these medications    Accu-Chek FastClix Lancets Misc Check sugar 10 x daily   Baqsimi One Pack 3 MG/DOSE Powd Generic drug: Glucagon Place 1 Dose into the nose as needed. What changed: reasons to take this   BD Pen Needle Nano U/F 32G X 4 MM Misc Generic drug: Insulin Pen Needle Use with insulin pens 6x daily   buPROPion 300 MG 24 hr tablet Commonly known as: WELLBUTRIN XL TAKE 1 TABLET BY MOUTH EVERY DAY What changed: how to take this   Dexcom G6 Sensor Misc 1 APPLICATION BY DOES NOT APPLY ROUTE AS NEEDED.   Dexcom G6 Transmitter Misc 1 application by Does not apply route continuous as needed.   escitalopram 20 MG tablet Commonly known as: Lexapro Take 1 tablet (20 mg total) by mouth daily.   fluticasone 50 MCG/ACT nasal spray Commonly known as: FLONASE 1 spray daily as needed for allergies.   glucose blood test strip Use as instructed   hydrOXYzine 10 MG tablet Commonly known as: ATARAX Take 1 tablet (10 mg total) by mouth 3 (three) times daily as needed. What changed: reasons to take this   ibuprofen 200 MG tablet Commonly known as: ADVIL Take 400 mg by mouth every 6 (six) hours as needed for headache, mild pain or cramping.   norgestimate-ethinyl estradiol 0.25-35 MG-MCG  tablet Commonly known as: Sprintec 28 TAKE 1 TABLET DAILY. DISCARD PLACEBOS AND TAKE ACTIVE PILLS FOR CONTINUOUS CYCLING What changed:  how much to take how to take this when to take this additional instructions   NovoLOG FlexPen 100 UNIT/ML FlexPen Generic drug: insulin aspart INJECT UP TO 50 UNITS SUBCUTANEOUSLY DAILY What changed:  how much to take when to take this additional instructions   ondansetron 8 MG  tablet Commonly known as: Zofran Take 1 tablet (8 mg total) by mouth every 8 (eight) hours as needed for nausea or vomiting.   prazosin 2 MG capsule Commonly known as: MINIPRESS Take 1 capsule (2 mg total) by mouth at bedtime.   Tyler Aas FlexTouch 100 UNIT/ML FlexTouch Pen Generic drug: insulin degludec Inject 30 Units into the skin at bedtime. Inject up to 50 units per day SubQ What changed: additional instructions        Allergies  Allergen Reactions   Penicillins Hives    Consultations: None   Procedures/Studies: DG CHEST PORT 1 VIEW  Result Date: 07/27/2021 CLINICAL DATA:  COVID, nausea, vomiting, diabetes EXAM: PORTABLE CHEST 1 VIEW COMPARISON:  None. FINDINGS: The heart size and mediastinal contours are within normal limits. Both lungs are clear. The visualized skeletal structures are unremarkable. IMPRESSION: No acute abnormality of the lungs in AP portable projection. Electronically Signed   By: Delanna Ahmadi M.D.   On: 07/27/2021 09:11   (Echo, Carotid, EGD, Colonoscopy, ERCP)    Subjective: No complaints  Discharge Exam: Vitals:   07/29/21 0158 07/29/21 0526  BP: 96/64 101/61  Pulse: 67 74  Resp: 20 18  Temp: 98.8 F (37.1 C) 97.6 F (36.4 C)  SpO2: 95% 98%   Vitals:   07/28/21 1855 07/28/21 2147 07/29/21 0158 07/29/21 0526  BP: 102/61 103/71 96/64 101/61  Pulse: 94 71 67 74  Resp: 14 20 20 18   Temp: 98.7 F (37.1 C) 98.4 F (36.9 C) 98.8 F (37.1 C) 97.6 F (36.4 C)  TempSrc: Oral Oral Oral Oral  SpO2: 98% 98% 95% 98%  Weight:      Height:        General: Pt is alert, awake, not in acute distress Cardiovascular: RRR, S1/S2 +, no rubs, no gallops Respiratory: CTA bilaterally, no wheezing, no rhonchi Abdominal: Soft, NT, ND, bowel sounds + Extremities: no edema, no cyanosis    The results of significant diagnostics from this hospitalization (including imaging, microbiology, ancillary and laboratory) are listed below for reference.      Microbiology: Recent Results (from the past 240 hour(s))  Resp Panel by RT-PCR (Flu A&B, Covid) Nasopharyngeal Swab     Status: Abnormal   Collection Time: 07/21/21  1:21 PM   Specimen: Nasopharyngeal Swab; Nasopharyngeal(NP) swabs in vial transport medium  Result Value Ref Range Status   SARS Coronavirus 2 by RT PCR NEGATIVE NEGATIVE Final    Comment: (NOTE) SARS-CoV-2 target nucleic acids are NOT DETECTED.  The SARS-CoV-2 RNA is generally detectable in upper respiratory specimens during the acute phase of infection. The lowest concentration of SARS-CoV-2 viral copies this assay can detect is 138 copies/mL. A negative result does not preclude SARS-Cov-2 infection and should not be used as the sole basis for treatment or other patient management decisions. A negative result may occur with  improper specimen collection/handling, submission of specimen other than nasopharyngeal swab, presence of viral mutation(s) within the areas targeted by this assay, and inadequate number of viral copies(<138 copies/mL). A negative result must be combined with clinical observations,  patient history, and epidemiological information. The expected result is Negative.  Fact Sheet for Patients:  EntrepreneurPulse.com.au  Fact Sheet for Healthcare Providers:  IncredibleEmployment.be  This test is no t yet approved or cleared by the [place] FDA and  has been authorized for detection and/or diagnosis of SARS-CoV-2 by FDA under an Emergency Use Authorization (EUA). This EUA will remain  in effect (meaning this test can be used) for the duration of the COVID-19 declaration under Section 564(b)(1) of the Act, 21 U.S.C.section 360bbb-3(b)(1), unless the authorization is terminated  or revoked sooner.       Influenza A by PCR POSITIVE (A) NEGATIVE Final   Influenza B by PCR NEGATIVE NEGATIVE Final    Comment: (NOTE) The Xpert Xpress SARS-CoV-2/FLU/RSV plus  assay is intended as an aid in the diagnosis of influenza from Nasopharyngeal swab specimens and should not be used as a sole basis for treatment. Nasal washings and aspirates are unacceptable for Xpert Xpress SARS-CoV-2/FLU/RSV testing.  Fact Sheet for Patients: EntrepreneurPulse.com.au  Fact Sheet for Healthcare Providers: IncredibleEmployment.be  This test is not yet approved or cleared by the [place] FDA and has been authorized for detection and/or diagnosis of SARS-CoV-2 by FDA under an Emergency Use Authorization (EUA). This EUA will remain in effect (meaning this test can be used) for the duration of the COVID-19 declaration under Section 564(b)(1) of the Act, 21 U.S.C. section 360bbb-3(b)(1), unless the authorization is terminated or revoked.  Performed at [facility], [place] [address], [place], [place] [address]   Resp Panel by RT-PCR (Flu A&B, Covid) Urine, Clean Catch     Status: Abnormal   Collection Time: 07/27/21  5:46 AM   Specimen: Urine, Clean Catch; Nasopharyngeal(NP) swabs in vial transport medium  Result Value Ref Range Status   SARS Coronavirus 2 by RT PCR POSITIVE (A) NEGATIVE Final    Comment: RESULT CALLED TO, READ BACK BY AND VERIFIED WITH: RN C COBB [id number] AT 723 AM BY CM (NOTE) SARS-CoV-2 target nucleic acids are DETECTED.  The SARS-CoV-2 RNA is generally detectable in upper respiratory specimens during the acute phase of infection. Positive results are indicative of the presence of the identified virus, but do not rule out bacterial infection or co-infection with other pathogens not detected by the test. Clinical correlation with patient history and other diagnostic information is necessary to determine patient infection status. The expected result is Negative.  Fact Sheet for Patients: EntrepreneurPulse.com.au  Fact Sheet for Healthcare  Providers: IncredibleEmployment.be  This test is not yet approved or cleared by the [place] FDA and  has been authorized for detection and/or diagnosis of SARS-CoV-2 by FDA under an Emergency Use Authorization (EUA).  This EUA will remain in effect (meaning this test can be  used) for the duration of  the COVID-19 declaration under Section 564(b)(1) of the Act, 21 U.S.C. section 360bbb-3(b)(1), unless the authorization is terminated or revoked sooner.     Influenza A by PCR POSITIVE (A) NEGATIVE Final   Influenza B by PCR NEGATIVE NEGATIVE Final    Comment: (NOTE) The Xpert Xpress SARS-CoV-2/FLU/RSV plus assay is intended as an aid in the diagnosis of influenza from Nasopharyngeal swab specimens and should not be used as a sole basis for treatment. Nasal washings and aspirates are unacceptable for Xpert Xpress SARS-CoV-2/FLU/RSV testing.  Fact Sheet for Patients: EntrepreneurPulse.com.au  Fact Sheet for Healthcare Providers: IncredibleEmployment.be  This test is not yet approved or cleared by the [place] and has been authorized  for detection and/or diagnosis of SARS-CoV-2 by FDA under an Emergency Use Authorization (EUA). This EUA will remain in effect (meaning this test can be used) for the duration of the COVID-19 declaration under Section 564(b)(1) of the Act, 21 U.S.C. section 360bbb-3(b)(1), unless the authorization is terminated or revoked.  Performed at [facility], [place] [address] [place], [place] [address]      Labs: BNP (last 3 results) No results for input(s): BNP in the last 8760 hours. Basic Metabolic Panel: Recent Labs  Lab 07/27/21 0951 07/27/21 1225 07/27/21 1625 07/28/21 0258 07/29/21 0659  NA 134* 133* 135 135 140  K 4.7 4.4 3.6 2.7* 3.2*  CL 104 104 108 104 102  CO2 9* 9* 16* 22 27  GLUCOSE 244* 248* 174* 261* 188*  BUN 9 6 <5* <5* <5*  CREATININE 1.29* 1.12* 0.93  0.78 0.58  CALCIUM 8.3* 8.6* 8.0* 7.5* 8.5*  MG  --   --   --  1.3* 1.4*   1.4*  PHOS  --   --   --  2.2* 3.3   Liver Function Tests: Recent Labs  Lab 07/27/21 0549  AST 22  ALT 24  ALKPHOS 99  BILITOT 1.9*  PROT 8.0  ALBUMIN 4.0   Recent Labs  Lab 07/27/21 0549  LIPASE 85*   No results for input(s): AMMONIA in the last 168 hours. CBC: Recent Labs  Lab 07/27/21 0549 07/27/21 0637 07/27/21 0644 07/28/21 1418 07/29/21 0659  WBC 5.4  --   --  4.3 3.4*  NEUTROABS 4.2  --   --  2.2 1.0*  HGB 17.0* 17.3* 17.0* 12.9 11.8*  HCT 51.6* 51.0* 50.0* 35.6* 34.4*  MCV 95.7  --   --  88.8 91.0  PLT 302  --   --  195 162   Cardiac Enzymes: No results for input(s): CKTOTAL, CKMB, CKMBINDEX, TROPONINI in the last 168 hours. BNP: Invalid input(s): POCBNP CBG: Recent Labs  Lab 07/28/21 0834 07/28/21 1151 07/28/21 1709 07/28/21 2158 07/29/21 0645  GLUCAP 241* 186* 175* 170* 169*   D-Dimer Recent Labs    07/28/21 0258 07/29/21 0659  DDIMER 0.51* 0.72*   Hgb A1c No results for input(s): HGBA1C in the last 72 hours. Lipid Profile No results for input(s): CHOL, HDL, LDLCALC, TRIG, CHOLHDL, LDLDIRECT in the last 72 hours. Thyroid function studies No results for input(s): TSH, T4TOTAL, T3FREE, THYROIDAB in the last 72 hours.  Invalid input(s): FREET3 Anemia work up Recent Labs    07/27/21 0951 07/28/21 0258  FERRITIN 128 140   Urinalysis    Component Value Date/Time   COLORURINE YELLOW 07/27/2021 [place] 07/27/2021 0544   LABSPEC >1.030 (H) 07/27/2021 0544   PHURINE 5.5 07/27/2021 0544   GLUCOSEU >=500 (A) 07/27/2021 0544   HGBUR NEGATIVE 07/27/2021 0544   BILIRUBINUR SMALL (A) 07/27/2021 0544   BILIRUBINUR neg 05/25/2021 0956   KETONESUR >80 (A) 07/27/2021 0544   PROTEINUR NEGATIVE 07/27/2021 0544   UROBILINOGEN negative (A) 05/25/2021 0956   UROBILINOGEN 0.2 08/16/2011 0330   NITRITE NEGATIVE 07/27/2021 0544   LEUKOCYTESUR NEGATIVE  07/27/2021 0544   Sepsis Labs Invalid input(s): PROCALCITONIN,  WBC,  LACTICIDVEN Microbiology Recent Results (from the past 240 hour(s))  Resp Panel by RT-PCR (Flu A&B, Covid) Nasopharyngeal Swab     Status: Abnormal   Collection Time: 07/21/21  1:21 PM   Specimen: Nasopharyngeal Swab; Nasopharyngeal(NP) swabs in vial transport medium  Result Value Ref Range Status   SARS  Coronavirus 2 by RT PCR NEGATIVE NEGATIVE Final    Comment: (NOTE) SARS-CoV-2 target nucleic acids are NOT DETECTED.  The SARS-CoV-2 RNA is generally detectable in upper respiratory specimens during the acute phase of infection. The lowest concentration of SARS-CoV-2 viral copies this assay can detect is 138 copies/mL. A negative result does not preclude SARS-Cov-2 infection and should not be used as the sole basis for treatment or other patient management decisions. A negative result may occur with  improper specimen collection/handling, submission of specimen other than nasopharyngeal swab, presence of viral mutation(s) within the areas targeted by this assay, and inadequate number of viral copies(<138 copies/mL). A negative result must be combined with clinical observations, patient history, and epidemiological information. The expected result is Negative.  Fact Sheet for Patients:  BloggerCourse.com  Fact Sheet for Healthcare Providers:  SeriousBroker.it  This test is no t yet approved or cleared by the [place] FDA and  has been authorized for detection and/or diagnosis of SARS-CoV-2 by FDA under an Emergency Use Authorization (EUA). This EUA will remain  in effect (meaning this test can be used) for the duration of the COVID-19 declaration under Section 564(b)(1) of the Act, 21 U.S.C.section 360bbb-3(b)(1), unless the authorization is terminated  or revoked sooner.       Influenza A by PCR POSITIVE (A) NEGATIVE Final   Influenza B by PCR  NEGATIVE NEGATIVE Final    Comment: (NOTE) The Xpert Xpress SARS-CoV-2/FLU/RSV plus assay is intended as an aid in the diagnosis of influenza from Nasopharyngeal swab specimens and should not be used as a sole basis for treatment. Nasal washings and aspirates are unacceptable for Xpert Xpress SARS-CoV-2/FLU/RSV testing.  Fact Sheet for Patients: BloggerCourse.com  Fact Sheet for Healthcare Providers: SeriousBroker.it  This test is not yet approved or cleared by the [place] FDA and has been authorized for detection and/or diagnosis of SARS-CoV-2 by FDA under an Emergency Use Authorization (EUA). This EUA will remain in effect (meaning this test can be used) for the duration of the COVID-19 declaration under Section 564(b)(1) of the Act, 21 U.S.C. section 360bbb-3(b)(1), unless the authorization is terminated or revoked.  Performed at [facility], [address], [place], [place] [address]   Resp Panel by RT-PCR (Flu A&B, Covid) Urine, Clean Catch     Status: Abnormal   Collection Time: 07/27/21  5:46 AM   Specimen: Urine, Clean Catch; Nasopharyngeal(NP) swabs in vial transport medium  Result Value Ref Range Status   SARS Coronavirus 2 by RT PCR POSITIVE (A) NEGATIVE Final    Comment: RESULT CALLED TO, READ BACK BY AND VERIFIED WITH: RN C COBB [id number] AT 723 AM BY CM (NOTE) SARS-CoV-2 target nucleic acids are DETECTED.  The SARS-CoV-2 RNA is generally detectable in upper respiratory specimens during the acute phase of infection. Positive results are indicative of the presence of the identified virus, but do not rule out bacterial infection or co-infection with other pathogens not detected by the test. Clinical correlation with patient history and other diagnostic information is necessary to determine patient infection status. The expected result is Negative.  Fact Sheet for  Patients: BloggerCourse.com  Fact Sheet for Healthcare Providers: SeriousBroker.it  This test is not yet approved or cleared by the [place] FDA and  has been authorized for detection and/or diagnosis of SARS-CoV-2 by FDA under an Emergency Use Authorization (EUA).  This EUA will remain in effect (meaning this test can be  used) for the duration of  the  COVID-19 declaration under Section 564(b)(1) of the Act, 21 U.S.C. section 360bbb-3(b)(1), unless the authorization is terminated or revoked sooner.     Influenza A by PCR POSITIVE (A) NEGATIVE Final   Influenza B by PCR NEGATIVE NEGATIVE Final    Comment: (NOTE) The Xpert Xpress SARS-CoV-2/FLU/RSV plus assay is intended as an aid in the diagnosis of influenza from Nasopharyngeal swab specimens and should not be used as a sole basis for treatment. Nasal washings and aspirates are unacceptable for Xpert Xpress SARS-CoV-2/FLU/RSV testing.  Fact Sheet for Patients: EntrepreneurPulse.com.au  Fact Sheet for Healthcare Providers: IncredibleEmployment.be  This test is not yet approved or cleared by the [place] FDA and has been authorized for detection and/or diagnosis of SARS-CoV-2 by FDA under an Emergency Use Authorization (EUA). This EUA will remain in effect (meaning this test can be used) for the duration of the COVID-19 declaration under Section 564(b)(1) of the Act, 21 U.S.C. section 360bbb-3(b)(1), unless the authorization is terminated or revoked.  Performed at [facility], [place] [address], [place], [place] [address]     SIGNED:   Charlynne Cousins, MD  [facility] 07/29/2021, 8:53 AM Pager   If 7PM-7AM, please contact night-coverage [email or web address] Password [email or web address]

## 2021-07-29 NOTE — Progress Notes (Signed)
DISCHARGE NOTE HOME Glorianne Obey to be discharged Home per MD order. Discussed prescriptions and follow up appointments with the patient. Prescriptions given to patient; medication list explained in detail. Patient verbalized understanding.  Skin clean, dry and intact without evidence of skin break down, no evidence of skin tears noted. IV catheter discontinued intact. Site without signs and symptoms of complications. Dressing and pressure applied. Pt denies pain at the site currently. No complaints noted.  Patient free of lines, drains, and wounds.   An After Visit Summary (AVS) was printed and given to the patient. Patient escorted via wheelchair, and discharged home via private auto.  Myrtis Hopping, RN

## 2021-07-30 LAB — PATHOLOGIST SMEAR REVIEW

## 2021-08-05 ENCOUNTER — Telehealth (INDEPENDENT_AMBULATORY_CARE_PROVIDER_SITE_OTHER): Payer: Self-pay

## 2021-08-05 ENCOUNTER — Ambulatory Visit: Payer: BC Managed Care – PPO | Admitting: Pediatrics

## 2021-08-05 NOTE — Telephone Encounter (Signed)
Called patient to schedule appointment since she missed her last one with [name].

## 2021-08-09 ENCOUNTER — Inpatient Hospital Stay (HOSPITAL_COMMUNITY)
Admission: EM | Admit: 2021-08-09 | Discharge: 2021-08-11 | DRG: 638 | Disposition: A | Discharge status: Home / Self Care | Payer: BC Managed Care – PPO | Attending: Family Medicine | Admitting: Family Medicine

## 2021-08-09 ENCOUNTER — Other Ambulatory Visit: Payer: Self-pay

## 2021-08-09 ENCOUNTER — Encounter (HOSPITAL_COMMUNITY): Payer: Self-pay | Admitting: Internal Medicine

## 2021-08-09 ENCOUNTER — Emergency Department (HOSPITAL_COMMUNITY): Payer: BC Managed Care – PPO

## 2021-08-09 DIAGNOSIS — E1110 Type 2 diabetes mellitus with ketoacidosis without coma: Secondary | ICD-10-CM | POA: Diagnosis present

## 2021-08-09 DIAGNOSIS — Z8619 Personal history of other infectious and parasitic diseases: Secondary | ICD-10-CM | POA: Diagnosis not present

## 2021-08-09 DIAGNOSIS — Z8616 Personal history of COVID-19: Secondary | ICD-10-CM

## 2021-08-09 DIAGNOSIS — E876 Hypokalemia: Secondary | ICD-10-CM | POA: Diagnosis present

## 2021-08-09 DIAGNOSIS — Z880 Allergy status to penicillin: Secondary | ICD-10-CM | POA: Diagnosis not present

## 2021-08-09 DIAGNOSIS — B3781 Candidal esophagitis: Secondary | ICD-10-CM | POA: Diagnosis present

## 2021-08-09 DIAGNOSIS — K219 Gastro-esophageal reflux disease without esophagitis: Secondary | ICD-10-CM | POA: Diagnosis not present

## 2021-08-09 DIAGNOSIS — Z833 Family history of diabetes mellitus: Secondary | ICD-10-CM | POA: Diagnosis not present

## 2021-08-09 DIAGNOSIS — F32A Depression, unspecified: Secondary | ICD-10-CM | POA: Diagnosis not present

## 2021-08-09 DIAGNOSIS — R079 Chest pain, unspecified: Secondary | ICD-10-CM | POA: Diagnosis not present

## 2021-08-09 DIAGNOSIS — Z793 Long term (current) use of hormonal contraceptives: Secondary | ICD-10-CM | POA: Diagnosis not present

## 2021-08-09 DIAGNOSIS — Z91199 Patient's noncompliance with other medical treatment and regimen due to unspecified reason: Secondary | ICD-10-CM | POA: Diagnosis not present

## 2021-08-09 DIAGNOSIS — N179 Acute kidney failure, unspecified: Secondary | ICD-10-CM | POA: Diagnosis not present

## 2021-08-09 DIAGNOSIS — Z794 Long term (current) use of insulin: Secondary | ICD-10-CM

## 2021-08-09 DIAGNOSIS — Z79899 Other long term (current) drug therapy: Secondary | ICD-10-CM | POA: Diagnosis not present

## 2021-08-09 DIAGNOSIS — E1010 Type 1 diabetes mellitus with ketoacidosis without coma: Secondary | ICD-10-CM | POA: Diagnosis not present

## 2021-08-09 DIAGNOSIS — R000 Tachycardia, unspecified: Secondary | ICD-10-CM | POA: Diagnosis not present

## 2021-08-09 DIAGNOSIS — E860 Dehydration: Secondary | ICD-10-CM | POA: Diagnosis not present

## 2021-08-09 DIAGNOSIS — F419 Anxiety disorder, unspecified: Secondary | ICD-10-CM | POA: Diagnosis not present

## 2021-08-09 DIAGNOSIS — Z8249 Family history of ischemic heart disease and other diseases of the circulatory system: Secondary | ICD-10-CM | POA: Diagnosis not present

## 2021-08-09 LAB — URINALYSIS, MICROSCOPIC (REFLEX): RBC / HPF: NONE SEEN RBC/hpf (ref 0–5)

## 2021-08-09 LAB — CBC
HCT: 48.3 % — ABNORMAL HIGH (ref 36.0–46.0)
Hemoglobin: 17 g/dL — ABNORMAL HIGH (ref 12.0–15.0)
MCH: 33 pg (ref 26.0–34.0)
MCHC: 35.2 g/dL (ref 30.0–36.0)
MCV: 93.8 fL (ref 80.0–100.0)
Platelets: 291 10*3/uL (ref 150–400)
RBC: 5.15 MIL/uL — ABNORMAL HIGH (ref 3.87–5.11)
RDW: 13 % (ref 11.5–15.5)
WBC: 4.7 10*3/uL (ref 4.0–10.5)
nRBC: 0 % (ref 0.0–0.2)

## 2021-08-09 LAB — BASIC METABOLIC PANEL
Anion gap: 14 (ref 5–15)
BUN: 9 mg/dL (ref 6–20)
CO2: 13 mmol/L — ABNORMAL LOW (ref 22–32)
Calcium: 7.7 mg/dL — ABNORMAL LOW (ref 8.9–10.3)
Chloride: 106 mmol/L (ref 98–111)
Creatinine, Ser: 0.68 mg/dL (ref 0.44–1.00)
GFR, Estimated: >60 mL/min (ref >60)
Glucose, Bld: 105 mg/dL — ABNORMAL HIGH (ref 70–99)
Potassium: 4.2 mmol/L (ref 3.5–5.1)
Sodium: 133 mmol/L — ABNORMAL LOW (ref 135–145)

## 2021-08-09 LAB — URINALYSIS, ROUTINE W REFLEX MICROSCOPIC
Bilirubin Urine: NEGATIVE
Glucose, UA: >=500 mg/dL — AB
Ketones, ur: >80 mg/dL — AB
Leukocytes,Ua: NEGATIVE
Nitrite: NEGATIVE
Specific Gravity, Urine: >1.03 — ABNORMAL HIGH (ref 1.005–1.030)
pH: 5.5 (ref 5.0–8.0)

## 2021-08-09 LAB — COMPREHENSIVE METABOLIC PANEL
ALT: 55 U/L — ABNORMAL HIGH (ref 0–44)
AST: 29 U/L (ref 15–41)
Albumin: 4.6 g/dL (ref 3.5–5.0)
Alkaline Phosphatase: 114 U/L (ref 38–126)
Anion gap: 28 — ABNORMAL HIGH (ref 5–15)
BUN: 13 mg/dL (ref 6–20)
CO2: 8 mmol/L — ABNORMAL LOW (ref 22–32)
Calcium: 8.9 mg/dL (ref 8.9–10.3)
Chloride: 95 mmol/L — ABNORMAL LOW (ref 98–111)
Creatinine, Ser: 1.24 mg/dL — ABNORMAL HIGH (ref 0.44–1.00)
GFR, Estimated: >60 mL/min (ref >60)
Glucose, Bld: 424 mg/dL — ABNORMAL HIGH (ref 70–99)
Potassium: 3.2 mmol/L — ABNORMAL LOW (ref 3.5–5.1)
Sodium: 131 mmol/L — ABNORMAL LOW (ref 135–145)
Total Bilirubin: 2.2 mg/dL — ABNORMAL HIGH (ref 0.3–1.2)
Total Protein: 8.5 g/dL — ABNORMAL HIGH (ref 6.5–8.1)

## 2021-08-09 LAB — BLOOD GAS, VENOUS
Acid-base deficit: 18.9 mmol/L — ABNORMAL HIGH (ref 0.0–2.0)
Bicarbonate: 10.5 mmol/L — ABNORMAL LOW (ref 20.0–28.0)
Drawn by: 6352
FIO2: 21
O2 Saturation: 76.6 %
Patient temperature: 36.3
pCO2, Ven: 24.6 mmHg — ABNORMAL LOW (ref 44.0–60.0)
pH, Ven: 7.153 — CL (ref 7.250–7.430)
pO2, Ven: 47.5 mmHg — ABNORMAL HIGH (ref 32.0–45.0)

## 2021-08-09 LAB — GLUCOSE, CAPILLARY
Glucose-Capillary: 124 mg/dL — ABNORMAL HIGH (ref 70–99)
Glucose-Capillary: 140 mg/dL — ABNORMAL HIGH (ref 70–99)
Glucose-Capillary: 162 mg/dL — ABNORMAL HIGH (ref 70–99)

## 2021-08-09 LAB — CBG MONITORING, ED
Glucose-Capillary: >600 mg/dL (ref 70–99)
Glucose-Capillary: 124 mg/dL — ABNORMAL HIGH (ref 70–99)
Glucose-Capillary: 132 mg/dL — ABNORMAL HIGH (ref 70–99)

## 2021-08-09 LAB — BETA-HYDROXYBUTYRIC ACID
Beta-Hydroxybutyric Acid: >8 mmol/L — ABNORMAL HIGH (ref 0.05–0.27)
Beta-Hydroxybutyric Acid: 3.49 mmol/L — ABNORMAL HIGH (ref 0.05–0.27)

## 2021-08-09 LAB — POC URINE PREG, ED: Preg Test, Ur: NEGATIVE

## 2021-08-09 MED ORDER — CHLORHEXIDINE GLUCONATE CLOTH 2 % EX PADS
6.0000 | MEDICATED_PAD | Freq: Every day | CUTANEOUS | Status: DC
  Administered 2021-08-10 – 2021-08-11 (×2): 6 via TOPICAL

## 2021-08-09 MED ORDER — ENOXAPARIN SODIUM 40 MG/0.4ML IJ SOSY: 40.0000 mg | PREFILLED_SYRINGE | INTRAMUSCULAR | Status: DC

## 2021-08-09 MED ORDER — MAGIC MOUTHWASH W/LIDOCAINE
10.0000 mL | Freq: Three times a day (TID) | ORAL | Status: DC
  Administered 2021-08-09 – 2021-08-11 (×5): 10 mL via ORAL
  Filled 2021-08-09 (×8): qty 10

## 2021-08-09 MED ORDER — DEXTROSE IN LACTATED RINGERS 5 % IV SOLN: INTRAVENOUS | Status: DC

## 2021-08-09 MED ORDER — INSULIN REGULAR(HUMAN) IN NACL 100-0.9 UT/100ML-% IV SOLN
INTRAVENOUS | Status: DC
  Filled 2021-08-09 (×2): qty 100

## 2021-08-09 MED ORDER — ENOXAPARIN SODIUM 30 MG/0.3ML IJ SOSY: 30.0000 mg | PREFILLED_SYRINGE | INTRAMUSCULAR | Status: DC

## 2021-08-09 MED ORDER — DEXTROSE 50 % IV SOLN: 0.0000 mL | INTRAVENOUS | Status: DC | PRN

## 2021-08-09 MED ORDER — LACTATED RINGERS IV SOLN: INTRAVENOUS | Status: DC

## 2021-08-09 MED ORDER — LACTATED RINGERS IV BOLUS
1000.0000 mL | Freq: Once | INTRAVENOUS | Status: AC
  Administered 2021-08-09: 15:00:00 1000 mL via INTRAVENOUS

## 2021-08-09 MED ORDER — SODIUM CHLORIDE 0.9 % IV BOLUS
1000.0000 mL | Freq: Once | INTRAVENOUS | Status: AC
  Administered 2021-08-09: 17:00:00 1000 mL via INTRAVENOUS

## 2021-08-09 MED ORDER — POTASSIUM CHLORIDE 10 MEQ/100ML IV SOLN
10.0000 meq | INTRAVENOUS | Status: AC
  Administered 2021-08-09: 17:00:00 10 meq via INTRAVENOUS
  Filled 2021-08-09 (×3): qty 100

## 2021-08-09 MED ORDER — POTASSIUM CHLORIDE 10 MEQ/100ML IV SOLN
10.0000 meq | INTRAVENOUS | Status: AC
  Administered 2021-08-09 (×3): 10 meq via INTRAVENOUS
  Filled 2021-08-09: qty 100

## 2021-08-09 MED ORDER — LACTATED RINGERS IV BOLUS: 20.0000 mL/kg | Freq: Once | INTRAVENOUS | Status: DC

## 2021-08-09 MED ORDER — PANTOPRAZOLE SODIUM 40 MG PO TBEC
40.0000 mg | DELAYED_RELEASE_TABLET | Freq: Two times a day (BID) | ORAL | Status: DC
  Administered 2021-08-10 – 2021-08-11 (×3): 40 mg via ORAL
  Filled 2021-08-09 (×3): qty 1

## 2021-08-09 MED ORDER — INSULIN REGULAR(HUMAN) IN NACL 100-0.9 UT/100ML-% IV SOLN
INTRAVENOUS | Status: DC
  Administered 2021-08-09: 21:00:00 0.9 [IU]/h via INTRAVENOUS

## 2021-08-09 NOTE — ED Notes (Signed)
Pt is a hard stick. Ultrasound IV attempted. EDP aware and notified.

## 2021-08-09 NOTE — ED Triage Notes (Addendum)
Pt to ED c/o high blood sugar, hx: DM 1. Pt just discharged from hospital about 10 days ago for DKA, since discharge pt has had increased weakness, sleeping a lot which has resulted in non compliance with insulin regimen, dry mouth, no appetite. Symptoms progressively getting worse, also c/o chest ongoing chest pain. COVID positive 2 weeks ago.

## 2021-08-09 NOTE — ED Notes (Signed)
Hold endo tool and insulin at this time due to bs 124

## 2021-08-09 NOTE — H&P (Signed)
History and Physical    Kelli Davis IZT:[id number] DOB: 2001/08/12 DOA: 08/09/2021  PCP: [name], FNP  Patient coming from: Home  I have personally briefly reviewed patient's old medical records in [facility]  Chief Complaint: Generalized weakness  HPI: Kelli Davis is a 20 y.o. female with medical history significant of type 1 diabetes, recently tested positive for influenza A and COVID, was recently discharged from the hospital after being treated for DKA.  She reports that upon leaving, she continued to be increasingly weak and fatigued.  She was asleep for most of the day.  She was staying with her mom who was working for the day, and patient was left home alone for the majority of the day.  She reports sleeping throughout the day, and felt too weak to get up and take her medications or eat anything significant.  She reports taking her basal insulin approximately 2 times in the past week.  She was trying to take her NovoLog more regularly, but admits to taking without any food.  She did have some vomiting earlier today, but none earlier in the week.  She has not had any dysuria.  She did describe some discomfort with eating and drinking in her throat, chest and abdomen.  It is described as burning.  She has not had any shortness of breath.  She is following her blood sugars at home approximately once or twice a day and noted them to be anywhere from 200-400.  ED Course: She was evaluated in emergency room where she was noted to be in DKA with a serum bicarb of 8.  Blood gas showed pH of 7.15.  Patient was started on IV fluids and insulin infusion was ordered.  Admission requested  Review of Systems: As per HPI otherwise 10 point review of systems negative.    Past Medical History:  Diagnosis Date   ADD (attention deficit disorder)    Eating disorder    Febrile seizure (HCC)    History of eye surgery    Physical growth delay    Type 1 diabetes mellitus not at goal  [facility])     Past Surgical History:  Procedure Laterality Date   EYE MUSCLE SURGERY      Social History:  reports that she has never smoked. She has been exposed to tobacco smoke. She has never used smokeless tobacco. She reports current alcohol use. She reports that she does not use drugs.  Allergies  Allergen Reactions   Penicillins Hives    Family History  Problem Relation Age of Onset   Cancer Maternal Grandmother    Hypertension Maternal Grandfather    Depression Maternal Grandfather    Diabetes Paternal Grandfather    Anxiety disorder Father    Depression Mother    Anxiety disorder Sister      Prior to Admission medications   Medication Sig Start Date End Date Taking? Authorizing Provider  buPROPion (WELLBUTRIN XL) 300 MG 24 hr tablet TAKE 1 TABLET BY MOUTH EVERY DAY Patient taking differently: 300 mg daily. 05/26/21  Yes [name], FNP  escitalopram (LEXAPRO) 20 MG tablet Take 1 tablet (20 mg total) by mouth daily. 07/15/21 07/15/22 Yes [name], FNP  fluconazole (DIFLUCAN) 150 MG tablet Take 150 mg by mouth daily.   Yes [provider]  fluticasone (FLONASE) 50 MCG/ACT nasal spray 1 spray daily as needed for allergies. 02/06/19  Yes [provider]  Glucagon (BAQSIMI ONE PACK) 3 MG/DOSE POWD Place 1  Dose into the nose as needed. Patient taking differently: Place 1 Dose into the nose as needed (low blood sugar). 10/07/19  Yes Gretchen Short, NP  hydrOXYzine (ATARAX/VISTARIL) 10 MG tablet Take 1 tablet (10 mg total) by mouth 3 (three) times daily as needed. Patient taking differently: Take 10 mg by mouth 3 (three) times daily as needed for anxiety. 05/11/21  Yes [name], FNP  ibuprofen (ADVIL,MOTRIN) 200 MG tablet Take 400 mg by mouth every 6 (six) hours as needed for headache, mild pain or cramping.   Yes [provider]  insulin aspart (NOVOLOG FLEXPEN) 100 UNIT/ML FlexPen INJECT UP TO 50 UNITS SUBCUTANEOUSLY  DAILY Patient taking differently: 0-50 Units 3 (three) times daily with meals. Per sliding scale 02/18/21  Yes Gretchen Short, NP  insulin degludec (TRESIBA FLEXTOUCH) 100 UNIT/ML FlexTouch Pen Inject 30 Units into the skin at bedtime. Inject up to 50 units per day SubQ Patient taking differently: Inject 30 Units into the skin at bedtime. 07/22/21  Yes Ghimire, Werner Lean, MD  norgestimate-ethinyl estradiol (SPRINTEC 28) 0.25-35 MG-MCG tablet TAKE 1 TABLET DAILY. DISCARD PLACEBOS AND TAKE ACTIVE PILLS FOR CONTINUOUS CYCLING Patient taking differently: Take 1 tablet by mouth every evening. 05/04/20  Yes [name], FNP  ondansetron (ZOFRAN) 8 MG tablet Take 1 tablet (8 mg total) by mouth every 8 (eight) hours as needed for nausea or vomiting. 07/20/21  Yes [name], FNP  prazosin (MINIPRESS) 2 MG capsule Take 1 capsule (2 mg total) by mouth at bedtime. 05/18/21  Yes [name], FNP  ACCU-CHEK FASTCLIX LANCETS MISC Check sugar 10 x daily 10/21/15   Gretchen Short, NP  Continuous Blood Gluc Sensor (DEXCOM G6 SENSOR) MISC 1 APPLICATION BY DOES NOT APPLY ROUTE AS NEEDED. 01/05/21   Gretchen Short, NP  Continuous Blood Gluc Transmit (DEXCOM G6 TRANSMITTER) MISC 1 application by Does not apply route continuous as needed. 07/23/20   Gretchen Short, NP  glucose blood test strip Use as instructed 05/13/21   Gretchen Short, NP  Insulin Pen Needle (BD PEN NEEDLE NANO U/F) 32G X 4 MM MISC Use with insulin pens 6x daily 10/22/20   Gretchen Short, NP    Physical Exam: Vitals:   08/09/21 1630 08/09/21 1700 08/09/21 1800 08/09/21 1830  BP: 95/69 103/73 111/82 108/69  Pulse:  (!) 107 87 86  Resp: 17 12 15 15   Temp:      TempSrc:      SpO2:  100% 100% 100%  Weight:      Height:        Constitutional: NAD, calm, comfortable Eyes: PERRL, lids and conjunctivae normal ENMT: Mucous membranes are moist. Posterior pharynx clear of any exudate or lesions.Normal dentition.  Neck: normal,  supple, no masses, no thyromegaly Respiratory: clear to auscultation bilaterally, no wheezing, no crackles. Normal respiratory effort. No accessory muscle use.  Cardiovascular: Regular rate and rhythm, no murmurs / rubs / gallops. No extremity edema. 2+ pedal pulses. No carotid bruits.  Abdomen: no tenderness, no masses palpated. No hepatosplenomegaly. Bowel sounds positive.  Musculoskeletal: no clubbing / cyanosis. No joint deformity upper and lower extremities. Good ROM, no contractures. Normal muscle tone.  Skin: no rashes, lesions, ulcers. No induration Neurologic: CN 2-12 grossly intact. Sensation intact, DTR normal. Strength 5/5 in all 4.  Psychiatric: Normal judgment and insight. Alert and oriented x 3. Normal mood.    Labs on Admission: I have personally reviewed following labs and imaging studies  CBC: Recent Labs  Lab  08/09/21 1531  WBC 4.7  HGB 17.0*  HCT 48.3*  MCV 93.8  PLT 291   Basic Metabolic Panel: Recent Labs  Lab 08/09/21 1530 08/09/21 1910  NA 131* 133*  K 3.2* 4.2  CL 95* 106  CO2 8* 13*  GLUCOSE 424* 105*  BUN 13 9  CREATININE 1.24* 0.68  CALCIUM 8.9 7.7*   GFR: Estimated Creatinine Clearance: 75.7 mL/min (by C-G formula based on SCr of 0.68 mg/dL). Liver Function Tests: Recent Labs  Lab 08/09/21 1530  AST 29  ALT 55*  ALKPHOS 114  BILITOT 2.2*  PROT 8.5*  ALBUMIN 4.6   No results for input(s): LIPASE, AMYLASE in the last 168 hours. No results for input(s): AMMONIA in the last 168 hours. Coagulation Profile: No results for input(s): INR, PROTIME in the last 168 hours. Cardiac Enzymes: No results for input(s): CKTOTAL, CKMB, CKMBINDEX, TROPONINI in the last 168 hours. BNP (last 3 results) No results for input(s): PROBNP in the last 8760 hours. HbA1C: No results for input(s): HGBA1C in the last 72 hours. CBG: Recent Labs  Lab 08/09/21 1430 08/09/21 1828  GLUCAP >600* 124*   Lipid Profile: No results for input(s): CHOL, HDL,  LDLCALC, TRIG, CHOLHDL, LDLDIRECT in the last 72 hours. Thyroid Function Tests: No results for input(s): TSH, T4TOTAL, FREET4, T3FREE, THYROIDAB in the last 72 hours. Anemia Panel: No results for input(s): VITAMINB12, FOLATE, FERRITIN, TIBC, IRON, RETICCTPCT in the last 72 hours. Urine analysis:    Component Value Date/Time   COLORURINE YELLOW 08/09/2021 1610   APPEARANCEUR CLEAR 08/09/2021 1610   LABSPEC >1.030 (H) 08/09/2021 1610   PHURINE 5.5 08/09/2021 1610   GLUCOSEU >=500 (A) 08/09/2021 1610   HGBUR TRACE (A) 08/09/2021 1610   BILIRUBINUR NEGATIVE 08/09/2021 1610   BILIRUBINUR neg 05/25/2021 0956   KETONESUR >80 (A) 08/09/2021 1610   PROTEINUR TRACE (A) 08/09/2021 1610   UROBILINOGEN negative (A) 05/25/2021 0956   UROBILINOGEN 0.2 08/16/2011 0330   NITRITE NEGATIVE 08/09/2021 1610   LEUKOCYTESUR NEGATIVE 08/09/2021 1610    Radiological Exams on Admission: DG Chest Portable 1 View  Result Date: 08/09/2021 CLINICAL DATA:  Chest pain EXAM: PORTABLE CHEST 1 VIEW COMPARISON:  07/27/2021 FINDINGS: Heart and mediastinal contours are within normal limits. No focal opacities or effusions. No acute bony abnormality. IMPRESSION: No active disease. Electronically Signed   By: Charlett Nose M.D.   On: 08/09/2021 15:32    EKG: Independently reviewed.  Sinus tachycardia  Assessment/Plan Principal Problem:   DKA (diabetic ketoacidosis) (HCC)     Diabetic ketoacidosis -Related to noncompliance -Patient started on IV fluids -She will need IV insulin -We will transition to subcutaneous insulin once anion gap has closed and serum bicarb has improved  Recent COVID infection -She is more than 10 days out from her original infection, and does not report any respiratory symptoms -Would not place her in isolation at this point  Recent flu a infection -She completed a course of Tamiflu  Hypokalemia -Replace  Discomfort while swallowing -May be related to candidal esophagitis versus  GERD -Started on PPI Magic mouthwash  DVT prophylaxis: Lovenox Code Status: Full code Family Communication: Offered to call patient's parents, but she says she will update them herself Disposition Plan: Discharge home once improved Consults called:   Admission status: Observation, stepdown  Erick Blinks MD [facility]   If 7PM-7AM, please contact night-coverage [email or web address]   08/09/2021, 8:17 PM

## 2021-08-09 NOTE — ED Provider Notes (Signed)
[facility] EMERGENCY DEPARTMENT Provider Note   CSN: [id number] Arrival date & time: 08/09/21  1415     History Chief Complaint  Patient presents with   Hyperglycemia   Chest Pain    Kelli Davis is a 20 y.o. female.   Hyperglycemia Associated symptoms: abdominal pain, chest pain, fatigue, nausea, vomiting and weakness   Associated symptoms: no confusion and no fever   Chest Pain Associated symptoms: abdominal pain, cough, fatigue, nausea, vomiting and weakness   Associated symptoms: no back pain and no fever   Patient with chest pain nausea and vomiting.  Previous admissions for DKA.  Discharged about 10 days ago from hospital for DKA that was also associated with COVID.  States now her chest is hurting more.  States she is been weak since she left the hospital.  States that she has no one to take care of her at the house because her mom has to work so no one was taking care of her like she is supposed to have when she leaves the hospital.  Patient states she feels so weak and has been somewhat confused and thinks she may have not been taking her medicines.  Chest feels tight.  Also had previous DKA with flu prior to COVID.    Past Medical History:  Diagnosis Date   ADD (attention deficit disorder)    Eating disorder    Febrile seizure (HCC)    History of eye surgery    Physical growth delay    Type 1 diabetes mellitus not at goal [facility])     Patient Active Problem List   Diagnosis Date Noted   Influenza A 07/27/2021   COVID-19 virus infection 07/27/2021   AKI (acute kidney injury) (HCC) 07/27/2021   DKA (diabetic ketoacidosis) (HCC) 07/21/2021   Current moderate episode of major depressive disorder without prior episode (HCC) 05/25/2021   Vitamin D deficiency 05/05/2020   Insulin dose changed (HCC) 09/03/2018   Mixed hyperlipidemia 09/03/2018   Generalized anxiety disorder 09/04/2017   Oral contraceptive pill surveillance 09/04/2017   Eating disorder 05/16/2016    Insomnia 04/12/2016   Adjustment disorder with mixed anxiety and depressed mood 10/08/2015   ADHD (attention deficit hyperactivity disorder), combined type 05/20/2015   Maladaptive health behaviors affecting medical condition 10/17/2013   Hyperglycemia 07/11/2011   Hypoglycemia associated with diabetes (HCC)    Uncontrolled type 1 diabetes mellitus with hyperglycemia (HCC) 12/13/2010    Past Surgical History:  Procedure Laterality Date   EYE MUSCLE SURGERY       OB History   No obstetric history on file.     Family History  Problem Relation Age of Onset   Cancer Maternal Grandmother    Hypertension Maternal Grandfather    Depression Maternal Grandfather    Diabetes Paternal Grandfather    Anxiety disorder Father    Depression Mother    Anxiety disorder Sister     Social History   Tobacco Use   Smoking status: Never    Passive exposure: Yes   Smokeless tobacco: Never   Tobacco comments:    Pt reports only once  Substance Use Topics   Alcohol use: Yes    Comment: Pt reports only once or twice   Drug use: No    Home Medications Prior to Admission medications   Medication Sig Start Date End Date Taking? Authorizing Provider  ACCU-CHEK FASTCLIX LANCETS MISC Check sugar 10 x daily 10/21/15   Gretchen Short, NP  buPROPion (WELLBUTRIN XL) 300 MG 24  hr tablet TAKE 1 TABLET BY MOUTH EVERY DAY Patient taking differently: 300 mg daily. 05/26/21   [name], FNP  Continuous Blood Gluc Sensor (DEXCOM G6 SENSOR) MISC 1 APPLICATION BY DOES NOT APPLY ROUTE AS NEEDED. 01/05/21   Gretchen Short, NP  Continuous Blood Gluc Transmit (DEXCOM G6 TRANSMITTER) MISC 1 application by Does not apply route continuous as needed. 07/23/20   Gretchen Short, NP  escitalopram (LEXAPRO) 20 MG tablet Take 1 tablet (20 mg total) by mouth daily. 07/15/21 07/15/22  [name], FNP  fluticasone (FLONASE) 50 MCG/ACT nasal spray 1 spray daily as needed for allergies. 02/06/19   [provider]  Glucagon (BAQSIMI ONE PACK) 3 MG/DOSE POWD Place 1 Dose into the nose as needed. Patient taking differently: Place 1 Dose into the nose as needed (low blood sugar). 10/07/19   Gretchen Short, NP  glucose blood test strip Use as instructed 05/13/21   Gretchen Short, NP  hydrOXYzine (ATARAX/VISTARIL) 10 MG tablet Take 1 tablet (10 mg total) by mouth 3 (three) times daily as needed. Patient taking differently: Take 10 mg by mouth 3 (three) times daily as needed for anxiety. 05/11/21   [name], FNP  ibuprofen (ADVIL,MOTRIN) 200 MG tablet Take 400 mg by mouth every 6 (six) hours as needed for headache, mild pain or cramping.    [provider]  insulin aspart (NOVOLOG FLEXPEN) 100 UNIT/ML FlexPen INJECT UP TO 50 UNITS SUBCUTANEOUSLY DAILY Patient taking differently: 0-50 Units 3 (three) times daily with meals. Per sliding scale 02/18/21   Gretchen Short, NP  insulin degludec (TRESIBA FLEXTOUCH) 100 UNIT/ML FlexTouch Pen Inject 30 Units into the skin at bedtime. Inject up to 50 units per day SubQ Patient taking differently: Inject 30 Units into the skin at bedtime. 07/22/21   Ghimire, Werner Lean, MD  Insulin Pen Needle (BD PEN NEEDLE NANO U/F) 32G X 4 MM MISC Use with insulin pens 6x daily 10/22/20   Gretchen Short, NP  norgestimate-ethinyl estradiol (SPRINTEC 28) 0.25-35 MG-MCG tablet TAKE 1 TABLET DAILY. DISCARD PLACEBOS AND TAKE ACTIVE PILLS FOR CONTINUOUS CYCLING Patient taking differently: Take 1 tablet by mouth every evening. 05/04/20   [name], FNP  ondansetron (ZOFRAN) 8 MG tablet Take 1 tablet (8 mg total) by mouth every 8 (eight) hours as needed for nausea or vomiting. 07/20/21   [name], FNP  prazosin (MINIPRESS) 2 MG capsule Take 1 capsule (2 mg total) by mouth at bedtime. 05/18/21   [name], FNP    Allergies    Penicillins  Review of Systems   Review of Systems  Constitutional:  Positive for appetite change and fatigue.  Negative for fever.  HENT:  Negative for congestion.   Respiratory:  Positive for cough.   Cardiovascular:  Positive for chest pain.  Gastrointestinal:  Positive for abdominal pain, nausea and vomiting.  Genitourinary:  Negative for flank pain.  Musculoskeletal:  Negative for back pain.  Skin:  Negative for rash.  Neurological:  Positive for weakness.  Psychiatric/Behavioral:  Negative for confusion.    Physical Exam Updated Vital Signs BP (!) 122/94    Pulse (!) 106    Temp (!) 97.4 F (36.3 C) (Oral)    Resp 19    Ht 4\' 11"  (1.499 m)    Wt 43.1 kg    LMP  (LMP Unknown)    SpO2 100%    BMI 19.19 kg/m   Physical Exam Vitals reviewed.  Constitutional:  Appearance: She is well-developed.  Cardiovascular:     Rate and Rhythm: Regular rhythm. Tachycardia present.  Pulmonary:     Breath sounds: No wheezing or rhonchi.  Chest:     Chest wall: No tenderness.  Abdominal:     Comments: Mild diffuse abdominal tenderness without rebound or guarding.  No hernia palpated.  Musculoskeletal:     Cervical back: Neck supple.     Right lower leg: No edema.     Left lower leg: No edema.  Neurological:     Mental Status: She is alert and oriented to person, place, and time.    ED Results / Procedures / Treatments   Labs (all labs ordered are listed, but only abnormal results are displayed) Labs Reviewed  CBC - Abnormal; Notable for the following components:      Result Value   RBC 5.15 (*)    Hemoglobin 17.0 (*)    HCT 48.3 (*)    All other components within normal limits  COMPREHENSIVE METABOLIC PANEL - Abnormal; Notable for the following components:   Sodium 131 (*)    Potassium 3.2 (*)    Chloride 95 (*)    CO2 8 (*)    Glucose, Bld 424 (*)    Creatinine, Ser 1.24 (*)    Total Protein 8.5 (*)    ALT 55 (*)    Total Bilirubin 2.2 (*)    Anion gap 28 (*)    All other components within normal limits  BLOOD GAS, VENOUS - Abnormal; Notable for the following components:   pH,  Ven 7.153 (*)    pCO2, Ven 24.6 (*)    pO2, Ven 47.5 (*)    Bicarbonate 10.5 (*)    Acid-base deficit 18.9 (*)    All other components within normal limits  CBG MONITORING, ED - Abnormal; Notable for the following components:   Glucose-Capillary >600 (*)    All other components within normal limits  URINALYSIS, ROUTINE W REFLEX MICROSCOPIC  BETA-HYDROXYBUTYRIC ACID  BETA-HYDROXYBUTYRIC ACID  BASIC METABOLIC PANEL  BASIC METABOLIC PANEL  BASIC METABOLIC PANEL  CBG MONITORING, ED  POC URINE PREG, ED  POC URINE PREG, ED  CBG MONITORING, ED    EKG EKG Interpretation  Date/Time:  Monday August 09 2021 14:46:52 EST Ventricular Rate:  125 PR Interval:  140 QRS Duration: 84 QT Interval:  308 QTC Calculation: 444 R Axis:   92 Text Interpretation: Sinus tachycardia Right atrial enlargement Rightward axis Nonspecific ST abnormality Abnormal ECG Confirmed by Benjiman Core [id number]) on 08/09/2021 3:05:21 PM  Radiology DG Chest Portable 1 View  Result Date: 08/09/2021 CLINICAL DATA:  Chest pain EXAM: PORTABLE CHEST 1 VIEW COMPARISON:  07/27/2021 FINDINGS: Heart and mediastinal contours are within normal limits. No focal opacities or effusions. No acute bony abnormality. IMPRESSION: No active disease. Electronically Signed   By: Charlett Nose M.D.   On: 08/09/2021 15:32    Procedures Procedures   Medications Ordered in ED Medications  sodium chloride 0.9 % bolus 1,000 mL (has no administration in time range)  insulin regular, human (MYXREDLIN) 100 units/ 100 mL infusion (has no administration in time range)  lactated ringers infusion (has no administration in time range)  dextrose 5 % in lactated ringers infusion (has no administration in time range)  dextrose 50 % solution 0-50 mL (has no administration in time range)  potassium chloride 10 mEq in 100 mL IVPB (has no administration in time range)  lactated ringers bolus 1,000 mL (1,000 mLs Intravenous  New Bag/Given 08/09/21  1524)    ED Course  I have reviewed the triage vital signs and the nursing notes.  Pertinent labs & imaging results that were available during my care of the patient were reviewed by me and considered in my medical decision making (see chart for details).    MDM Rules/Calculators/A&P                         Patient presents feeling bad.  DKA 10 days ago.  That has been associated with flu.  Also had 1 recently also associate with COVID.  States she is been weak since she got home.  States for the last few days she has been unable to eat.  States there is pain she gets in her anterior chest whenever she eats.  Not having fevers.  Found to be in DKA here.  pH of 7.1.  Bicarb of 8 or 10.  Patient has had a tachycardia.  EKG showed sinus but on the monitor it appears if she may be going in and out of an SVT all of the right is somewhat controlled around 150 the right appeared something she would go in and out of.  Improved with hydration.  Patient is actually eager to try to eat some food.  However with the DKA will require admission to the hospital.  Will discuss with hospitalist.  Potassium is low.  With giving insulin will supplement.  Creatinine mildly increased at 1.2.  CRITICAL CARE Performed by: Benjiman Core Total critical care time: 30 minutes Critical care time was exclusive of separately billable procedures and treating other patients. Critical care was necessary to treat or prevent imminent or life-threatening deterioration. Critical care was time spent personally by me on the following activities: development of treatment plan with patient and/or surrogate as well as nursing, discussions with consultants, evaluation of patient's response to treatment, examination of patient, obtaining history from patient or surrogate, ordering and performing treatments and interventions, ordering and review of laboratory studies, ordering and review of radiographic studies, pulse oximetry and  re-evaluation of patient's condition.     Final Clinical Impression(s) / ED Diagnoses Final diagnoses:  Diabetic ketoacidosis without coma associated with type 1 diabetes mellitus [facility])    Rx / DC Orders ED Discharge Orders     None        Benjiman Core, MD 08/09/21 1642

## 2021-08-10 DIAGNOSIS — Z91199 Patient's noncompliance with other medical treatment and regimen due to unspecified reason: Secondary | ICD-10-CM | POA: Diagnosis not present

## 2021-08-10 DIAGNOSIS — E876 Hypokalemia: Secondary | ICD-10-CM

## 2021-08-10 DIAGNOSIS — N179 Acute kidney failure, unspecified: Secondary | ICD-10-CM | POA: Diagnosis present

## 2021-08-10 DIAGNOSIS — Z79899 Other long term (current) drug therapy: Secondary | ICD-10-CM | POA: Diagnosis not present

## 2021-08-10 DIAGNOSIS — B3781 Candidal esophagitis: Secondary | ICD-10-CM | POA: Diagnosis present

## 2021-08-10 DIAGNOSIS — F32A Depression, unspecified: Secondary | ICD-10-CM

## 2021-08-10 DIAGNOSIS — Z8619 Personal history of other infectious and parasitic diseases: Secondary | ICD-10-CM | POA: Diagnosis not present

## 2021-08-10 DIAGNOSIS — F419 Anxiety disorder, unspecified: Secondary | ICD-10-CM | POA: Diagnosis present

## 2021-08-10 DIAGNOSIS — Z793 Long term (current) use of hormonal contraceptives: Secondary | ICD-10-CM | POA: Diagnosis not present

## 2021-08-10 DIAGNOSIS — E1010 Type 1 diabetes mellitus with ketoacidosis without coma: Secondary | ICD-10-CM | POA: Diagnosis present

## 2021-08-10 DIAGNOSIS — Z880 Allergy status to penicillin: Secondary | ICD-10-CM | POA: Diagnosis not present

## 2021-08-10 DIAGNOSIS — Z794 Long term (current) use of insulin: Secondary | ICD-10-CM | POA: Diagnosis not present

## 2021-08-10 DIAGNOSIS — Z8616 Personal history of COVID-19: Secondary | ICD-10-CM | POA: Diagnosis not present

## 2021-08-10 DIAGNOSIS — Z8249 Family history of ischemic heart disease and other diseases of the circulatory system: Secondary | ICD-10-CM | POA: Diagnosis not present

## 2021-08-10 DIAGNOSIS — E860 Dehydration: Secondary | ICD-10-CM | POA: Diagnosis present

## 2021-08-10 DIAGNOSIS — Z833 Family history of diabetes mellitus: Secondary | ICD-10-CM | POA: Diagnosis not present

## 2021-08-10 DIAGNOSIS — K219 Gastro-esophageal reflux disease without esophagitis: Secondary | ICD-10-CM | POA: Diagnosis present

## 2021-08-10 LAB — GLUCOSE, CAPILLARY
Glucose-Capillary: 115 mg/dL — ABNORMAL HIGH (ref 70–99)
Glucose-Capillary: 115 mg/dL — ABNORMAL HIGH (ref 70–99)
Glucose-Capillary: 119 mg/dL — ABNORMAL HIGH (ref 70–99)
Glucose-Capillary: 123 mg/dL — ABNORMAL HIGH (ref 70–99)
Glucose-Capillary: 125 mg/dL — ABNORMAL HIGH (ref 70–99)
Glucose-Capillary: 125 mg/dL — ABNORMAL HIGH (ref 70–99)
Glucose-Capillary: 130 mg/dL — ABNORMAL HIGH (ref 70–99)
Glucose-Capillary: 141 mg/dL — ABNORMAL HIGH (ref 70–99)
Glucose-Capillary: 144 mg/dL — ABNORMAL HIGH (ref 70–99)
Glucose-Capillary: 150 mg/dL — ABNORMAL HIGH (ref 70–99)
Glucose-Capillary: 160 mg/dL — ABNORMAL HIGH (ref 70–99)
Glucose-Capillary: 214 mg/dL — ABNORMAL HIGH (ref 70–99)
Glucose-Capillary: 216 mg/dL — ABNORMAL HIGH (ref 70–99)
Glucose-Capillary: 237 mg/dL — ABNORMAL HIGH (ref 70–99)
Glucose-Capillary: 256 mg/dL — ABNORMAL HIGH (ref 70–99)

## 2021-08-10 LAB — BETA-HYDROXYBUTYRIC ACID
Beta-Hydroxybutyric Acid: 3.43 mmol/L — ABNORMAL HIGH (ref 0.05–0.27)
Beta-Hydroxybutyric Acid: 0.24 mmol/L (ref 0.05–0.27)

## 2021-08-10 LAB — BASIC METABOLIC PANEL
Anion gap: 12 (ref 5–15)
Anion gap: 14 (ref 5–15)
Anion gap: 9 (ref 5–15)
Anion gap: 9 (ref 5–15)
BUN: 5 mg/dL — ABNORMAL LOW (ref 6–20)
BUN: 5 mg/dL — ABNORMAL LOW (ref 6–20)
BUN: 7 mg/dL (ref 6–20)
BUN: <5 mg/dL — ABNORMAL LOW (ref 6–20)
CO2: 16 mmol/L — ABNORMAL LOW (ref 22–32)
CO2: 19 mmol/L — ABNORMAL LOW (ref 22–32)
CO2: 21 mmol/L — ABNORMAL LOW (ref 22–32)
CO2: 22 mmol/L (ref 22–32)
Calcium: 7.9 mg/dL — ABNORMAL LOW (ref 8.9–10.3)
Calcium: 8 mg/dL — ABNORMAL LOW (ref 8.9–10.3)
Calcium: 8.1 mg/dL — ABNORMAL LOW (ref 8.9–10.3)
Calcium: 8.3 mg/dL — ABNORMAL LOW (ref 8.9–10.3)
Chloride: 102 mmol/L (ref 98–111)
Chloride: 106 mmol/L (ref 98–111)
Chloride: 106 mmol/L (ref 98–111)
Chloride: 106 mmol/L (ref 98–111)
Creatinine, Ser: 0.49 mg/dL (ref 0.44–1.00)
Creatinine, Ser: 0.54 mg/dL (ref 0.44–1.00)
Creatinine, Ser: 0.6 mg/dL (ref 0.44–1.00)
Creatinine, Ser: 0.6 mg/dL (ref 0.44–1.00)
GFR, Estimated: >60 mL/min (ref >60)
GFR, Estimated: >60 mL/min (ref >60)
GFR, Estimated: >60 mL/min (ref >60)
GFR, Estimated: >60 mL/min (ref >60)
Glucose, Bld: 122 mg/dL — ABNORMAL HIGH (ref 70–99)
Glucose, Bld: 128 mg/dL — ABNORMAL HIGH (ref 70–99)
Glucose, Bld: 175 mg/dL — ABNORMAL HIGH (ref 70–99)
Glucose, Bld: 245 mg/dL — ABNORMAL HIGH (ref 70–99)
Potassium: 3.1 mmol/L — ABNORMAL LOW (ref 3.5–5.1)
Potassium: 3.3 mmol/L — ABNORMAL LOW (ref 3.5–5.1)
Potassium: 4.3 mmol/L (ref 3.5–5.1)
Potassium: 4.3 mmol/L (ref 3.5–5.1)
Sodium: 133 mmol/L — ABNORMAL LOW (ref 135–145)
Sodium: 136 mmol/L (ref 135–145)
Sodium: 136 mmol/L (ref 135–145)
Sodium: 137 mmol/L (ref 135–145)

## 2021-08-10 LAB — MAGNESIUM: Magnesium: 1.4 mg/dL — ABNORMAL LOW (ref 1.7–2.4)

## 2021-08-10 LAB — MRSA NEXT GEN BY PCR, NASAL: MRSA by PCR Next Gen: NOT DETECTED

## 2021-08-10 MED ORDER — MAGNESIUM SULFATE 4 GM/100ML IV SOLN
4.0000 g | Freq: Once | INTRAVENOUS | Status: AC
  Administered 2021-08-10: 13:00:00 4 g via INTRAVENOUS
  Filled 2021-08-10: qty 100

## 2021-08-10 MED ORDER — ENOXAPARIN SODIUM 40 MG/0.4ML IJ SOSY
40.0000 mg | PREFILLED_SYRINGE | INTRAMUSCULAR | Status: DC
  Administered 2021-08-10: 11:00:00 40 mg via SUBCUTANEOUS
  Filled 2021-08-10 (×2): qty 0.4

## 2021-08-10 MED ORDER — INSULIN ASPART 100 UNIT/ML IJ SOLN
4.0000 [IU] | Freq: Three times a day (TID) | INTRAMUSCULAR | Status: DC
  Administered 2021-08-10 (×2): 4 [IU] via SUBCUTANEOUS

## 2021-08-10 MED ORDER — ESCITALOPRAM OXALATE 10 MG PO TABS
20.0000 mg | ORAL_TABLET | Freq: Every day | ORAL | Status: DC
  Administered 2021-08-10 – 2021-08-11 (×2): 20 mg via ORAL
  Filled 2021-08-10 (×2): qty 2

## 2021-08-10 MED ORDER — HYDROXYZINE HCL 25 MG PO TABS
25.0000 mg | ORAL_TABLET | Freq: Three times a day (TID) | ORAL | Status: DC | PRN
  Administered 2021-08-10: 01:00:00 25 mg via ORAL
  Filled 2021-08-10: qty 1

## 2021-08-10 MED ORDER — POTASSIUM CHLORIDE CRYS ER 20 MEQ PO TBCR
40.0000 meq | EXTENDED_RELEASE_TABLET | ORAL | Status: DC
  Administered 2021-08-10 (×2): 40 meq via ORAL
  Filled 2021-08-10 (×2): qty 2

## 2021-08-10 MED ORDER — INSULIN ASPART 100 UNIT/ML IJ SOLN
0.0000 [IU] | Freq: Three times a day (TID) | INTRAMUSCULAR | Status: DC
  Administered 2021-08-10 (×2): 3 [IU] via SUBCUTANEOUS

## 2021-08-10 MED ORDER — INSULIN GLARGINE-YFGN 100 UNIT/ML ~~LOC~~ SOLN
25.0000 [IU] | SUBCUTANEOUS | Status: DC
  Administered 2021-08-10 – 2021-08-11 (×2): 25 [IU] via SUBCUTANEOUS
  Filled 2021-08-10 (×3): qty 0.25

## 2021-08-10 MED ORDER — BUPROPION HCL ER (XL) 150 MG PO TB24
300.0000 mg | ORAL_TABLET | Freq: Every day | ORAL | Status: DC
  Administered 2021-08-10 – 2021-08-11 (×2): 300 mg via ORAL
  Filled 2021-08-10 (×2): qty 2

## 2021-08-10 MED ORDER — LACTATED RINGERS IV SOLN: INTRAVENOUS | Status: DC

## 2021-08-10 MED ORDER — PRAZOSIN HCL 2 MG PO CAPS
2.0000 mg | ORAL_CAPSULE | Freq: Every day | ORAL | Status: DC
  Administered 2021-08-10: 21:00:00 2 mg via ORAL
  Filled 2021-08-10 (×3): qty 1

## 2021-08-10 NOTE — Progress Notes (Signed)
PROGRESS NOTE    Kelli Davis  JEH:[id number] DOB: Dec 06, 2000 DOA: 08/09/2021 PCP: [name], FNP    Brief Narrative:  Kelli Davis is a 20 y.o. female with medical history significant of type 1 diabetes, recently tested positive for influenza A and COVID, was recently discharged from the hospital after being treated for DKA.  She reports that upon leaving, she continued to be increasingly weak and fatigued.  She was asleep for most of the day.  She was staying with her mom who was working for the day, and patient was left home alone for the majority of the day.  She reports sleeping throughout the day, and felt too weak to get up and take her medications or eat anything significant.  She reports taking her basal insulin approximately 2 times in the past week.  She was trying to take her NovoLog more regularly, but admits to taking without any food.  She did have some vomiting earlier today, but none earlier in the week.  She has not had any dysuria.  She did describe some discomfort with eating and drinking in her throat, chest and abdomen.  It is described as burning.  She has not had any shortness of breath.  She is following her blood sugars at home approximately once or twice a day and noted them to be anywhere from 200-400.   Assessment & Plan:   Principal Problem:   DKA (diabetic ketoacidosis) (HCC)   Diabetic ketoacidosis -Related to noncompliance -Patient treated with IV fluids and IV insulin -Anion gap closed and bicarb is improved -On follow-up chemistries, bicarbonate noted to trend back down to 16 -She is eating down blood sugars are trending up -We will continue IV fluids, monitor serum chemistry -If serum chemistry improved in a.m. and blood sugars are controlled, anticipate discharge home in the morning   Recent COVID infection -She is more than 10 days out from her original infection, and does not report any respiratory symptoms -Would not place her in  isolation at this point   Recent flu A infection -She completed a course of Tamiflu   Hypokalemia -Replace   Discomfort while swallowing -May be related to candidal esophagitis versus GERD -Started on PPI and Magic mouthwash  Anxiety/depression -Chronically on Lexapro and Wellbutrin -She reports having a follow-up appointment scheduled next week with her mental health provider. -Currently, she denies any suicidal ideations.   DVT prophylaxis: enoxaparin (LOVENOX) injection 40 mg Start: 08/10/21 1000  Code Status: Full code Family Communication: No family present Disposition Plan: Status is: Inpatient  Remains inpatient appropriate because: Continued monitoring of electrolytes and blood sugars.         Consultants:    Procedures:    Antimicrobials:      Subjective: She says she is feeling better today.  Nausea and vomiting have improved.  She is hungry and wants to eat.  Objective: Vitals:   08/10/21 1400 08/10/21 1500 08/10/21 1600 08/10/21 1616  BP: 99/65 124/81 120/73   Pulse: (!) 103 (!) 108 92 90  Resp: 17 15 19 19   Temp:    97.9 F (36.6 C)  TempSrc:    Oral  SpO2: 99% 100% 100% 99%  Weight:      Height:        Intake/Output Summary (Last 24 hours) at 08/10/2021 1906 Last data filed at 08/10/2021 1535 Gross per 24 hour  Intake 3123.17 ml  Output --  Net 3123.17 ml   Filed Weights   08/09/21 1429  08/09/21 2130  Weight: 43.1 kg 47.7 kg    Examination:  General exam: Appears calm and comfortable  Respiratory system: Clear to auscultation. Respiratory effort normal. Cardiovascular system: S1 & S2 heard, RRR. No JVD, murmurs, rubs, gallops or clicks. No pedal edema. Gastrointestinal system: Abdomen is nondistended, soft and nontender. No organomegaly or masses felt. Normal bowel sounds heard. Central nervous system: Alert and oriented. No focal neurological deficits. Extremities: Symmetric 5 x 5 power. Skin: No rashes, lesions or  ulcers Psychiatry: Judgement and insight appear normal. Mood & affect appropriate.     Data Reviewed: I have personally reviewed following labs and imaging studies  CBC: Recent Labs  Lab 08/09/21 1531  WBC 4.7  HGB 17.0*  HCT 48.3*  MCV 93.8  PLT 291   Basic Metabolic Panel: Recent Labs  Lab 08/09/21 1910 08/10/21 0010 08/10/21 0359 08/10/21 0740 08/10/21 1211  NA 133* 133* 136 137 136  K 4.2 4.3 3.3* 3.1* 4.3  CL 106 102 106 106 106  CO2 13* 19* 21* 22 16*  GLUCOSE 105* 175* 122* 128* 245*  BUN 9 7 5* 5* <5*  CREATININE 0.68 0.60 0.49 0.54 0.60  CALCIUM 7.7* 8.3* 8.1* 8.0* 7.9*  MG  --   --   --  1.4*  --    GFR: Estimated Creatinine Clearance: 75.9 mL/min (by C-G formula based on SCr of 0.6 mg/dL). Liver Function Tests: Recent Labs  Lab 08/09/21 1530  AST 29  ALT 55*  ALKPHOS 114  BILITOT 2.2*  PROT 8.5*  ALBUMIN 4.6   No results for input(s): LIPASE, AMYLASE in the last 168 hours. No results for input(s): AMMONIA in the last 168 hours. Coagulation Profile: No results for input(s): INR, PROTIME in the last 168 hours. Cardiac Enzymes: No results for input(s): CKTOTAL, CKMB, CKMBINDEX, TROPONINI in the last 168 hours. BNP (last 3 results) No results for input(s): PROBNP in the last 8760 hours. HbA1C: No results for input(s): HGBA1C in the last 72 hours. CBG: Recent Labs  Lab 08/10/21 1116 08/10/21 1155 08/10/21 1245 08/10/21 1615 08/10/21 1840  GLUCAP 125* 214* 256* 237* 115*   Lipid Profile: No results for input(s): CHOL, HDL, LDLCALC, TRIG, CHOLHDL, LDLDIRECT in the last 72 hours. Thyroid Function Tests: No results for input(s): TSH, T4TOTAL, FREET4, T3FREE, THYROIDAB in the last 72 hours. Anemia Panel: No results for input(s): VITAMINB12, FOLATE, FERRITIN, TIBC, IRON, RETICCTPCT in the last 72 hours. Sepsis Labs: No results for input(s): PROCALCITON, LATICACIDVEN in the last 168 hours.  Recent Results (from the past 240 hour(s))  MRSA  Next Gen by PCR, Nasal     Status: None   Collection Time: 08/09/21  9:40 PM   Specimen: Nasal Mucosa; Nasal Swab  Result Value Ref Range Status   MRSA by PCR Next Gen NOT DETECTED NOT DETECTED Final    Comment: (NOTE) The GeneXpert MRSA Assay (FDA approved for NASAL specimens only), is one component of a comprehensive MRSA colonization surveillance program. It is not intended to diagnose MRSA infection nor to guide or monitor treatment for MRSA infections. Test performance is not FDA approved in patients less than 24 years old. Performed at [facility], [address], [place], [place] [address]          Radiology Studies: DG Chest Portable 1 View  Result Date: 08/09/2021 CLINICAL DATA:  Chest pain EXAM: PORTABLE CHEST 1 VIEW COMPARISON:  07/27/2021 FINDINGS: Heart and mediastinal contours are within normal limits. No focal opacities or effusions. No  acute bony abnormality. IMPRESSION: No active disease. Electronically Signed   By: Charlett Nose M.D.   On: 08/09/2021 15:32        Scheduled Meds:  buPROPion  300 mg Oral Daily   Chlorhexidine Gluconate Cloth  6 each Topical Daily   enoxaparin (LOVENOX) injection  40 mg Subcutaneous Q24H   escitalopram  20 mg Oral Daily   insulin aspart  0-9 Units Subcutaneous TID WC   insulin aspart  4 Units Subcutaneous TID WC   insulin glargine-yfgn  25 Units Subcutaneous Q24H   magic mouthwash w/lidocaine  10 mL Oral TID   pantoprazole  40 mg Oral BID AC   prazosin  2 mg Oral QHS   Continuous Infusions:  lactated ringers 125 mL/hr at 08/10/21 1843     LOS: 0 days    Time spent:    Erick Blinks, MD [facility]   If 7PM-7AM, please contact night-coverage [email or web address]  08/10/2021, 7:06 PM

## 2021-08-10 NOTE — Progress Notes (Addendum)
Inpatient Diabetes Program Recommendations  AACE/ADA: New Consensus Statement on Inpatient Glycemic Control (2015)  Target Ranges:  Prepandial:   less than 140 mg/dL      Peak postprandial:   less than 180 mg/dL (1-2 hours)      Critically ill patients:  140 - 180 mg/dL   Lab Results  Component Value Date   GLUCAP 123 (H) 08/10/2021   HGBA1C 13.1 (H) 07/22/2021    Review of Glycemic Control  Latest Reference Range & Units 08/10/21 04:42 08/10/21 05:46 08/10/21 06:43  Glucose-Capillary 70 - 99 mg/dL 741 (H) 423 (H) 953 (H)   Diabetes history: DM1 Outpatient Diabetes medications: Tresiba 30 units QHS, Novolog SSI, Novolog 1 unit/5 CHO with meals   Current orders for Inpatient glycemic control: IV insulin/DKA order set  Inpatient Diabetes Program Recommendations:    This is patient's 3rd admit for DKA this month.  Patient was diagnosed with Type 1 DM at age 20. She has had recent loss in her life with her boyfriend passing away tragically 3 months ago.  Mother was present with last admissions.  Will follow and will attempt to talk to patient when appropriate. Labs improved and will likely be ready for transition this AM.   Consider Semglee 25 units 2 hours prior to d/c of insulin drip. Also consider Novolog 4 units tid with meals for meal coverage and Novolog sensitive correction tid with meals and HS.    May consider psych consult due to repetitive admissions. Patient has history of eating disorder and depression.  She has experienced recent loss of her boyfriend as well.    Will follow.   Thanks,  Beryl Meager, RN, BC-ADM Inpatient Diabetes Coordinator Pager [phone]  (8a-5p)  Addendum 1450: Called and spoke with patient's mother. She states that patient has lost lots of weight and that they are concerned about her repetitive visits to the hospital.  Reminded her of the importance of checking blood sugar frequently and correcting with Novolog as needed.  Mother states she is  unsure of scale needed when patient is not eating.  She plans to call Barron Alvine, NP to get copy of outpatient scale. She also plans to let [facility] stay with her mother while she is working so that she will have extra help.  Encouraged them to replace Dexcom sensor to and to let health care provider know if she is unable to eat or keep food/liquids down for >6-8 hours.  Sent message to Alfonso Ramus and Barron Alvine as well (outpatient providers) to make sure they are aware of admissions.   Will add sick day rules to d/c instructions as well. Mother seems very supportive but is frustrated.  She asked patient if she wanted to talk to me and she said "no, you've asked all the questions".  This patient needs close outpatient f/u however both providers have said that she has either cancelled or no showed several times. Mother states that they are interested in insulin pump for patient as well.

## 2021-08-10 NOTE — Progress Notes (Signed)
°  Transition of Care (TOC) Screening Note   Patient Details  Name: Kelli Davis Date of Birth: 03-09-2001   Transition of Care [facility]) CM/SW Contact:    Annice Needy, LCSW Phone Number: 08/10/2021, 3:26 PM    Transition of Care Department [facility]) has reviewed patient and no TOC needs have been identified at this time. We will continue to monitor patient advancement through interdisciplinary progression rounds. If new patient transition needs arise, please place a TOC consult.   Abbiegail Landgren, Juleen China, LCSW

## 2021-08-11 ENCOUNTER — Other Ambulatory Visit: Payer: Self-pay | Admitting: Pediatrics

## 2021-08-11 LAB — CBC
HCT: 32 % — ABNORMAL LOW (ref 36.0–46.0)
Hemoglobin: 11 g/dL — ABNORMAL LOW (ref 12.0–15.0)
MCH: 32 pg (ref 26.0–34.0)
MCHC: 34.4 g/dL (ref 30.0–36.0)
MCV: 93 fL (ref 80.0–100.0)
Platelets: 165 10*3/uL (ref 150–400)
RBC: 3.44 MIL/uL — ABNORMAL LOW (ref 3.87–5.11)
RDW: 13.2 % (ref 11.5–15.5)
WBC: 3.3 10*3/uL — ABNORMAL LOW (ref 4.0–10.5)
nRBC: 0 % (ref 0.0–0.2)

## 2021-08-11 LAB — BASIC METABOLIC PANEL
Anion gap: 6 (ref 5–15)
BUN: <5 mg/dL — ABNORMAL LOW (ref 6–20)
CO2: 25 mmol/L (ref 22–32)
Calcium: 7.8 mg/dL — ABNORMAL LOW (ref 8.9–10.3)
Chloride: 108 mmol/L (ref 98–111)
Creatinine, Ser: 0.5 mg/dL (ref 0.44–1.00)
GFR, Estimated: >60 mL/min (ref >60)
Glucose, Bld: 72 mg/dL (ref 70–99)
Potassium: 3.2 mmol/L — ABNORMAL LOW (ref 3.5–5.1)
Sodium: 139 mmol/L (ref 135–145)

## 2021-08-11 LAB — GLUCOSE, CAPILLARY
Glucose-Capillary: 71 mg/dL (ref 70–99)
Glucose-Capillary: 141 mg/dL — ABNORMAL HIGH (ref 70–99)

## 2021-08-11 LAB — MAGNESIUM: Magnesium: 1.8 mg/dL (ref 1.7–2.4)

## 2021-08-11 MED ORDER — PANTOPRAZOLE SODIUM 40 MG PO TBEC: 40.0000 mg | DELAYED_RELEASE_TABLET | Freq: Every day | ORAL | 2 refills | Status: DC

## 2021-08-11 MED ORDER — POTASSIUM CHLORIDE CRYS ER 20 MEQ PO TBCR
40.0000 meq | EXTENDED_RELEASE_TABLET | ORAL | Status: AC
  Administered 2021-08-11 (×2): 40 meq via ORAL
  Filled 2021-08-11 (×2): qty 2

## 2021-08-11 NOTE — Discharge Instructions (Addendum)
- 1)Avoid ibuprofen/Advil/Aleve/Motrin/Goody Powders/Naproxen/BC powders/Meloxicam/Diclofenac/Indomethacin and other Nonsteroidal anti-inflammatory medications as these will make you more likely to bleed and can cause stomach ulcers, can also cause Kidney problems.  2)follow up with [name], FNP (Primary Care Provider) for adjustment of insulin regimen---    Carbohydrate Counting For People With Diabetes  Foods with carbohydrates make your blood glucose level go up. Learning how to count carbohydrates can help you control your blood glucose levels. First, identify the foods you eat that contain carbohydrates. Then, using the Foods with Carbohydrates chart, determine about how much carbohydrates are in your meals and snacks. Make sure you are eating foods with fiber, protein, and healthy fat along with your carbohydrate foods. Foods with Carbohydrates The following table shows carbohydrate foods that have about 15 grams of carbohydrate each. Using measuring cups, spoons, or a food scale when you first begin learning about carbohydrate counting can help you learn about the portion sizes you typically eat. The following foods have 15 grams carbohydrate each:  Grains 1 slice bread (1 ounce)  1 small tortilla (6-inch size)   large bagel (1 ounce)  1/3 cup pasta or rice (cooked)   hamburger or hot dog bun ( ounce)   cup cooked cereal   to  cup ready-to-eat cereal  2 taco shells (5-inch size) Fruit 1 small fresh fruit ( to 1 cup)   medium banana  17 small grapes (3 ounces)  1 cup melon or berries   cup canned or frozen fruit  2 tablespoons dried fruit (blueberries, cherries, cranberries, raisins)   cup unsweetened fruit juice  Starchy Vegetables  cup cooked beans, peas, corn, potatoes/sweet potatoes   large baked potato (3 ounces)  1 cup acorn or butternut squash  Snack Foods 3 to 6 crackers  8 potato chips or 13 tortilla chips ( ounce to 1 ounce)  3 cups popped  popcorn  Dairy 3/4 cup (6 ounces) nonfat plain yogurt, or yogurt with sugar-free sweetener  1 cup milk  1 cup plain rice, soy, coconut or flavored almond milk Sweets and Desserts  cup ice cream or frozen yogurt  1 tablespoon jam, jelly, pancake syrup, table sugar, or honey  2 tablespoons light pancake syrup  1 inch square of frosted cake or 2 inch square of unfrosted cake  2 small cookies (2/3 ounce each) or  large cookie  Sometimes youll have to estimate carbohydrate amounts if you dont know the exact recipe. One cup of mixed foods like soups can have 1 to 2 carbohydrate servings, while some casseroles might have 2 or more servings of carbohydrate. Foods that have less than 20 calories in each serving can be counted as free foods. Count 1 cup raw vegetables, or  cup cooked non-starchy vegetables as free foods. If you eat 3 or more servings at one meal, then count them as 1 carbohydrate serving.  Foods without Carbohydrates  Not all foods contain carbohydrates. Meat, some dairy, fats, non-starchy vegetables, and many beverages dont contain carbohydrate. So when you count carbohydrates, you can generally exclude chicken, pork, beef, fish, seafood, eggs, tofu, cheese, butter, sour cream, avocado, nuts, seeds, olives, mayonnaise, water, black coffee, unsweetened tea, and zero-calorie drinks. Vegetables with no or low carbohydrate include green beans, cauliflower, tomatoes, and onions. How much carbohydrate should I eat at each meal?  Carbohydrate counting can help you plan your meals and manage your weight. Following are some starting points for carbohydrate intake at each meal. Work with your registered dietitian nutritionist  to find the best range that works for your blood glucose and weight.   To Lose Weight To Maintain Weight  Women 2 - 3 carb servings 3 - 4 carb servings  Men 3 - 4 carb servings 4 - 5 carb servings  Checking your blood glucose after meals will help you know if you need  to adjust the timing, type, or number of carbohydrate servings in your meal plan. Achieve and keep a healthy body weight by balancing your food intake and physical activity.  Tips How should I plan my meals?  Plan for half the food on your plate to include non-starchy vegetables, like salad greens, broccoli, or carrots. Try to eat 3 to 5 servings of non-starchy vegetables every day. Have a protein food at each meal. Protein foods include chicken, fish, meat, eggs, or beans (note that beans contain carbohydrate). These two food groups (non-starchy vegetables and proteins) are low in carbohydrate. If you fill up your plate with these foods, you will eat less carbohydrate but still fill up your stomach. Try to limit your carbohydrate portion to  of the plate.  What fats are healthiest to eat?  Diabetes increases risk for heart disease. To help protect your heart, eat more healthy fats, such as olive oil, nuts, and avocado. Eat less saturated fats like butter, cream, and high-fat meats, like bacon and sausage. Avoid trans fats, which are in all foods that list partially hydrogenated oil as an ingredient. What should I drink?  Choose drinks that are not sweetened with sugar. The healthiest choices are water, carbonated or seltzer waters, and tea and coffee without added sugars.  Sweet drinks will make your blood glucose go up very quickly. One serving of soda or energy drink is  cup. It is best to drink these beverages only if your blood glucose is low.  Artificially sweetened, or diet drinks, typically do not increase your blood glucose if they have zero calories in them. Read labels of beverages, as some diet drinks do have carbohydrate and will raise your blood glucose. Label Reading Tips Read Nutrition Facts labels to find out how many grams of carbohydrate are in a food you want to eat. Dont forget: sometimes serving sizes on the label arent the same as how much food you are going to eat, so you  may need to calculate how much carbohydrate is in the food you are serving yourself.   Carbohydrate Counting for People with Diabetes Sample 1-Day Menu  Breakfast  cup yogurt, low fat, low sugar (1 carbohydrate serving)   cup cereal, ready-to-eat, unsweetened (1 carbohydrate serving)  1 cup strawberries (1 carbohydrate serving)   cup almonds ( carbohydrate serving)  Lunch 1, 5 ounce can chunk light tuna  2 ounces cheese, low fat cheddar  6 whole wheat crackers (1 carbohydrate serving)  1 small apple (1 carbohydrate servings)   cup carrots ( carbohydrate serving)   cup snap peas  1 cup 1% milk (1 carbohydrate serving)   Evening Meal Stir fry made with: 3 ounces chicken  1 cup brown rice (3 carbohydrate servings)   cup broccoli ( carbohydrate serving)   cup green beans   cup onions  1 tablespoon olive oil  2 tablespoons teriyaki sauce ( carbohydrate serving)  Evening Snack 1 extra small banana (1 carbohydrate serving)  1 tablespoon peanut butter   Carbohydrate Counting for People with Diabetes Vegan Sample 1-Day Menu  Breakfast 1 cup cooked oatmeal (2 carbohydrate servings)   cup blueberries (  1 carbohydrate serving)  2 tablespoons flaxseeds  1 cup soymilk fortified with calcium and vitamin D  1 cup coffee  Lunch 2 slices whole wheat bread (2 carbohydrate servings)   cup baked tofu   cup lettuce  2 slices tomato  2 slices avocado   cup baby carrots ( carbohydrate serving)  1 orange (1 carbohydrate serving)  1 cup soymilk fortified with calcium and vitamin D   Evening Meal Burrito made with: 1 6-inch corn tortilla (1 carbohydrate serving)  1 cup refried vegetarian beans (2 carbohydrate servings)   cup chopped tomatoes   cup lettuce   cup salsa  1/3 cup brown rice (1 carbohydrate serving)  1 tablespoon olive oil for rice   cup zucchini   Evening Snack 6 small whole grain crackers (1 carbohydrate serving)  2 apricots ( carbohydrate serving)   cup  unsalted peanuts ( carbohydrate serving)    Carbohydrate Counting for People with Diabetes Vegetarian (Lacto-Ovo) Sample 1-Day Menu  Breakfast 1 cup cooked oatmeal (2 carbohydrate servings)   cup blueberries (1 carbohydrate serving)  2 tablespoons flaxseeds  1 egg  1 cup 1% milk (1 carbohydrate serving)  1 cup coffee  Lunch 2 slices whole wheat bread (2 carbohydrate servings)  2 ounces low-fat cheese   cup lettuce  2 slices tomato  2 slices avocado   cup baby carrots ( carbohydrate serving)  1 orange (1 carbohydrate serving)  1 cup unsweetened tea  Evening Meal Burrito made with: 1 6-inch corn tortilla (1 carbohydrate serving)   cup refried vegetarian beans (1 carbohydrate serving)   cup tomatoes   cup lettuce   cup salsa  1/3 cup brown rice (1 carbohydrate serving)  1 tablespoon olive oil for rice   cup zucchini  1 cup 1% milk (1 carbohydrate serving)  Evening Snack 6 small whole grain crackers (1 carbohydrate serving)  2 apricots ( carbohydrate serving)   cup unsalted peanuts ( carbohydrate serving)    Copyright 2020  Academy of [employer] and Dietetics. All rights reserved.  Using Nutrition Labels: Carbohydrate  Serving Size  Look at the serving size. All the information on the label is based on this portion. Servings Per Container  The number of servings contained in the package. Guidelines for Carbohydrate  Look at the total grams of carbohydrate in the serving size.  1 carbohydrate choice = 15 grams of carbohydrate. Range of Carbohydrate Grams Per Choice  Carbohydrate Grams/Choice Carbohydrate Choices  6-10   11-20 1  21-25 1  26-35 2  36-40 2  41-50 3  51-55 3  56-65 4  66-70 4  71-80 5    Copyright 2020  Academy of [employer] and Dietetics. All rights reserved.

## 2021-08-11 NOTE — Progress Notes (Addendum)
Patient alert and oriented x4. No complaints of pain, shortness of breath, chest pain, dizziness, nausea or vomiting. Patient tolerated PO meds and diet well, appetite good. Patient up out of bed, ambulatory independently with steady gait. Patient refused lunch tray due to waiting on being discharged and stated she wanted to eat when she left hospital. Dr Marisa Severin aware that insulin not given at that time due to pending discharge. Patient stated she will check her sugar and give herself insulin when she eats lunch after leaving hospital. Patient took long acting insulin this AM per request and expressed full understanding of what and how much she was getting, only ate a little at breakfast. Went over discharge summary/instructions with patient along with scheduling follow up appointments and medication education. All questions answered and patient expressed full understanding of discharge instructions, follow up appointment info and education with teach back. IV was removed and gauze replaced with clean gauze, No current bleeding noted at discharge. Patient discharged with all belongings for home via car.

## 2021-08-11 NOTE — Discharge Summary (Addendum)
Kelli Davis, is a 20 y.o. female  DOB 2001/02/21  MRN [id number].  Admission date:  08/09/2021  Admitting Physician  Erick Blinks, MD  Discharge Date:  08/11/2021   Primary MD  [name], FNP  Recommendations for primary care physician for things to follow:   1)Avoid ibuprofen/Advil/Aleve/Motrin/Goody Powders/Naproxen/BC powders/Meloxicam/Diclofenac/Indomethacin and other Nonsteroidal anti-inflammatory medications as these will make you more likely to bleed and can cause stomach ulcers, can also cause Kidney problems.  2)follow up with [name], FNP (Primary Care Provider) for adjustment of insulin regimen---   Admission Diagnosis  DKA (diabetic ketoacidosis) (HCC) [E11.10] Diabetic ketoacidosis without coma associated with type 1 diabetes mellitus (HCC) [E10.10]   Discharge Diagnosis  DKA (diabetic ketoacidosis) (HCC) [E11.10] Diabetic ketoacidosis without coma associated with type 1 diabetes mellitus (HCC) [E10.10]    Principal Problem:   DKA (diabetic ketoacidosis) (HCC)      Past Medical History:  Diagnosis Date   ADD (attention deficit disorder)    Eating disorder    Febrile seizure (HCC)    History of eye surgery    Physical growth delay    Type 1 diabetes mellitus not at goal [facility])     Past Surgical History:  Procedure Laterality Date   EYE MUSCLE SURGERY       HPI  from the history and physical done on the day of admission:    Chief Complaint: Generalized weakness   HPI: Kelli Davis is a 20 y.o. female with medical history significant of type 1 diabetes, recently tested positive for influenza A and COVID, was recently discharged from the hospital after being treated for DKA.  She reports that upon leaving, she continued to be increasingly weak and fatigued.  She was asleep for most of the day.  She was staying with her mom who was working for the day,  and patient was left home alone for the majority of the day.  She reports sleeping throughout the day, and felt too weak to get up and take her medications or eat anything significant.  She reports taking her basal insulin approximately 2 times in the past week.  She was trying to take her NovoLog more regularly, but admits to taking without any food.  She did have some vomiting earlier today, but none earlier in the week.  She has not had any dysuria.  She did describe some discomfort with eating and drinking in her throat, chest and abdomen.  It is described as burning.  She has not had any shortness of breath.  She is following her blood sugars at home approximately once or twice a day and noted them to be anywhere from 200-400.   ED Course: She was evaluated in emergency room where she was noted to be in DKA with a serum bicarb of 8.  Blood gas showed pH of 7.15.  Patient was started on IV fluids and insulin infusion was ordered.  Admission requested   Review of Systems: As per HPI otherwise 10 point review of systems  negative.       Hospital Course:     1)DKA--most likely due to noncompliance and poor oral intake in setting of recent COVID-19 and current influenza infection -DKA pathophysiology resolved with IV insulin and IV fluids -Okay to transition back to subcu insulin with outpatient follow-up -Patient encouraged to maintain adequate hydration and avoid dehydration   2)Acute kidney injury with elevated creatinine above baseline -Creatinine 1.24 baseline usually around 0.6 --Due to dehydration in the setting of DKA -Creatinine back to 0.5 from 1.24 after hydration  3)Hypokalemia/Hypomagnesemia--- replaced   4) anxiety and depression--- stable, compliant with Wellbutrin and Lexapro advised -Outpatient follow-up with mental health provider advised  5) recent COVID-19 infection and recent concomitant influenza A  infection--- completed Tamiflu -Symptomatically much  better,  6)GERD --avoid NSAIDs, ??  Candidiasis--- Diflucan and Protonix as prescribed  Discharge Condition: stable  Follow UP   Follow-up Information     [name], FNP. Schedule an appointment as soon as possible for a visit in 1 week(s).   Specialty: Pediatrics Contact information: [address] [place] [address] [phone]                  Consults obtained - na  Diet and Activity recommendation:  As advised  Discharge Instructions    Discharge Instructions     Call MD for:  difficulty breathing, headache or visual disturbances   Complete by: As directed    Call MD for:  persistant dizziness or light-headedness   Complete by: As directed    Call MD for:  severe uncontrolled pain   Complete by: As directed    Call MD for:  temperature >100.4   Complete by: As directed    Diet Carb Modified   Complete by: As directed    Discharge instructions   Complete by: As directed    1)Avoid ibuprofen/Advil/Aleve/Motrin/Goody Powders/Naproxen/BC powders/Meloxicam/Diclofenac/Indomethacin and other Nonsteroidal anti-inflammatory medications as these will make you more likely to bleed and can cause stomach ulcers, can also cause Kidney problems.  2)follow up with [name], FNP (Primary Care Provider) for adjustment of insulin regimen---   Increase activity slowly   Complete by: As directed        Discharge Medications     Allergies as of 08/11/2021       Reactions   Penicillins Hives        Medication List     STOP taking these medications    ibuprofen 200 MG tablet Commonly known as: ADVIL       TAKE these medications    Accu-Chek FastClix Lancets Misc Check sugar 10 x daily   Baqsimi One Pack 3 MG/DOSE Powd Generic drug: Glucagon Place 1 Dose into the nose as needed. What changed: reasons to take this   BD Pen Needle Nano U/F 32G X 4 MM Misc Generic drug: Insulin Pen Needle Use with insulin pens 6x  daily   buPROPion 300 MG 24 hr tablet Commonly known as: WELLBUTRIN XL TAKE 1 TABLET BY MOUTH EVERY DAY What changed: how to take this   Dexcom G6 Sensor Misc 1 APPLICATION BY DOES NOT APPLY ROUTE AS NEEDED.   Dexcom G6 Transmitter Misc 1 application by Does not apply route continuous as needed.   escitalopram 20 MG tablet Commonly known as: LEXAPRO TAKE 1 TABLET BY MOUTH EVERY DAY   fluconazole 150 MG tablet Commonly known as: DIFLUCAN Take 150 mg by mouth daily.   fluticasone 50 MCG/ACT nasal  spray Commonly known as: FLONASE 1 spray daily as needed for allergies.   glucose blood test strip Use as instructed   hydrOXYzine 10 MG tablet Commonly known as: ATARAX Take 1 tablet (10 mg total) by mouth 3 (three) times daily as needed. What changed: reasons to take this   norgestimate-ethinyl estradiol 0.25-35 MG-MCG tablet Commonly known as: Sprintec 28 TAKE 1 TABLET DAILY. DISCARD PLACEBOS AND TAKE ACTIVE PILLS FOR CONTINUOUS CYCLING What changed:  how much to take how to take this when to take this additional instructions   NovoLOG FlexPen 100 UNIT/ML FlexPen Generic drug: insulin aspart INJECT UP TO 50 UNITS SUBCUTANEOUSLY DAILY What changed:  how much to take when to take this additional instructions   ondansetron 8 MG tablet Commonly known as: Zofran Take 1 tablet (8 mg total) by mouth every 8 (eight) hours as needed for nausea or vomiting.   pantoprazole 40 MG tablet Commonly known as: Protonix Take 1 tablet (40 mg total) by mouth daily.   prazosin 2 MG capsule Commonly known as: MINIPRESS Take 1 capsule (2 mg total) by mouth at bedtime.   Evaristo Bury FlexTouch 100 UNIT/ML FlexTouch Pen Generic drug: insulin degludec Inject 30 Units into the skin at bedtime. Inject up to 50 units per day SubQ What changed: additional instructions       Major procedures and Radiology Reports - PLEASE review detailed and final reports for all details, in brief -  DG  Chest Portable 1 View  Result Date: 08/09/2021 CLINICAL DATA:  Chest pain EXAM: PORTABLE CHEST 1 VIEW COMPARISON:  07/27/2021 FINDINGS: Heart and mediastinal contours are within normal limits. No focal opacities or effusions. No acute bony abnormality. IMPRESSION: No active disease. Electronically Signed   By: Charlett Nose M.D.   On: 08/09/2021 15:32   DG CHEST PORT 1 VIEW  Result Date: 07/27/2021 CLINICAL DATA:  COVID, nausea, vomiting, diabetes EXAM: PORTABLE CHEST 1 VIEW COMPARISON:  None. FINDINGS: The heart size and mediastinal contours are within normal limits. Both lungs are clear. The visualized skeletal structures are unremarkable. IMPRESSION: No acute abnormality of the lungs in AP portable projection. Electronically Signed   By: Jearld Lesch M.D.   On: 07/27/2021 09:11    Micro Results   Recent Results (from the past 240 hour(s))  MRSA Next Gen by PCR, Nasal     Status: None   Collection Time: 08/09/21  9:40 PM   Specimen: Nasal Mucosa; Nasal Swab  Result Value Ref Range Status   MRSA by PCR Next Gen NOT DETECTED NOT DETECTED Final    Comment: (NOTE) The GeneXpert MRSA Assay (FDA approved for NASAL specimens only), is one component of a comprehensive MRSA colonization surveillance program. It is not intended to diagnose MRSA infection nor to guide or monitor treatment for MRSA infections. Test performance is not FDA approved in patients less than 2 years old. Performed at [facility], [address] [place], [place] [address]        Today   Subjective    Kelli Davis today has no new complaints -Eating and drinking well,  No Nausea, Vomiting or Diarrhea         Patient has been seen and examined prior to discharge   Objective   Blood pressure 119/88, pulse 87, temperature 97.8 F (36.6 C), temperature source Oral, resp. rate 20, height 4\' 11"  (1.499 m), weight 47.7 kg, SpO2 96 %.   Intake/Output Summary (Last 24 hours) at 08/11/2021 1343 Last data  filed at  08/10/2021 1535 Gross per 24 hour  Intake 305.5 ml  Output --  Net 305.5 ml    Exam Gen:- Awake Alert, no acute distress  HEENT:- [place].AT, No sclera icterus Neck-Supple Neck,No JVD,.  Lungs-  CTAB , good air movement bilaterally  CV- S1, S2 normal, regular Abd-  +ve B.Sounds, Abd Soft, No tenderness,    Extremity/Skin:- No  edema,   good pulses Psych-affect is appropriate, oriented x3 Neuro-no new focal deficits, no tremors    Data Review   CBC w Diff:  Lab Results  Component Value Date   WBC 3.3 (L) 08/11/2021   HGB 11.0 (L) 08/11/2021   HCT 32.0 (L) 08/11/2021   PLT 165 08/11/2021   LYMPHOPCT 64 07/29/2021   MONOPCT 6 07/29/2021   EOSPCT 1 07/29/2021   BASOPCT 0 07/29/2021    CMP:  Lab Results  Component Value Date   NA 139 08/11/2021   K 3.2 (L) 08/11/2021   CL 108 08/11/2021   CO2 25 08/11/2021   BUN <5 (L) 08/11/2021   CREATININE 0.50 08/11/2021   CREATININE 0.69 05/18/2021   PROT 8.5 (H) 08/09/2021   ALBUMIN 4.6 08/09/2021   BILITOT 2.2 (H) 08/09/2021   ALKPHOS 114 08/09/2021   AST 29 08/09/2021   ALT 55 (H) 08/09/2021  .   Total Discharge time is about 33 minutes  Shon Hale M.D on 08/11/2021 at 1:43 PM  Go to [email or web address] -  for contact info  [facility] - Office  [phone]

## 2021-08-11 NOTE — Progress Notes (Signed)
Inpatient Diabetes Program Recommendations  AACE/ADA: New Consensus Statement on Inpatient Glycemic Control   Target Ranges:  Prepandial:   less than 140 mg/dL      Peak postprandial:   less than 180 mg/dL (1-2 hours)      Critically ill patients:  140 - 180 mg/dL    Latest Reference Range & Units 08/11/21 07:52 08/11/21 11:50  Glucose-Capillary 70 - 99 mg/dL 71 409 (H)    Latest Reference Range & Units 08/10/21 11:16 08/10/21 11:55 08/10/21 12:45 08/10/21 16:15 08/10/21 18:40 08/10/21 20:54  Glucose-Capillary 70 - 99 mg/dL 811 (H) 914 (H) 782 (H) 237 (H) 115 (H) 216 (H)   Review of Glycemic Control  Diabetes history: DM1 (makes NO insulin; requires basal, correction, and carbohydrate coverage insulin) Outpatient Diabetes medications: Tresiba 30 units QHS, Novolog 1 unit for every 5 grams of carbs plus additional units for correction Current orders for Inpatient glycemic control: Semglee 25 units Q24H, Novolog 4 units TID with meals, Novolog 0-9 units TID with meals  Inpatient Diabetes Program Recommendations:    Insulin: Please consider decreasing Semglee to 22 units Q24H (starting 08/12/21; Semglee 25 units already given today).  Thanks, Orlando Penner, RN, MSN, CDE Diabetes Coordinator Inpatient Diabetes Program [phone] (Team Pager from 8am to 5pm)

## 2021-08-11 NOTE — Plan of Care (Signed)
Nutrition Education Note  RD consulted for nutrition education regarding uncontrolled type 1 diabetes.   Lab Results  Component Value Date   HGBA1C 13.1 (H) 07/22/2021   RD provided "Carbohydrate Counting for People with Diabetes" handout from the Academy of Nutrition and Dietetics. Discussed different food groups and their effects on blood sugar, emphasizing carbohydrate-containing foods. Provided list of carbohydrates and recommended serving sizes of common foods.  Pt able to tell RD everything RD was planning to go over for nutrition prior to education. Emphasized the importance of medication compliance and eating consistently.  Discussed importance of controlled and consistent carbohydrate intake throughout the day. Provided examples of ways to balance meals/snacks and encouraged intake of high-fiber, whole grain complex carbohydrates. Teach back method used.  Expect fair compliance.  Plan for discharge home later today.  Body mass index is 21.24 kg/m.   Current diet order is Carb Modified with no meal completions documented at this time. Labs and medications reviewed.   No further nutrition interventions warranted at this time. RD contact information provided. If additional nutrition issues arise, please re-consult RD.  Vertell Limber, RD, LDN (she/her/hers) Clinical Inpatient Dietitian RD Pager/After-Hours/Weekend Pager # in [place]

## 2021-08-12 ENCOUNTER — Ambulatory Visit (INDEPENDENT_AMBULATORY_CARE_PROVIDER_SITE_OTHER): Payer: BC Managed Care – PPO | Admitting: Family

## 2021-08-13 ENCOUNTER — Other Ambulatory Visit: Payer: Self-pay | Admitting: Pediatrics

## 2021-08-18 ENCOUNTER — Telehealth (INDEPENDENT_AMBULATORY_CARE_PROVIDER_SITE_OTHER): Payer: Self-pay | Admitting: Family

## 2021-08-18 NOTE — Telephone Encounter (Signed)
We can speak with her sister. We do not have her on a DPR.

## 2021-08-18 NOTE — Telephone Encounter (Signed)
Sister wanted to let [name] know that Ermel is going to come into her appointment tomorrow and let [name] know that everything is fine when shes not. The past 2 weeks she hasnt taken care of her self. The sister is exhausted with trying to take care of her. Shaneequa will not take care of her health. She ask if [name] could recommend something.

## 2021-08-18 NOTE — Telephone Encounter (Signed)
°  Who's calling (name and relationship to patient) : Leta Jungling ( sister)  Best contact number: [phone] Provider they see: Ovidio Kin   Reason for call: Sister is requesting to speak to [name] would not give any other information.      PRESCRIPTION REFILL ONLY  Name of prescription:  Pharmacy:

## 2021-08-19 ENCOUNTER — Ambulatory Visit (INDEPENDENT_AMBULATORY_CARE_PROVIDER_SITE_OTHER): Payer: BC Managed Care – PPO | Admitting: Family

## 2021-08-19 ENCOUNTER — Encounter (INDEPENDENT_AMBULATORY_CARE_PROVIDER_SITE_OTHER): Payer: Self-pay | Admitting: Family

## 2021-08-19 ENCOUNTER — Ambulatory Visit: Payer: BC Managed Care – PPO | Admitting: Pediatrics

## 2021-08-19 ENCOUNTER — Other Ambulatory Visit: Payer: Self-pay

## 2021-08-19 VITALS — BP 125/74 | HR 99 | Ht 59.55 in | Wt 124.0 lb

## 2021-08-19 VITALS — BP 112/62 | HR 62 | Wt 133.0 lb

## 2021-08-19 DIAGNOSIS — F902 Attention-deficit hyperactivity disorder, combined type: Secondary | ICD-10-CM

## 2021-08-19 DIAGNOSIS — E782 Mixed hyperlipidemia: Secondary | ICD-10-CM

## 2021-08-19 DIAGNOSIS — F419 Anxiety disorder, unspecified: Secondary | ICD-10-CM | POA: Diagnosis not present

## 2021-08-19 DIAGNOSIS — F509 Eating disorder, unspecified: Secondary | ICD-10-CM | POA: Diagnosis not present

## 2021-08-19 DIAGNOSIS — F321 Major depressive disorder, single episode, moderate: Secondary | ICD-10-CM

## 2021-08-19 DIAGNOSIS — F32A Depression, unspecified: Secondary | ICD-10-CM

## 2021-08-19 DIAGNOSIS — F411 Generalized anxiety disorder: Secondary | ICD-10-CM | POA: Diagnosis not present

## 2021-08-19 DIAGNOSIS — F5001 Anorexia nervosa, restricting type: Secondary | ICD-10-CM

## 2021-08-19 DIAGNOSIS — Z794 Long term (current) use of insulin: Secondary | ICD-10-CM

## 2021-08-19 DIAGNOSIS — E1065 Type 1 diabetes mellitus with hyperglycemia: Secondary | ICD-10-CM | POA: Diagnosis not present

## 2021-08-19 DIAGNOSIS — G4700 Insomnia, unspecified: Secondary | ICD-10-CM

## 2021-08-19 LAB — POCT GLYCOSYLATED HEMOGLOBIN (HGB A1C): Hemoglobin A1C: 11.5 % — AB (ref 4.0–5.6)

## 2021-08-19 LAB — POCT GLUCOSE (DEVICE FOR HOME USE): POC Glucose: 133 mg/dl — AB (ref 70–99)

## 2021-08-19 NOTE — Progress Notes (Signed)
Pediatric Endocrinology Diabetes Consultation Follow-up Visit  Letitia Sabala Aug 28, 2000 [id number]  Chief Complaint: Follow-up type 1 diabetes   [name], FNP   HPI: Kelli Davis  is a 21 y.o. female presenting for follow-up of type 1 diabetes. she is accompanied to this visit by her mother and father.   1. Kynadie was diagnosed with type 1 diabetes at age 8. At that time she was hospitalized at [facility] center and was in DKA. She was in the ICU for 2 days. She was initially followed by Dr. Langston Masker in [place] but transferred to this clinic after Dr. Langston Masker retired. She has been admitted in DKA two additional times since diagnosis. She has been on pump therapy since age 62.  2. Since last visit to [facility] on 102022, she has been "ok". She has hospitalized on 07/20/2021 with DKA after getting influenza a few days prior. Her blood sugars were running higher due to influenza and she was unable to hold down fluids. She was then hospitalized a second time on 07/27/2021 in DKA again. Hospitalized a third time in DKA on 08/09/2021.   Jamia has been living with her sister lately reports not much contact with mom and dad.   Anissia reports that she has not been taking care of herself, she realized this after her third admission. She states that she was sleeping all the time and not taking her insulin or checking her blood sugar. She is considering inpatient health care. She has looked into [facility] which is for eating disorders. She states that she has not been eating well but has been trying to improve her eating since discharge from hospital. She feels like it is a constant fight to make herself eat and she needs additional help.  She has already contacted [facility] and is going to do admission test tomorrow.   Since discharge from her last hospitalization she has been taking her insulin more consistently. She is giving [place] in the morning, 30 units per day. She  reports taking Novolog 5 x per day, usually 10 units per meal. She is currently doing finger stick blood sugars but plans to restart Dexcom CGM. She would like closed loop insulin pump but currently it is cost prohibitive for her.   She reports taking Wellbutrin and lexapro consistently.   Insulin regimen: 30  units of Tresiba. Novolog 120/30/5 Hypoglycemia: Able to feel low blood sugars.  No glucagon needed recently.  Dexcom CGm Download  Not wearing Dexcom CGM currently. Plans to get refills this week.  Blood glucose download shows she is checking 4-8 x per day  - Avg Bg 180 - In target range 50% of the time, above target 46% - Download begins on 08/11/2021 after discharge  Med-alert ID: Not currently wearing. Injection sites: arms and hips  Annual labs due:  Ophthalmology due: 2019. Discussed importance of annual dilated eye exam.     3. ROS: Greater than 10 systems reviewed with pertinent positives listed in HPI, otherwise neg. Constitutional: Reports frequent sleeping. + weight gain since discharge from hospital.  Eyes: No changes in vision, denies blurry vision. Wears glasses.  Ears/Nose/Mouth/Throat: No difficulty swallowing. No neck pain  Cardiovascular: No palpitations, denies tachycardia  Respiratory: No increased work of breathing, No SOB  Gastrointestinal: No constipation or diarrhea. No abdominal pain Genitourinary: No nocturia, no polyuria Endocrine: No polydipsia.  No hyperpigmentation Psychiatric: Normal affect.+ anxiety and depression. Currently on Lexapro.   Past Medical History:   Past Medical History:  Diagnosis Date   ADD (attention deficit disorder)    Eating disorder    Febrile seizure (HCC)    History of eye surgery    Physical growth delay    Type 1 diabetes mellitus not at goal [facility](HCC)     Medications:  Outpatient Encounter Medications as of 08/19/2021  Medication Sig   ACCU-CHEK FASTCLIX LANCETS MISC Check sugar 10 x daily   buPROPion (WELLBUTRIN XL)  300 MG 24 hr tablet TAKE 1 TABLET BY MOUTH EVERY DAY (Patient taking differently: 300 mg daily.)   Continuous Blood Gluc Sensor (DEXCOM G6 SENSOR) MISC 1 APPLICATION BY DOES NOT APPLY ROUTE AS NEEDED.   Continuous Blood Gluc Transmit (DEXCOM G6 TRANSMITTER) MISC 1 application by Does not apply route continuous as needed.   escitalopram (LEXAPRO) 20 MG tablet TAKE 1 TABLET BY MOUTH EVERY DAY   fluconazole (DIFLUCAN) 150 MG tablet Take 150 mg by mouth daily.   fluticasone (FLONASE) 50 MCG/ACT nasal spray 1 spray daily as needed for allergies.   Glucagon (BAQSIMI ONE PACK) 3 MG/DOSE POWD Place 1 Dose into the nose as needed. (Patient taking differently: Place 1 Dose into the nose as needed (low blood sugar).)   glucose blood test strip Use as instructed   hydrOXYzine (ATARAX/VISTARIL) 10 MG tablet Take 1 tablet (10 mg total) by mouth 3 (three) times daily as needed. (Patient taking differently: Take 10 mg by mouth 3 (three) times daily as needed for anxiety.)   insulin aspart (NOVOLOG FLEXPEN) 100 UNIT/ML FlexPen INJECT UP TO 50 UNITS SUBCUTANEOUSLY DAILY (Patient taking differently: 0-50 Units 3 (three) times daily with meals. Per sliding scale)   insulin degludec (TRESIBA FLEXTOUCH) 100 UNIT/ML FlexTouch Pen Inject 30 Units into the skin at bedtime. Inject up to 50 units per day SubQ (Patient taking differently: Inject 30 Units into the skin at bedtime.)   Insulin Pen Needle (BD PEN NEEDLE NANO U/F) 32G X 4 MM MISC Use with insulin pens 6x daily   norgestimate-ethinyl estradiol (SPRINTEC 28) 0.25-35 MG-MCG tablet TAKE 1 TABLET DAILY. DISCARD PLACEBOS AND TAKE ACTIVE PILLS FOR CONTINUOUS CYCLING (Patient taking differently: Take 1 tablet by mouth every evening.)   ondansetron (ZOFRAN) 8 MG tablet Take 1 tablet (8 mg total) by mouth every 8 (eight) hours as needed for nausea or vomiting.   pantoprazole (PROTONIX) 40 MG tablet Take 1 tablet (40 mg total) by mouth daily.   prazosin (MINIPRESS) 2 MG capsule  TAKE 1 CAPSULE BY MOUTH AT BEDTIME.   No facility-administered encounter medications on file as of 08/19/2021.    Allergies: Allergies  Allergen Reactions   Penicillins Hives    Surgical History: Past Surgical History:  Procedure Laterality Date   EYE MUSCLE SURGERY      Family History:  Family History  Problem Relation Age of Onset   Cancer Maternal Grandmother    Hypertension Maternal Grandfather    Depression Maternal Grandfather    Diabetes Paternal Grandfather    Anxiety disorder Father    Depression Mother    Anxiety disorder Sister      Social History: Lives with: Living with friends.    Physical Exam:  Vitals:   08/19/21 1333  BP: 112/62  Pulse: 62  Weight: 133 lb (60.3 kg)      BP 112/62 (BP Location: Right Arm, Patient Position: Sitting, Cuff Size: Normal)    Pulse 62    Wt 133 lb (60.3 kg)    LMP  (LMP Unknown)    BMI 26.86  kg/m  Body mass index: body mass index is 26.86 kg/m. Growth percentile SmartLinks can only be used for patients less than 38 years old.  Ht Readings from Last 3 Encounters:  08/09/21 4\' 11"  (1.499 m)  07/28/21 4\' 11"  (1.499 m)  07/15/21 5' (1.524 m)   Wt Readings from Last 3 Encounters:  08/19/21 133 lb (60.3 kg)  08/09/21 105 lb 2.6 oz (47.7 kg)  07/28/21 107 lb 5.8 oz (48.7 kg)   Physical Exam  General: Tearful at time during visit, but in no acute distress.   Head: Normocephalic, atraumatic.   Eyes:  Pupils equal and round. EOMI.   Sclera white.  No eye drainage.   Ears/Nose/Mouth/Throat: Nares patent, no nasal drainage.  Normal dentition, mucous membranes moist.   Neck: supple, no cervical lymphadenopathy, no thyromegaly Cardiovascular: regular rate, normal S1/S2, no murmurs Respiratory: No increased work of breathing.  Lungs clear to auscultation bilaterally.  No wheezes. Extremities: warm, well perfused, cap refill < 2 sec.   Musculoskeletal: Normal muscle mass.  Normal strength Skin: warm, dry.  No rash or  lesions. Neurologic: alert and oriented, normal speech, no tremor   Labs:    Assessment/Plan: Azure is a 21 y.o. female with type 1 diabetes on MDI and CGM therapy. She has severely struggled with her health. 3 admission in one month for DKA due to noncompliance which is likely due to depression. Concerns about exacerbation of eating disorder per her most recent reports. She would greatly benefit from admission to inpatient psychiatric care and having the option for help with eating disorder would be very helpful. Her diabetes care has improved over the past 2 weeks since discharge but she will need very close supervision to ensure success.    1-3. DM w/o complication type I, uncontrolled (HCC)/hyperglycemia/Insulin dose change  - 30 units of Tresiba   - Novolog 120/30/5 plan   - At bedtime and 2am: 1 unit for every 50 points >150 and 1 unit for every 10 grams of carbs.  - Reviewed meter and CGM download. Discussed trends and patterns.  - Rotate injection  sites to prevent scar tissue.  - bolus 15 minutes prior to eating to limit blood sugar spikes.  - Reviewed carb counting and importance of accurate carb counting.  - Discussed signs and symptoms of hypoglycemia. Always have glucose available.  - POCT glucose and hemoglobin A1c  - Reviewed growth chart.  - Discussed insulin pump therapy.   4. Disordered eating - Discussed effects of binging/purging on blood sugars.  - I agree with admission to [facility] and support her decision for seeking help.   5. Depression/Anxiety  - Stressed that she needs close follow up with Adolescent medicine  - She would benefit from inpatient care as soon as possible.  - Continue Wellbutrin and Lexapro   6. Mixed hyperlipidemia  - Take 2000 units of fish oil daily.  - Low cholesterol diet.  - Fast lipid panel twice per year.     Follow-up: 2 weeks.    LOS: >45 spent today reviewing the medical chart, counseling the patient/family, and  documenting today's visit.  When a patient is on insulin, intensive monitoring of blood glucose levels is necessary to avoid hyperglycemia and hypoglycemia. Severe hyperglycemia/hypoglycemia can lead to hospital admissions and be life threatening.     36,  FNP-C  Pediatric Specialist  [address]  [place] [address] [place], [place]  Tele: [phone]

## 2021-08-19 NOTE — Patient Instructions (Signed)
-   Continue 30 units of Tresiba  - Novolog 120/30/5 plan

## 2021-08-19 NOTE — Patient Instructions (Signed)
[facility] [address] [place], [place] [address]  Phone: [phone]  Email:  [email or web address]

## 2021-08-19 NOTE — Progress Notes (Signed)
History was provided by the patient.  Kelli Davis is a 21 y.o. female who is here for T1DM, MDD, GAD, eating disorder, sleep disturbance and ADHD.  [name], FNP   HPI:  Pt reports she had three hospitalizations for DKA. Got flu, covid and then thrush. She didn't eat for almost a week with the thrush. She was also sleeping a lot so not taking her insulin. She was at [facility] d/t being in [place] for Christmas.   Now staying with her mom and is able to sleep in her bed again at the house. Her sister is checking on her every day and holding her accountable for checking sugars, eating and taking insulin.   Has been looking into [facility] for higher level of care. She felt alarmed when she saw her weight at endocrine (though it seems this weight isn't right). Feels she is doing better with her eating with supervision but feels like she needs HLOC to further address her eating and self care. She also had a nurse at last hospitalization who was making a lot of comments about her eating and she felt it was very triggering.   Mood has been ok- she is taking her medications much more consistently. This last week she has felt the most like herself in a long time.   Sleeping much better now. Got sleep schedule ironed out thanks to her sister's girlfriend. A few nightmares but not as bad as before.   Denies SI/HI/self harm.   No LMP recorded (lmp unknown). (Menstrual status: Oral contraceptives).   Patient Active Problem List   Diagnosis Date Noted   AKI (acute kidney injury) ([facility]) 07/27/2021   Current moderate episode of major depressive disorder without prior episode ([place]) 05/25/2021   Vitamin D deficiency 05/05/2020   Insulin dose changed ([facility]) 09/03/2018   Mixed hyperlipidemia 09/03/2018   Generalized anxiety disorder 09/04/2017   Oral contraceptive pill surveillance 09/04/2017   Eating disorder 05/16/2016   Insomnia 04/12/2016   ADHD (attention deficit hyperactivity  disorder), combined type 05/20/2015   Maladaptive health behaviors affecting medical condition 10/17/2013   Hypoglycemia associated with diabetes ([place])    Uncontrolled type 1 diabetes mellitus with hyperglycemia ([place]) 12/13/2010    Current Outpatient Medications on File Prior to Visit  Medication Sig Dispense Refill   ACCU-CHEK FASTCLIX LANCETS MISC Check sugar 10 x daily 300 each 3   buPROPion (WELLBUTRIN XL) 300 MG 24 hr tablet TAKE 1 TABLET BY MOUTH EVERY DAY (Patient taking differently: 300 mg daily.) 90 tablet 1   Continuous Blood Gluc Sensor (DEXCOM G6 SENSOR) MISC 1 APPLICATION BY DOES NOT APPLY ROUTE AS NEEDED. 3 each 5   Continuous Blood Gluc Transmit (DEXCOM G6 TRANSMITTER) MISC 1 application by Does not apply route continuous as needed. 1 each 3   escitalopram (LEXAPRO) 20 MG tablet TAKE 1 TABLET BY MOUTH EVERY DAY 90 tablet 1   fluconazole (DIFLUCAN) 150 MG tablet Take 150 mg by mouth daily.     fluticasone (FLONASE) 50 MCG/ACT nasal spray 1 spray daily as needed for allergies.     Glucagon (BAQSIMI ONE PACK) 3 MG/DOSE POWD Place 1 Dose into the nose as needed. (Patient taking differently: Place 1 Dose into the nose as needed (low blood sugar).) 2 each 1   glucose blood test strip Use as instructed 300 each 3   hydrOXYzine (ATARAX/VISTARIL) 10 MG tablet Take 1 tablet (10 mg total) by mouth 3 (three) times daily as needed. (Patient taking differently:  Take 10 mg by mouth 3 (three) times daily as needed for anxiety.) 30 tablet 3   insulin aspart (NOVOLOG FLEXPEN) 100 UNIT/ML FlexPen INJECT UP TO 50 UNITS SUBCUTANEOUSLY DAILY (Patient taking differently: 0-50 Units 3 (three) times daily with meals. Per sliding scale) 15 mL 5   insulin degludec (TRESIBA FLEXTOUCH) 100 UNIT/ML FlexTouch Pen Inject 30 Units into the skin at bedtime. Inject up to 50 units per day SubQ (Patient taking differently: Inject 30 Units into the skin at bedtime.)     Insulin Pen Needle (BD PEN NEEDLE NANO U/F) 32G X  4 MM MISC Use with insulin pens 6x daily 200 each 6   norgestimate-ethinyl estradiol (SPRINTEC 28) 0.25-35 MG-MCG tablet TAKE 1 TABLET DAILY. DISCARD PLACEBOS AND TAKE ACTIVE PILLS FOR CONTINUOUS CYCLING (Patient taking differently: Take 1 tablet by mouth every evening.) 112 tablet 4   ondansetron (ZOFRAN) 8 MG tablet Take 1 tablet (8 mg total) by mouth every 8 (eight) hours as needed for nausea or vomiting. 20 tablet 0   pantoprazole (PROTONIX) 40 MG tablet Take 1 tablet (40 mg total) by mouth daily. 30 tablet 2   prazosin (MINIPRESS) 2 MG capsule TAKE 1 CAPSULE BY MOUTH AT BEDTIME. 90 capsule 1   No current facility-administered medications on file prior to visit.    Allergies  Allergen Reactions   Penicillins Hives    Physical Exam:    Vitals:   08/19/21 1423  BP: 125/74  Pulse: 99  Weight: 124 lb (56.2 kg)  Height: 4' 11.55" (1.513 m)    Growth percentile SmartLinks can only be used for patients less than 77 years old.  Physical Exam Vitals and nursing note reviewed.  Constitutional:      General: She is not in acute distress.    Appearance: She is well-developed.  Neck:     Thyroid: No thyromegaly.  Cardiovascular:     Rate and Rhythm: Normal rate and regular rhythm.     Heart sounds: No murmur heard. Pulmonary:     Breath sounds: Normal breath sounds.  Abdominal:     Palpations: Abdomen is soft. There is no mass.     Tenderness: There is no abdominal tenderness. There is no guarding.  Musculoskeletal:     Right lower leg: No edema.     Left lower leg: No edema.  Lymphadenopathy:     Cervical: No cervical adenopathy.  Skin:    General: Skin is warm.     Findings: No rash.  Neurological:     Mental Status: She is alert.     Comments: No tremor  Psychiatric:        Mood and Affect: Mood normal.    Assessment/Plan: 1. Current moderate episode of major depressive disorder without prior episode ([place]) Continue lexapro and wellbutrin daily. Her mood has  improved with improved medication compliance.   2. Anorexia nervosa, restricting type She has had some triggers for more restrictive eating and is trying to seek more comprehensive treatment. We discussed getting her a therapist ASAP and I will reach out to [facility] to see if they do virtual IOP. She does not have transportation to go during the week. I don't suspect she will meet criteria for residential treatment which we discussed.   3. Generalized anxiety disorder As above. I messaged a therapist for her during our visit and asked her to let me know if she has not been able to make contact with them by Tuesday.   4. ADHD (attention  deficit hyperactivity disorder), combined type Continue wellbutrin xl 300 mg.   5. Insomnia, unspecified type Continue prazosin and hydroxyzine.   Return in 10 days with me   Jonathon Resides, FNP

## 2021-08-23 ENCOUNTER — Ambulatory Visit: Payer: BC Managed Care – PPO | Admitting: Family

## 2021-08-31 ENCOUNTER — Ambulatory Visit: Payer: BC Managed Care – PPO | Admitting: Pediatrics

## 2021-08-31 ENCOUNTER — Other Ambulatory Visit: Payer: Self-pay

## 2021-08-31 VITALS — BP 115/72 | HR 96 | Wt 123.0 lb

## 2021-08-31 DIAGNOSIS — F5001 Anorexia nervosa, restricting type: Secondary | ICD-10-CM | POA: Diagnosis not present

## 2021-08-31 DIAGNOSIS — E1065 Type 1 diabetes mellitus with hyperglycemia: Secondary | ICD-10-CM

## 2021-08-31 DIAGNOSIS — F321 Major depressive disorder, single episode, moderate: Secondary | ICD-10-CM | POA: Diagnosis not present

## 2021-08-31 DIAGNOSIS — G4700 Insomnia, unspecified: Secondary | ICD-10-CM

## 2021-08-31 DIAGNOSIS — F411 Generalized anxiety disorder: Secondary | ICD-10-CM | POA: Diagnosis not present

## 2021-08-31 DIAGNOSIS — F902 Attention-deficit hyperactivity disorder, combined type: Secondary | ICD-10-CM | POA: Diagnosis not present

## 2021-08-31 DIAGNOSIS — R5383 Other fatigue: Secondary | ICD-10-CM

## 2021-08-31 DIAGNOSIS — Z3041 Encounter for surveillance of contraceptive pills: Secondary | ICD-10-CM

## 2021-08-31 LAB — COMPREHENSIVE METABOLIC PANEL
AG Ratio: 1.5 (calc) (ref 1.0–2.5)
ALT: 43 U/L — ABNORMAL HIGH (ref 6–29)
AST: 35 U/L — ABNORMAL HIGH (ref 10–30)
Albumin: 4.3 g/dL (ref 3.6–5.1)
Alkaline phosphatase (APISO): 85 U/L (ref 31–125)
BUN: 12 mg/dL (ref 7–25)
CO2: 28 mmol/L (ref 20–32)
Calcium: 9.5 mg/dL (ref 8.6–10.2)
Chloride: 102 mmol/L (ref 98–110)
Creat: 0.92 mg/dL (ref 0.50–0.96)
Globulin: 2.8 g/dL (calc) (ref 1.9–3.7)
Glucose, Bld: 112 mg/dL (ref 65–139)
Potassium: 4.9 mmol/L (ref 3.5–5.3)
Sodium: 137 mmol/L (ref 135–146)
Total Bilirubin: 0.5 mg/dL (ref 0.2–1.2)
Total Protein: 7.1 g/dL (ref 6.1–8.1)

## 2021-08-31 LAB — IRON,TIBC AND FERRITIN PANEL
%SAT: 17 % (calc) (ref 16–45)
Ferritin: 9 ng/mL — ABNORMAL LOW (ref 16–154)
Iron: 74 ug/dL (ref 40–190)
TIBC: 434 mcg/dL (calc) (ref 250–450)

## 2021-08-31 LAB — CBC WITH DIFFERENTIAL/PLATELET
Absolute Monocytes: 356 cells/uL (ref 200–950)
Basophils Absolute: 38 cells/uL (ref 0–200)
Basophils Relative: 0.7 %
Eosinophils Absolute: 189 cells/uL (ref 15–500)
Eosinophils Relative: 3.5 %
HCT: 41.2 % (ref 35.0–45.0)
Hemoglobin: 14 g/dL (ref 11.7–15.5)
Lymphs Abs: 1706 cells/uL (ref 850–3900)
MCH: 31.7 pg (ref 27.0–33.0)
MCHC: 34 g/dL (ref 32.0–36.0)
MCV: 93.2 fL (ref 80.0–100.0)
MPV: 10 fL (ref 7.5–12.5)
Monocytes Relative: 6.6 %
Neutro Abs: 3110 cells/uL (ref 1500–7800)
Neutrophils Relative %: 57.6 %
Platelets: 339 10*3/uL (ref 140–400)
RBC: 4.42 10*6/uL (ref 3.80–5.10)
RDW: 13 % (ref 11.0–15.0)
Total Lymphocyte: 31.6 %
WBC: 5.4 10*3/uL (ref 3.8–10.8)

## 2021-08-31 LAB — VITAMIN B12: Vitamin B-12: 1618 pg/mL — ABNORMAL HIGH (ref 200–1100)

## 2021-08-31 LAB — TSH+FREE T4: TSH W/REFLEX TO FT4: 0.82 mIU/L

## 2021-08-31 LAB — LIPASE: Lipase: 75 U/L — ABNORMAL HIGH (ref 7–60)

## 2021-08-31 LAB — AMYLASE: Amylase: 68 U/L (ref 21–101)

## 2021-08-31 MED ORDER — HYDROXYZINE HCL 10 MG PO TABS: 10.0000 mg | ORAL_TABLET | Freq: Every day | ORAL | 3 refills | Status: DC

## 2021-08-31 MED ORDER — NORGESTIMATE-ETH ESTRADIOL 0.25-35 MG-MCG PO TABS: ORAL_TABLET | ORAL | 4 refills | Status: DC

## 2021-08-31 NOTE — Progress Notes (Signed)
History was provided by the patient.  Kelli Davis is a 21 y.o. female who is here for anxiety, depression, grief, T1DM, sleep issues, ocp.  [name], FNP   HPI:  Pt reports she is working 2 days a week on Mon and Tues and not having to work with her mom which is good. Diabetes care is going well overall but yesterday was not as good with her first day back at work. Was eating 830, 1230 and 630 pm on schedule but yesterday didn't eat until 3 pm and 9 pm. Been cooking and learning how to do this. Getting things she likes and trying to have well balanced meals. Still wearing her dexcom and got it back on her arm which used to be hard.   She has been waking on a schedule and not napping during the day. She does still feel quite tired.   Sleeping better but nightmares are increasing. Hasn't been taking the prazosin recently but plans to restart.   PHQ-SADS Last 3 Score only 08/31/2021 07/19/2021 05/05/2020  PHQ-15 Score 5 10 7   Total GAD-7 Score 10 19 6   PHQ Adolescent Score 5 20 5       No LMP recorded (lmp unknown). (Menstrual status: Oral contraceptives).   Patient Active Problem List   Diagnosis Date Noted   Current moderate episode of major depressive disorder without prior episode (HCC) 05/25/2021   Vitamin D deficiency 05/05/2020   Insulin dose changed (HCC) 09/03/2018   Mixed hyperlipidemia 09/03/2018   Generalized anxiety disorder 09/04/2017   Oral contraceptive pill surveillance 09/04/2017   Eating disorder 05/16/2016   Insomnia 04/12/2016   ADHD (attention deficit hyperactivity disorder), combined type 05/20/2015   Maladaptive health behaviors affecting medical condition 10/17/2013   Hypoglycemia associated with diabetes (HCC)    Uncontrolled type 1 diabetes mellitus with hyperglycemia (HCC) 12/13/2010    Current Outpatient Medications on File Prior to Visit  Medication Sig Dispense Refill   ACCU-CHEK FASTCLIX LANCETS MISC Check sugar 10 x daily 300 each 3    buPROPion (WELLBUTRIN XL) 300 MG 24 hr tablet TAKE 1 TABLET BY MOUTH EVERY DAY 90 tablet 1   Continuous Blood Gluc Sensor (DEXCOM G6 SENSOR) MISC 1 APPLICATION BY DOES NOT APPLY ROUTE AS NEEDED. 3 each 5   Continuous Blood Gluc Transmit (DEXCOM G6 TRANSMITTER) MISC 1 application by Does not apply route continuous as needed. 1 each 3   escitalopram (LEXAPRO) 20 MG tablet TAKE 1 TABLET BY MOUTH EVERY DAY 90 tablet 1   fluconazole (DIFLUCAN) 150 MG tablet Take 150 mg by mouth daily.     fluticasone (FLONASE) 50 MCG/ACT nasal spray 1 spray daily as needed for allergies.     Glucagon (BAQSIMI ONE PACK) 3 MG/DOSE POWD Place 1 Dose into the nose as needed. (Patient taking differently: Place 1 Dose into the nose as needed (low blood sugar).) 2 each 1   glucose blood test strip Use as instructed 300 each 3   hydrOXYzine (ATARAX/VISTARIL) 10 MG tablet Take 1 tablet (10 mg total) by mouth 3 (three) times daily as needed. (Patient taking differently: Take 10 mg by mouth 3 (three) times daily as needed for anxiety.) 30 tablet 3   insulin aspart (NOVOLOG FLEXPEN) 100 UNIT/ML FlexPen INJECT UP TO 50 UNITS SUBCUTANEOUSLY DAILY (Patient taking differently: 0-50 Units 3 (three) times daily with meals. Per sliding scale) 15 mL 5   insulin degludec (TRESIBA FLEXTOUCH) 100 UNIT/ML FlexTouch Pen Inject 30 Units into the skin at bedtime. Inject  up to 50 units per day SubQ (Patient taking differently: Inject 30 Units into the skin at bedtime.)     Insulin Pen Needle (BD PEN NEEDLE NANO U/F) 32G X 4 MM MISC Use with insulin pens 6x daily 200 each 6   norgestimate-ethinyl estradiol (SPRINTEC 28) 0.25-35 MG-MCG tablet TAKE 1 TABLET DAILY. DISCARD PLACEBOS AND TAKE ACTIVE PILLS FOR CONTINUOUS CYCLING (Patient taking differently: Take 1 tablet by mouth every evening.) 112 tablet 4   ondansetron (ZOFRAN) 8 MG tablet Take 1 tablet (8 mg total) by mouth every 8 (eight) hours as needed for nausea or vomiting. 20 tablet 0    pantoprazole (PROTONIX) 40 MG tablet Take 1 tablet (40 mg total) by mouth daily. 30 tablet 2   prazosin (MINIPRESS) 2 MG capsule TAKE 1 CAPSULE BY MOUTH AT BEDTIME. 90 capsule 1   No current facility-administered medications on file prior to visit.    Allergies  Allergen Reactions   Penicillins Hives    Physical Exam:    Vitals:   08/31/21 1057  BP: 115/72  Pulse: 96  Weight: 123 lb (55.8 kg)    Growth percentile SmartLinks can only be used for patients less than 22 years old.  Physical Exam Vitals and nursing note reviewed.  Constitutional:      General: She is not in acute distress.    Appearance: She is well-developed.  Neck:     Thyroid: No thyromegaly.  Cardiovascular:     Rate and Rhythm: Normal rate and regular rhythm.     Heart sounds: No murmur heard. Pulmonary:     Breath sounds: Normal breath sounds.  Abdominal:     Palpations: Abdomen is soft. There is no mass.     Tenderness: There is no abdominal tenderness. There is no guarding.  Musculoskeletal:     Right lower leg: No edema.     Left lower leg: No edema.  Lymphadenopathy:     Cervical: No cervical adenopathy.  Skin:    General: Skin is warm.     Capillary Refill: Capillary refill takes less than 2 seconds.     Findings: No rash.  Neurological:     Mental Status: She is alert.     Comments: No tremor  Psychiatric:        Mood and Affect: Mood normal.    Assessment/Plan: 1. Current moderate episode of major depressive disorder without prior episode (HCC) Much improvement in depressive symptoms since starting better self care routine and taking medications regularly. She has not heard back from [facility]- I sent message to Lysle Rubensshton Hicks to see if any availability with her. I will let pt know.   2. ADHD (attention deficit hyperactivity disorder), combined type Doing well on wellbutrin.   3. Generalized anxiety disorder Modest improvements over the last month with no panic attacks.   4.  Anorexia nervosa, restricting type Will repeat some labs today that were abnormal inpatient and check thyroid, iron panel and b12 related to T1DM and ongoing fatigue despite improving self care.  - Comprehensive metabolic panel - Amylase - Lipase - CBC with Differential/Platelet - Iron, TIBC and Ferritin Panel - TSH + free T4 - B12  5. Uncontrolled type 1 diabetes mellitus with hyperglycemia (HCC) Doing better with diabetes care. Has f/u with endo on Thursday.  - Comprehensive metabolic panel - Amylase - Lipase - CBC with Differential/Platelet - Iron, TIBC and Ferritin Panel - TSH + free T4 - B12  6. Insomnia, unspecified type Overall better, plans  to restart prazosin for nightmares.   7. Other fatigue As above.  - Iron, TIBC and Ferritin Panel - TSH + free T4 - B12  Return in 2 weeks for close follow up   Alfonso Ramus, FNP

## 2021-09-01 ENCOUNTER — Other Ambulatory Visit: Payer: Self-pay | Admitting: Pediatrics

## 2021-09-01 MED ORDER — POLYSACCHARIDE-IRON COMPLEX 150 MG PO CAPS: 1.0000 | ORAL_CAPSULE | Freq: Every day | ORAL | 3 refills | Status: DC

## 2021-09-02 ENCOUNTER — Ambulatory Visit (INDEPENDENT_AMBULATORY_CARE_PROVIDER_SITE_OTHER): Payer: BC Managed Care – PPO | Admitting: Family

## 2021-09-02 NOTE — Progress Notes (Deleted)
Pediatric Endocrinology Diabetes Consultation Follow-up Visit  [name] 02/13/2001 [id number]  Chief Complaint: Follow-up type 1 diabetes   Verneda SkillHacker, Caroline T, FNP   HPI: Kelli Davis  is a 21 y.o. female presenting for follow-up of type 1 diabetes. she is accompanied to this visit by her mother and father.   1. Kelli Davis was diagnosed with type 1 diabetes at age 89. At that time she was hospitalized at [facility] and was in DKA. She was in the ICU for 2 days. She was initially followed by Dr. Langston MaskerMorris in [place] but transferred to this clinic after Dr. Langston MaskerMorris retired. She has been admitted in DKA two additional times since diagnosis. She has been on pump therapy since age 89.  2. Since last visit to [facility] on 08/2021, she has been  Kelli Davis has been living with her sister lately reports not much contact with mom and dad.   Kelli Davis reports that she has not been taking care of herself, she realized this after her third admission. She states that she was sleeping all the time and not taking her insulin or checking her blood sugar. She is considering inpatient health care. She has looked into [facility] which is for eating disorders. She states that she has not been eating well but has been trying to improve her eating since discharge from hospital. She feels like it is a constant fight to make herself eat and she needs additional help.  She has already contacted [facility] and is going to do admission test tomorrow.   Since discharge from her last hospitalization she has been taking her insulin more consistently. She is giving [place]iba in the morning, 30 units per day. She reports taking Novolog 5 x per day, usually 10 units per meal. She is currently doing finger stick blood sugars but plans to restart Dexcom CGM. She would like closed loop insulin pump but currently it is cost prohibitive for her.   She reports taking Wellbutrin and lexapro consistently.   Insulin  regimen: 30  units of Tresiba. Novolog 120/30/5 Hypoglycemia: Able to feel low blood sugars.  No glucagon needed recently.  Dexcom CGm Download  Not wearing Dexcom CGM currently. Plans to get refills this week.  Blood glucose download shows she is checking 4-8 x per day  - Avg Bg 180 - In target range 50% of the time, above target 46% - Download begins on 08/11/2021 after discharge  Med-alert ID: Not currently wearing. Injection sites: arms and hips  Annual labs due:  Ophthalmology due: 2019. Discussed importance of annual dilated eye exam.     3. ROS: Greater than 10 systems reviewed with pertinent positives listed in HPI, otherwise neg. Constitutional: Reports frequent sleeping. + weight gain since discharge from hospital.  Eyes: No changes in vision, denies blurry vision. Wears glasses.  Ears/Nose/Mouth/Throat: No difficulty swallowing. No neck pain  Cardiovascular: No palpitations, denies tachycardia  Respiratory: No increased work of breathing, No SOB  Gastrointestinal: No constipation or diarrhea. No abdominal pain Genitourinary: No nocturia, no polyuria Endocrine: No polydipsia.  No hyperpigmentation Psychiatric: Normal affect.+ anxiety and depression. Currently on Lexapro.   Past Medical History:   Past Medical History:  Diagnosis Date   ADD (attention deficit disorder)    Eating disorder    Febrile seizure (HCC)    History of eye surgery    Physical growth delay    Type 1 diabetes mellitus not at goal [facility](HCC)     Medications:  Outpatient Encounter Medications as  of 09/02/2021  Medication Sig   ACCU-CHEK FASTCLIX LANCETS MISC Check sugar 10 x daily   buPROPion (WELLBUTRIN XL) 300 MG 24 hr tablet TAKE 1 TABLET BY MOUTH EVERY DAY   Continuous Blood Gluc Sensor (DEXCOM G6 SENSOR) MISC 1 APPLICATION BY DOES NOT APPLY ROUTE AS NEEDED.   Continuous Blood Gluc Transmit (DEXCOM G6 TRANSMITTER) MISC 1 application by Does not apply route continuous as needed.   escitalopram  (LEXAPRO) 20 MG tablet TAKE 1 TABLET BY MOUTH EVERY DAY   fluticasone (FLONASE) 50 MCG/ACT nasal spray 1 spray daily as needed for allergies.   Glucagon (BAQSIMI ONE PACK) 3 MG/DOSE POWD Place 1 Dose into the nose as needed. (Patient taking differently: Place 1 Dose into the nose as needed (low blood sugar).)   glucose blood test strip Use as instructed   hydrOXYzine (ATARAX) 10 MG tablet Take 1 tablet (10 mg total) by mouth at bedtime.   insulin aspart (NOVOLOG FLEXPEN) 100 UNIT/ML FlexPen INJECT UP TO 50 UNITS SUBCUTANEOUSLY DAILY (Patient taking differently: 0-50 Units 3 (three) times daily with meals. Per sliding scale)   insulin degludec (TRESIBA FLEXTOUCH) 100 UNIT/ML FlexTouch Pen Inject 30 Units into the skin at bedtime. Inject up to 50 units per day SubQ (Patient taking differently: Inject 30 Units into the skin at bedtime.)   Insulin Pen Needle (BD PEN NEEDLE NANO U/F) 32G X 4 MM MISC Use with insulin pens 6x daily   norgestimate-ethinyl estradiol (SPRINTEC 28) 0.25-35 MG-MCG tablet TAKE 1 TABLET DAILY. DISCARD PLACEBOS AND TAKE ACTIVE PILLS FOR CONTINUOUS CYCLING   pantoprazole (PROTONIX) 40 MG tablet Take 1 tablet (40 mg total) by mouth daily.   Polysaccharide-Iron Complex 150 MG CAPS Take 1 capsule by mouth daily.   prazosin (MINIPRESS) 2 MG capsule TAKE 1 CAPSULE BY MOUTH AT BEDTIME.   No facility-administered encounter medications on file as of 09/02/2021.    Allergies: Allergies  Allergen Reactions   Penicillins Hives    Surgical History: Past Surgical History:  Procedure Laterality Date   EYE MUSCLE SURGERY      Family History:  Family History  Problem Relation Age of Onset   Cancer Maternal Grandmother    Hypertension Maternal Grandfather    Depression Maternal Grandfather    Diabetes Paternal Grandfather    Anxiety disorder Father    Depression Mother    Anxiety disorder Sister      Social History: Lives with: Living with friends.    Physical Exam:   There were no vitals filed for this visit.     LMP  (LMP Unknown)  Body mass index: body mass index is unknown because there is no height or weight on file. Growth percentile SmartLinks can only be used for patients less than 71 years old.  Ht Readings from Last 3 Encounters:  08/19/21 4' 11.55" (1.513 m)  08/09/21 4\' 11"  (1.499 m)  07/28/21 4\' 11"  (1.499 m)   Wt Readings from Last 3 Encounters:  08/31/21 123 lb (55.8 kg)  08/19/21 124 lb (56.2 kg)  08/19/21 133 lb (60.3 kg)   Physical Exam  General: Well developed, well nourished female in no acute distress.   Head: Normocephalic, atraumatic.   Eyes:  Pupils equal and round. EOMI.   Sclera white.  No eye drainage.   Ears/Nose/Mouth/Throat: Nares patent, no nasal drainage.  Normal dentition, mucous membranes moist.   Neck: supple, no cervical lymphadenopathy, no thyromegaly Cardiovascular: regular rate, normal S1/S2, no murmurs Respiratory: No increased work of breathing.  Lungs clear to auscultation bilaterally.  No wheezes. Abdomen: soft, nontender, nondistended. Normal bowel sounds.  No appreciable masses  Extremities: warm, well perfused, cap refill < 2 sec.   Musculoskeletal: Normal muscle mass.  Normal strength Skin: warm, dry.  No rash or lesions. Neurologic: alert and oriented, normal speech, no tremor   Labs:    Assessment/Plan: Annice is a 21 y.o. female with type 1 diabetes on MDI and CGM therapy. She has severely struggled with her health. 3 admission in one month for DKA due to noncompliance which is likely due to depression. Concerns about exacerbation of eating disorder per her most recent reports. She would greatly benefit from admission to inpatient psychiatric care and having the option for help with eating disorder would be very helpful. Her diabetes care has improved over the past 2 weeks since discharge but she will need very close supervision to ensure success.    1-3. DM w/o complication type I,  uncontrolled (HCC)/hyperglycemia/Insulin dose change  - 30 units of Tresiba   - Novolog 120/30/5 plan   - At bedtime and 2am: 1 unit for every 50 points >150 and 1 unit for every 10 grams of carbs.  - Reviewed meter and CGM download. Discussed trends and patterns.  - Rotate injection sites to prevent scar tissue.  - bolus 15 minutes prior to eating to limit blood sugar spikes.  - Reviewed carb counting and importance of accurate carb counting.  - Discussed signs and symptoms of hypoglycemia. Always have glucose available.  - POCT glucose and hemoglobin A1c  - Reviewed growth chart.    4. Disordered eating - Discussed effects of binging/purging on blood sugars.  - I agree with admission to [facility] and support her decision for seeking help.   5. Depression/Anxiety  - Close follow up with [facility].   - Continue Wellbutrin and Lexapro   6. Mixed hyperlipidemia  - Take 2000 units of fish oil daily.  - Low cholesterol diet.  - Fast lipid panel twice per year.     Follow-up: 2 weeks.    LOS: >45 spent today reviewing the medical chart, counseling the patient/family, and documenting today's visit.  When a patient is on insulin, intensive monitoring of blood glucose levels is necessary to avoid hyperglycemia and hypoglycemia. Severe hyperglycemia/hypoglycemia can lead to hospital admissions and be life threatening.     Gretchen Short,  FNP-C  Pediatric Specialist  [address]  [place], [address]  Tele: [phone]

## 2021-09-16 ENCOUNTER — Ambulatory Visit: Payer: BC Managed Care – PPO | Admitting: Pediatrics

## 2021-09-28 ENCOUNTER — Other Ambulatory Visit: Payer: Self-pay | Admitting: Pediatrics

## 2021-09-28 DIAGNOSIS — G4700 Insomnia, unspecified: Secondary | ICD-10-CM

## 2021-09-29 ENCOUNTER — Encounter (INDEPENDENT_AMBULATORY_CARE_PROVIDER_SITE_OTHER): Payer: Self-pay

## 2021-11-22 ENCOUNTER — Encounter (HOSPITAL_COMMUNITY): Payer: Self-pay

## 2021-11-22 ENCOUNTER — Inpatient Hospital Stay (HOSPITAL_COMMUNITY)
Admission: EM | Admit: 2021-11-22 | Discharge: 2021-11-25 | DRG: 638 | Disposition: A | Discharge status: Home / Self Care | Payer: BC Managed Care – PPO | Attending: Internal Medicine | Admitting: Internal Medicine

## 2021-11-22 ENCOUNTER — Other Ambulatory Visit: Payer: Self-pay

## 2021-11-22 DIAGNOSIS — R000 Tachycardia, unspecified: Secondary | ICD-10-CM | POA: Diagnosis not present

## 2021-11-22 DIAGNOSIS — Z794 Long term (current) use of insulin: Secondary | ICD-10-CM | POA: Diagnosis not present

## 2021-11-22 DIAGNOSIS — F509 Eating disorder, unspecified: Secondary | ICD-10-CM | POA: Diagnosis not present

## 2021-11-22 DIAGNOSIS — E860 Dehydration: Secondary | ICD-10-CM | POA: Diagnosis present

## 2021-11-22 DIAGNOSIS — Z79899 Other long term (current) drug therapy: Secondary | ICD-10-CM | POA: Diagnosis not present

## 2021-11-22 DIAGNOSIS — T383X6A Underdosing of insulin and oral hypoglycemic [antidiabetic] drugs, initial encounter: Secondary | ICD-10-CM | POA: Diagnosis present

## 2021-11-22 DIAGNOSIS — N920 Excessive and frequent menstruation with regular cycle: Secondary | ICD-10-CM | POA: Diagnosis present

## 2021-11-22 DIAGNOSIS — E876 Hypokalemia: Secondary | ICD-10-CM | POA: Diagnosis not present

## 2021-11-22 DIAGNOSIS — E875 Hyperkalemia: Secondary | ICD-10-CM | POA: Diagnosis not present

## 2021-11-22 DIAGNOSIS — E871 Hypo-osmolality and hyponatremia: Secondary | ICD-10-CM | POA: Diagnosis not present

## 2021-11-22 DIAGNOSIS — D500 Iron deficiency anemia secondary to blood loss (chronic): Secondary | ICD-10-CM | POA: Diagnosis not present

## 2021-11-22 DIAGNOSIS — Z818 Family history of other mental and behavioral disorders: Secondary | ICD-10-CM

## 2021-11-22 DIAGNOSIS — Z8249 Family history of ischemic heart disease and other diseases of the circulatory system: Secondary | ICD-10-CM

## 2021-11-22 DIAGNOSIS — Z833 Family history of diabetes mellitus: Secondary | ICD-10-CM | POA: Diagnosis not present

## 2021-11-22 DIAGNOSIS — Z880 Allergy status to penicillin: Secondary | ICD-10-CM

## 2021-11-22 DIAGNOSIS — E1110 Type 2 diabetes mellitus with ketoacidosis without coma: Secondary | ICD-10-CM | POA: Diagnosis not present

## 2021-11-22 DIAGNOSIS — F32A Depression, unspecified: Secondary | ICD-10-CM | POA: Diagnosis not present

## 2021-11-22 DIAGNOSIS — E1010 Type 1 diabetes mellitus with ketoacidosis without coma: Principal | ICD-10-CM

## 2021-11-22 DIAGNOSIS — F988 Other specified behavioral and emotional disorders with onset usually occurring in childhood and adolescence: Secondary | ICD-10-CM | POA: Diagnosis not present

## 2021-11-22 DIAGNOSIS — N179 Acute kidney failure, unspecified: Secondary | ICD-10-CM | POA: Diagnosis present

## 2021-11-22 DIAGNOSIS — E1165 Type 2 diabetes mellitus with hyperglycemia: Secondary | ICD-10-CM | POA: Diagnosis not present

## 2021-11-22 DIAGNOSIS — F320 Major depressive disorder, single episode, mild: Secondary | ICD-10-CM | POA: Diagnosis not present

## 2021-11-22 DIAGNOSIS — F419 Anxiety disorder, unspecified: Secondary | ICD-10-CM | POA: Diagnosis present

## 2021-11-22 DIAGNOSIS — R6250 Unspecified lack of expected normal physiological development in childhood: Secondary | ICD-10-CM | POA: Diagnosis present

## 2021-11-22 DIAGNOSIS — Z638 Other specified problems related to primary support group: Secondary | ICD-10-CM

## 2021-11-22 LAB — CBC WITH DIFFERENTIAL/PLATELET
Abs Immature Granulocytes: 0.05 10*3/uL (ref 0.00–0.07)
Basophils Absolute: 0.1 10*3/uL (ref 0.0–0.1)
Basophils Relative: 1 %
Eosinophils Absolute: 0 10*3/uL (ref 0.0–0.5)
Eosinophils Relative: 0 %
HCT: 53.6 % — ABNORMAL HIGH (ref 36.0–46.0)
Hemoglobin: 18 g/dL — ABNORMAL HIGH (ref 12.0–15.0)
Immature Granulocytes: 1 %
Lymphocytes Relative: 15 %
Lymphs Abs: 1.3 10*3/uL (ref 0.7–4.0)
MCH: 30.5 pg (ref 26.0–34.0)
MCHC: 33.6 g/dL (ref 30.0–36.0)
MCV: 90.7 fL (ref 80.0–100.0)
Monocytes Absolute: 0.2 10*3/uL (ref 0.1–1.0)
Monocytes Relative: 2 %
Neutro Abs: 7.2 10*3/uL (ref 1.7–7.7)
Neutrophils Relative %: 81 %
Platelets: 408 10*3/uL — ABNORMAL HIGH (ref 150–400)
RBC: 5.91 MIL/uL — ABNORMAL HIGH (ref 3.87–5.11)
RDW: 11.9 % (ref 11.5–15.5)
WBC: 8.8 10*3/uL (ref 4.0–10.5)
nRBC: 0 % (ref 0.0–0.2)

## 2021-11-22 LAB — BASIC METABOLIC PANEL
Anion gap: 30 — ABNORMAL HIGH (ref 5–15)
BUN: 14 mg/dL (ref 6–20)
BUN: 14 mg/dL (ref 6–20)
CO2: <7 mmol/L — ABNORMAL LOW (ref 22–32)
CO2: 7 mmol/L — ABNORMAL LOW (ref 22–32)
Calcium: 7.6 mg/dL — ABNORMAL LOW (ref 8.9–10.3)
Calcium: 9.2 mg/dL (ref 8.9–10.3)
Chloride: 109 mmol/L (ref 98–111)
Chloride: 98 mmol/L (ref 98–111)
Creatinine, Ser: 1.51 mg/dL — ABNORMAL HIGH (ref 0.44–1.00)
Creatinine, Ser: 1.69 mg/dL — ABNORMAL HIGH (ref 0.44–1.00)
GFR, Estimated: 50 mL/min — ABNORMAL LOW (ref >60)
GFR, Estimated: 44 mL/min — ABNORMAL LOW (ref >60)
Glucose, Bld: 354 mg/dL — ABNORMAL HIGH (ref 70–99)
Glucose, Bld: 587 mg/dL (ref 70–99)
Potassium: 4.1 mmol/L (ref 3.5–5.1)
Potassium: 5.2 mmol/L — ABNORMAL HIGH (ref 3.5–5.1)
Sodium: 139 mmol/L (ref 135–145)
Sodium: 135 mmol/L (ref 135–145)

## 2021-11-22 LAB — OSMOLALITY: Osmolality: 334 mOsm/kg (ref 275–295)

## 2021-11-22 LAB — I-STAT VENOUS BLOOD GAS, ED
Acid-base deficit: 20 mmol/L — ABNORMAL HIGH (ref 0.0–2.0)
Bicarbonate: 7.4 mmol/L — ABNORMAL LOW (ref 20.0–28.0)
Calcium, Ion: 1.08 mmol/L — ABNORMAL LOW (ref 1.15–1.40)
HCT: 54 % — ABNORMAL HIGH (ref 36.0–46.0)
Hemoglobin: 18.4 g/dL — ABNORMAL HIGH (ref 12.0–15.0)
O2 Saturation: 81 %
Potassium: 5.2 mmol/L — ABNORMAL HIGH (ref 3.5–5.1)
Sodium: 132 mmol/L — ABNORMAL LOW (ref 135–145)
TCO2: 8 mmol/L — ABNORMAL LOW (ref 22–32)
pCO2, Ven: 23.5 mmHg — ABNORMAL LOW (ref 44–60)
pH, Ven: 7.108 — CL (ref 7.25–7.43)
pO2, Ven: 60 mmHg — ABNORMAL HIGH (ref 32–45)

## 2021-11-22 LAB — URINALYSIS, ROUTINE W REFLEX MICROSCOPIC
Bacteria, UA: NONE SEEN
Bilirubin Urine: NEGATIVE
Glucose, UA: >=500 mg/dL — AB
Ketones, ur: 80 mg/dL — AB
Leukocytes,Ua: NEGATIVE
Nitrite: NEGATIVE
Protein, ur: NEGATIVE mg/dL
Specific Gravity, Urine: 1.025 (ref 1.005–1.030)
pH: 5 (ref 5.0–8.0)

## 2021-11-22 LAB — CBG MONITORING, ED
Glucose-Capillary: 217 mg/dL — ABNORMAL HIGH (ref 70–99)
Glucose-Capillary: 246 mg/dL — ABNORMAL HIGH (ref 70–99)
Glucose-Capillary: 280 mg/dL — ABNORMAL HIGH (ref 70–99)
Glucose-Capillary: 337 mg/dL — ABNORMAL HIGH (ref 70–99)
Glucose-Capillary: 384 mg/dL — ABNORMAL HIGH (ref 70–99)
Glucose-Capillary: 447 mg/dL — ABNORMAL HIGH (ref 70–99)
Glucose-Capillary: 559 mg/dL (ref 70–99)
Glucose-Capillary: >600 mg/dL (ref 70–99)

## 2021-11-22 LAB — I-STAT BETA HCG BLOOD, ED (MC, WL, AP ONLY): I-stat hCG, quantitative: <5 m[IU]/mL (ref <5)

## 2021-11-22 MED ORDER — ONDANSETRON HCL 4 MG/2ML IJ SOLN: 4.0000 mg | Freq: Once | INTRAMUSCULAR | Status: DC

## 2021-11-22 MED ORDER — LACTATED RINGERS IV SOLN: INTRAVENOUS | Status: DC

## 2021-11-22 MED ORDER — LACTATED RINGERS IV BOLUS
1000.0000 mL | Freq: Once | INTRAVENOUS | Status: AC
  Administered 2021-11-22: 1000 mL via INTRAVENOUS

## 2021-11-22 MED ORDER — SODIUM CHLORIDE 0.9 % IV BOLUS
1000.0000 mL | Freq: Once | INTRAVENOUS | Status: AC
  Administered 2021-11-22: 1000 mL via INTRAVENOUS

## 2021-11-22 MED ORDER — INSULIN REGULAR(HUMAN) IN NACL 100-0.9 UT/100ML-% IV SOLN
INTRAVENOUS | Status: DC
  Administered 2021-11-22: 5 [IU]/h via INTRAVENOUS
  Filled 2021-11-22: qty 100

## 2021-11-22 MED ORDER — ONDANSETRON HCL 4 MG/2ML IJ SOLN
4.0000 mg | Freq: Once | INTRAMUSCULAR | Status: AC
  Administered 2021-11-22: 4 mg via INTRAVENOUS
  Filled 2021-11-22: qty 2

## 2021-11-22 MED ORDER — PROCHLORPERAZINE EDISYLATE 10 MG/2ML IJ SOLN
10.0000 mg | Freq: Once | INTRAMUSCULAR | Status: AC
  Administered 2021-11-22: 10 mg via INTRAVENOUS
  Filled 2021-11-22: qty 2

## 2021-11-22 MED ORDER — DEXTROSE 50 % IV SOLN: 0.0000 mL | INTRAVENOUS | Status: DC | PRN

## 2021-11-22 MED ORDER — LORAZEPAM 2 MG/ML IJ SOLN
0.5000 mg | Freq: Once | INTRAMUSCULAR | Status: AC
  Administered 2021-11-22: 0.5 mg via INTRAVENOUS
  Filled 2021-11-22: qty 1

## 2021-11-22 MED ORDER — PANTOPRAZOLE SODIUM 40 MG PO TBEC
40.0000 mg | DELAYED_RELEASE_TABLET | Freq: Every day | ORAL | Status: DC
  Administered 2021-11-22 – 2021-11-25 (×4): 40 mg via ORAL
  Filled 2021-11-22 (×4): qty 1

## 2021-11-22 MED ORDER — DEXTROSE IN LACTATED RINGERS 5 % IV SOLN: INTRAVENOUS | Status: DC

## 2021-11-22 MED ORDER — BUPROPION HCL ER (XL) 300 MG PO TB24
300.0000 mg | ORAL_TABLET | Freq: Every day | ORAL | Status: DC
  Administered 2021-11-23 – 2021-11-25 (×3): 300 mg via ORAL
  Filled 2021-11-22 (×3): qty 1

## 2021-11-22 MED ORDER — SODIUM CHLORIDE 0.9 % IV BOLUS
1000.0000 mL | INTRAVENOUS | Status: AC
  Administered 2021-11-22 (×2): 1000 mL via INTRAVENOUS

## 2021-11-22 MED ORDER — LACTATED RINGERS IV BOLUS: 20.0000 mL/kg | Freq: Once | INTRAVENOUS | Status: DC

## 2021-11-22 MED ORDER — ESCITALOPRAM OXALATE 10 MG PO TABS
20.0000 mg | ORAL_TABLET | Freq: Every day | ORAL | Status: DC
  Administered 2021-11-23 – 2021-11-25 (×3): 20 mg via ORAL
  Filled 2021-11-22 (×3): qty 2

## 2021-11-22 MED ORDER — PRAZOSIN HCL 2 MG PO CAPS
2.0000 mg | ORAL_CAPSULE | Freq: Every day | ORAL | Status: DC
  Administered 2021-11-22 – 2021-11-24 (×3): 2 mg via ORAL
  Filled 2021-11-22 (×3): qty 1

## 2021-11-22 MED ORDER — POLYSACCHARIDE IRON COMPLEX 150 MG PO CAPS
150.0000 mg | ORAL_CAPSULE | Freq: Every day | ORAL | Status: DC
  Administered 2021-11-22 – 2021-11-25 (×4): 150 mg via ORAL
  Filled 2021-11-22 (×4): qty 1

## 2021-11-22 MED ORDER — HYDROXYZINE HCL 10 MG PO TABS
10.0000 mg | ORAL_TABLET | Freq: Every day | ORAL | Status: DC
  Administered 2021-11-22 – 2021-11-24 (×3): 10 mg via ORAL
  Filled 2021-11-22 (×4): qty 1

## 2021-11-22 NOTE — H&P (Signed)
?History and Physical  ? ? ?Kelli Davis ? ?PCP: Verneda SkillHacker, Caroline T, FNP (Confirm with patient/family/NH records and if not entered, [facility]) ?Patient coming from: Home ? ?I have personally briefly reviewed patient's [facility] ? ?Chief Complaint: I did not take my insulin ? ?HPI: Kelli Davis is a [age] y.o. 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 ? ?Patient reported that she had a quarry with family member and she left home two nights ago, but she forgot to take her insulin with her. Started from yesterday she developed epigastric cramping, frequent nauseous vomiting up stomach content, normal bowel nonbloody.  She has not been able to eat or drink since yesterday because of GI symptoms.  Denies any fever or chills.  Patient denies any suicidal ideation or plan at this point. ? ?ED Course: Tachycardia, glucose> 600, VBG showed pH 7.1, bicarb 7. ? ?Patient received 2 L IV fluid and insulin drip started in the ED. ? ?Review of Systems: As per HPI otherwise 14 point review of systems negative.  ? ? ?Past Medical History:  ?Diagnosis Date  ? ADD (attention deficit disorder)   ? Eating disorder   ? Febrile seizure (HCC)   ? History of eye surgery   ? Physical growth delay   ? Type 1 diabetes mellitus not at goal [facility](HCC)   ? ? ?Past Surgical History:  ?Procedure Laterality Date  ? EYE MUSCLE SURGERY    ? ? ? reports that she has never smoked. She has been exposed to tobacco smoke. She has never used smokeless tobacco. She reports current alcohol use. She reports that she does not use drugs. ? ?Allergies  ?Allergen Reactions  ? Penicillins Hives  ? ? ?Family History  ?Problem Relation Age of Onset  ? Cancer Maternal Grandmother   ? Hypertension Maternal Grandfather   ? Depression Maternal Grandfather   ?  Diabetes Paternal Grandfather   ? Anxiety disorder Father   ? Depression Mother   ? Anxiety disorder Sister   ? ? ? ?Prior to Admission medications   ?Medication Sig Start Date End Date Taking? Authorizing Provider  ?ACCU-CHEK FASTCLIX LANCETS MISC Check sugar 10 x daily 10/21/15   Gretchen ShortBeasley, [name], NP  ?buPROPion (WELLBUTRIN XL) 300 MG 24 hr tablet TAKE 1 TABLET BY MOUTH EVERY DAY 05/26/21   Verneda SkillHacker, Caroline T, FNP  ?Continuous Blood Gluc Sensor (DEXCOM G6 SENSOR) MISC 1 APPLICATION BY DOES NOT APPLY ROUTE AS NEEDED. 01/05/21   Gretchen ShortBeasley, [name], NP  ?Continuous Blood Gluc Transmit (DEXCOM G6 TRANSMITTER) MISC 1 application by Does not apply route continuous as needed. 07/23/20   Gretchen ShortBeasley, [name], NP  ?escitalopram (LEXAPRO) 20 MG tablet TAKE 1 TABLET BY MOUTH EVERY DAY 08/11/21   Georges MouseJones, Christy M, NP  ?fluticasone (FLONASE) 50 MCG/ACT nasal spray 1 spray daily as needed for allergies. 02/06/19   [provider]  ?Glucagon (BAQSIMI ONE PACK) 3 MG/DOSE POWD Place 1 Dose into the nose as needed. ?Patient taking differently: Place 1 Dose into the nose as needed (low blood sugar). 10/07/19   Gretchen ShortBeasley, [name], NP  ?glucose blood test strip Use as instructed 05/13/21   Gretchen ShortBeasley, [name], NP  ?hydrOXYzine (ATARAX) 10 MG tablet TAKE 1 TABLET BY MOUTH EVERYDAY AT BEDTIME 09/28/21   Verneda SkillHacker, Caroline T, FNP  ?insulin aspart (NOVOLOG FLEXPEN) 100 UNIT/ML FlexPen INJECT UP  TO 50 UNITS SUBCUTANEOUSLY DAILY ?Patient taking differently: 0-50 Units 3 (three) times daily with meals. Per sliding scale 02/18/21   Gretchen Short, NP  ?insulin degludec (TRESIBA FLEXTOUCH) 100 UNIT/ML FlexTouch Pen Inject 30 Units into the skin at bedtime. Inject up to 50 units per day SubQ ?Patient taking differently: Inject 30 Units into the skin at bedtime. 07/22/21   Ghimire, Werner Lean, MD  ?Insulin Pen Needle (BD PEN NEEDLE NANO U/F) 32G X 4 MM MISC Use with insulin pens 6x daily 10/22/20   Gretchen Short, NP  ?norgestimate-ethinyl estradiol  (SPRINTEC 28) 0.25-35 MG-MCG tablet TAKE 1 TABLET DAILY. DISCARD PLACEBOS AND TAKE ACTIVE PILLS FOR CONTINUOUS CYCLING 08/31/21   [name], FNP  ?pantoprazole (PROTONIX) 40 MG tablet Take 1 tablet (40 mg total) by mouth daily. 08/11/21 08/11/22  Shon Hale, MD  ?Polysaccharide-Iron Complex 150 MG CAPS Take 1 capsule by mouth daily. 09/01/21   [name], FNP  ?prazosin (MINIPRESS) 2 MG capsule TAKE 1 CAPSULE BY MOUTH AT BEDTIME. 08/13/21   Georges Mouse, NP  ? ? ?Physical Exam: ?Vitals:  ? 11/22/21 1518 11/22/21 1745 11/22/21 1754  ?BP: (!) 134/98    ?Pulse: (!) 146 (!) 106   ?Resp: 18 19   ?Temp: (!) 97.5 ?F (36.4 ?C)    ?TempSrc: Oral    ?SpO2: 99% 100%   ?Weight:   59 kg  ? ? ?Constitutional: NAD, calm, comfortable ?Vitals:  ? 11/22/21 1518 11/22/21 1745 11/22/21 1754  ?BP: (!) 134/98    ?Pulse: (!) 146 (!) 106   ?Resp: 18 19   ?Temp: (!) 97.5 ?F (36.4 ?C)    ?TempSrc: Oral    ?SpO2: 99% 100%   ?Weight:   59 kg  ? ?Eyes: PERRL, lids and conjunctivae normal ?ENMT: Mucous membranes are dry. Posterior pharynx clear of any exudate or lesions.Normal dentition.  ?Neck: normal, supple, no masses, no thyromegaly ?Respiratory: clear to auscultation bilaterally, no wheezing, no crackles. Normal respiratory effort. No accessory muscle use.  ?Cardiovascular: Regular rate and rhythm, no murmurs / rubs / gallops. No extremity edema. 2+ pedal pulses. No carotid bruits.  ?Abdomen: no tenderness, no masses palpated. No hepatosplenomegaly. Bowel sounds positive.  ?Musculoskeletal: no clubbing / cyanosis. No joint deformity upper and lower extremities. Good ROM, no contractures. Normal muscle tone.  ?Skin: no rashes, lesions, ulcers. No induration ?Neurologic: CN 2-12 grossly intact. Sensation intact, DTR normal. Strength 5/5 in all 4.  ?Psychiatric: Normal judgment and insight. Alert and oriented x 3. Normal mood.  ? ? ?Labs on Admission: I have personally reviewed following labs and imaging  studies ? ?CBC: ?Recent Labs  ?Lab 11/22/21 ?1602 11/22/21 ?1610  ?WBC 8.8  --   ?NEUTROABS 7.2  --   ?HGB 18.0* 18.4*  ?HCT 53.6* 54.0*  ?MCV 90.7  --   ?PLT 408*  --   ? ?Basic Metabolic Panel: ?Recent Labs  ?Lab 11/22/21 ?1602 11/22/21 ?1610  ?NA 135 132*  ?K 5.2* 5.2*  ?CL 98  --   ?CO2 7*  --   ?GLUCOSE 587*  --   ?BUN 14  --   ?CREATININE 1.69*  --   ?CALCIUM 9.2  --   ? ?GFR: ?Estimated Creatinine Clearance: 41.8 mL/min (A) (by C-G formula based on SCr of 1.69 mg/dL (H)). ?Liver Function Tests: ?No results for input(s): AST, ALT, ALKPHOS, BILITOT, PROT, ALBUMIN in the last 168 hours. ?No results for input(s): LIPASE, AMYLASE in the last 168 hours. ?No results for input(s):  AMMONIA in the last 168 hours. ?Coagulation Profile: ?No results for input(s): INR, PROTIME in the last 168 hours. ?Cardiac Enzymes: ?No results for input(s): CKTOTAL, CKMB, CKMBINDEX, TROPONINI in the last 168 hours. ?BNP (last 3 results) ?No results for input(s): PROBNP in the last 8760 hours. ?HbA1C: ?No results for input(s): HGBA1C in the last 72 hours. ?CBG: ?Recent Labs  ?Lab 11/22/21 ?1522 11/22/21 ?1736  ?GLUCAP >600* 559*  ? ?Lipid Profile: ?No results for input(s): CHOL, HDL, LDLCALC, TRIG, CHOLHDL, LDLDIRECT in the last 72 hours. ?Thyroid Function Tests: ?No results for input(s): TSH, T4TOTAL, FREET4, T3FREE, THYROIDAB in the last 72 hours. ?Anemia Panel: ?No results for input(s): VITAMINB12, FOLATE, FERRITIN, TIBC, IRON, RETICCTPCT in the last 72 hours. ?Urine analysis: ?   ?Component Value Date/Time  ? COLORURINE STRAW (A) 11/22/2021 1509  ? APPEARANCEUR CLEAR 11/22/2021 1509  ? LABSPEC 1.025 11/22/2021 1509  ? PHURINE 5.0 11/22/2021 1509  ? GLUCOSEU >=500 (A) 11/22/2021 1509  ? HGBUR MODERATE (A) 11/22/2021 1509  ? BILIRUBINUR NEGATIVE 11/22/2021 1509  ? BILIRUBINUR neg 05/25/2021 0956  ? KETONESUR 80 (A) 11/22/2021 1509  ? PROTEINUR NEGATIVE 11/22/2021 1509  ? UROBILINOGEN negative (A) 05/25/2021 0956  ? UROBILINOGEN 0.2  08/16/2011 0330  ? NITRITE NEGATIVE 11/22/2021 1509  ? LEUKOCYTESUR NEGATIVE 11/22/2021 1509  ? ? ?Radiological Exams on Admission: ?No results found. ? ?EKG: Independently reviewed. Sinus tachy ? ?Assessment/Plan ?Principal Problem:

## 2021-11-22 NOTE — Progress Notes (Signed)

## 2021-11-22 NOTE — ED Provider Notes (Signed)
?[facility] EMERGENCY DEPARTMENT ?Provider Note ? ? ?CSN: [id number] ?Arrival date & time: 11/22/21  1509 ? ?  ? ?History ? ?Chief Complaint  ?Patient presents with  ? Hyperglycemia  ? ? ?Kelli Davis is a 21 y.o. female.  Patient presents with a chief complaint of hyperglycemia.  Capillary blood glucose in triage was over 600.  Patient states that she has had nausea, vomiting, and diarrhea for the past 2 to 3 days with what she considers to be food poisoning.  Yesterday she had a fight with her mother and left home without her insulin.  Her last dose of insulin was yesterday morning.  She typically takes insulin throughout the day with every meal.  The patient is a type I diabetic.  The patient was admitted for DKA December 2022.  The patient states that she is currently on her period.  PMH significant for type 1 diabetes, ADD, and eating disorder ? ?HPI ? ?  ? ?Home Medications ?Prior to Admission medications   ?Medication Sig Start Date End Date Taking? Authorizing Provider  ?ACCU-CHEK FASTCLIX LANCETS MISC Check sugar 10 x daily 10/21/15   Gretchen Short, NP  ?buPROPion (WELLBUTRIN XL) 300 MG 24 hr tablet TAKE 1 TABLET BY MOUTH EVERY DAY 05/26/21   [name], FNP  ?Continuous Blood Gluc Sensor (DEXCOM G6 SENSOR) MISC 1 APPLICATION BY DOES NOT APPLY ROUTE AS NEEDED. 01/05/21   Gretchen Short, NP  ?Continuous Blood Gluc Transmit (DEXCOM G6 TRANSMITTER) MISC 1 application by Does not apply route continuous as needed. 07/23/20   Gretchen Short, NP  ?escitalopram (LEXAPRO) 20 MG tablet TAKE 1 TABLET BY MOUTH EVERY DAY 08/11/21   Georges Mouse, NP  ?fluticasone (FLONASE) 50 MCG/ACT nasal spray 1 spray daily as needed for allergies. 02/06/19   [provider]  ?Glucagon (BAQSIMI ONE PACK) 3 MG/DOSE POWD Place 1 Dose into the nose as needed. ?Patient taking differently: Place 1 Dose into the nose as needed (low blood sugar). 10/07/19   Gretchen Short, NP  ?glucose blood test  strip Use as instructed 05/13/21   Gretchen Short, NP  ?hydrOXYzine (ATARAX) 10 MG tablet TAKE 1 TABLET BY MOUTH EVERYDAY AT BEDTIME 09/28/21   [name], FNP  ?insulin aspart (NOVOLOG FLEXPEN) 100 UNIT/ML FlexPen INJECT UP TO 50 UNITS SUBCUTANEOUSLY DAILY ?Patient taking differently: 0-50 Units 3 (three) times daily with meals. Per sliding scale 02/18/21   Gretchen Short, NP  ?insulin degludec (TRESIBA FLEXTOUCH) 100 UNIT/ML FlexTouch Pen Inject 30 Units into the skin at bedtime. Inject up to 50 units per day SubQ ?Patient taking differently: Inject 30 Units into the skin at bedtime. 07/22/21   Ghimire, Werner Lean, MD  ?Insulin Pen Needle (BD PEN NEEDLE NANO U/F) 32G X 4 MM MISC Use with insulin pens 6x daily 10/22/20   Gretchen Short, NP  ?norgestimate-ethinyl estradiol (SPRINTEC 28) 0.25-35 MG-MCG tablet TAKE 1 TABLET DAILY. DISCARD PLACEBOS AND TAKE ACTIVE PILLS FOR CONTINUOUS CYCLING 08/31/21   [name], FNP  ?pantoprazole (PROTONIX) 40 MG tablet Take 1 tablet (40 mg total) by mouth daily. 08/11/21 08/11/22  Shon Hale, MD  ?Polysaccharide-Iron Complex 150 MG CAPS Take 1 capsule by mouth daily. 09/01/21   [name], FNP  ?prazosin (MINIPRESS) 2 MG capsule TAKE 1 CAPSULE BY MOUTH AT BEDTIME. 08/13/21   Georges Mouse, NP  ?   ? ?Allergies    ?Penicillins   ? ?Review of Systems   ?Review of Systems  ?Constitutional:  Positive for diaphoresis. Negative for fever.  ?Respiratory:  Positive for shortness of breath.   ?Cardiovascular:  Negative for chest pain.  ?Gastrointestinal:  Positive for abdominal pain, diarrhea, nausea and vomiting.  ?Genitourinary:  Positive for vaginal bleeding (Patient is currently menstruating).  ? ?Physical Exam ?Updated Vital Signs ?BP (!) 134/98 (BP Location: Left Arm)   Pulse (!) 106   Temp (!) 97.5 ?F (36.4 ?C) (Oral)   Resp 19   Wt 59 kg   SpO2 100%   BMI 25.78 kg/m?  ?Physical Exam ?Vitals and nursing note reviewed.  ?Constitutional:   ?    Appearance: She is normal weight. She is ill-appearing.  ?HENT:  ?   Head: Normocephalic and atraumatic.  ?   Mouth/Throat:  ?   Mouth: Mucous membranes are moist.  ?Eyes:  ?   Conjunctiva/sclera: Conjunctivae normal.  ?   Pupils: Pupils are equal, round, and reactive to light.  ?Cardiovascular:  ?   Rate and Rhythm: Regular rhythm. Tachycardia present.  ?   Pulses: Normal pulses.  ?   Heart sounds: Normal heart sounds.  ?Pulmonary:  ?   Effort: Pulmonary effort is normal. No respiratory distress.  ?   Breath sounds: Normal breath sounds.  ?Abdominal:  ?   Palpations: Abdomen is soft.  ?   Tenderness: There is no abdominal tenderness.  ?Musculoskeletal:  ?   Cervical back: Normal range of motion.  ?Skin: ?   General: Skin is warm.  ?Neurological:  ?   Mental Status: She is alert and oriented to person, place, and time.  ? ? ?ED Results / Procedures / Treatments   ?Labs ?(all labs ordered are listed, but only abnormal results are displayed) ?Labs Reviewed  ?BASIC METABOLIC PANEL - Abnormal; Notable for the following components:  ?    Result Value  ? Potassium 5.2 (*)   ? CO2 7 (*)   ? Glucose, Bld 587 (*)   ? Creatinine, Ser 1.69 (*)   ? GFR, Estimated 44 (*)   ? Anion gap 30 (*)   ? All other components within normal limits  ?CBC WITH DIFFERENTIAL/PLATELET - Abnormal; Notable for the following components:  ? RBC 5.91 (*)   ? Hemoglobin 18.0 (*)   ? HCT 53.6 (*)   ? Platelets 408 (*)   ? All other components within normal limits  ?URINALYSIS, ROUTINE W REFLEX MICROSCOPIC - Abnormal; Notable for the following components:  ? Color, Urine STRAW (*)   ? Glucose, UA >=500 (*)   ? Hgb urine dipstick MODERATE (*)   ? Ketones, ur 80 (*)   ? All other components within normal limits  ?OSMOLALITY - Abnormal; Notable for the following components:  ? Osmolality 334 (*)   ? All other components within normal limits  ?CBG MONITORING, ED - Abnormal; Notable for the following components:  ? Glucose-Capillary >600 (*)   ? All other  components within normal limits  ?CBG MONITORING, ED - Abnormal; Notable for the following components:  ? Glucose-Capillary 559 (*)   ? All other components within normal limits  ?I-STAT VENOUS BLOOD GAS, ED - Abnormal; Notable for the following components:  ? pH, Ven 7.108 (*)   ? pCO2, Ven 23.5 (*)   ? pO2, Ven 60 (*)   ? Bicarbonate 7.4 (*)   ? TCO2 8 (*)   ? Acid-base deficit 20.0 (*)   ? Sodium 132 (*)   ? Potassium 5.2 (*)   ? Calcium, Ion 1.08 (*)   ?  HCT 54.0 (*)   ? Hemoglobin 18.4 (*)   ? All other components within normal limits  ?BASIC METABOLIC PANEL  ?BASIC METABOLIC PANEL  ?BASIC METABOLIC PANEL  ?BETA-HYDROXYBUTYRIC ACID  ?BETA-HYDROXYBUTYRIC ACID  ?I-STAT BETA HCG BLOOD, ED ([facility], [facility], [facility] ONLY)  ? ? ?EKG ?EKG Interpretation ? ?Date/Time:  Monday November 22 2021 15:34:43 EDT ?Ventricular Rate:  109 ?PR Interval:  134 ?QRS Duration: 86 ?QT Interval:  352 ?QTC Calculation: 474 ?R Axis:   86 ?Text Interpretation: Sinus tachycardia Right atrial enlargement peaked t waves compared with prior 12/22 Confirmed by Meridee Score [id number]) on 11/22/2021 3:37:47 PM ? ?Radiology ?No results found. ? ?Procedures ?Marland KitchenCritical Care ?Performed by: Darrick Grinder, PA-C ?Authorized by: Darrick Grinder, PA-C  ? ?Critical care provider statement:  ?  Critical care time (minutes):  30 ?  Critical care was necessary to treat or prevent imminent or life-threatening deterioration of the following conditions:  Endocrine crisis ?  Critical care was time spent personally by me on the following activities:  Development of treatment plan with patient or surrogate, discussions with consultants, evaluation of patient's response to treatment, examination of patient, ordering and review of laboratory studies, ordering and review of radiographic studies, ordering and performing treatments and interventions, pulse oximetry, re-evaluation of patient's condition and review of old charts ?  Care discussed with: admitting provider     ? ? ?Medications Ordered in ED ?Medications  ?ondansetron (ZOFRAN) injection 4 mg (4 mg Intravenous Not Given 11/22/21 1612)  ?insulin regular, human (MYXREDLIN) 100 units/ 100 mL infusion (5 Units/hr Intravenous New Bag/Gi

## 2021-11-22 NOTE — ED Notes (Signed)
Pt's Father: Dominic: [phone] ?

## 2021-11-22 NOTE — ED Triage Notes (Signed)
Pt arrived via POV for N/Vx2 days. Pt states she had a disagreement with her mother and left the house w/o her insulin yesterday. Pt is tachypneic and tachycardic. ?

## 2021-11-23 ENCOUNTER — Encounter (HOSPITAL_COMMUNITY): Payer: Self-pay | Admitting: Internal Medicine

## 2021-11-23 DIAGNOSIS — E1010 Type 1 diabetes mellitus with ketoacidosis without coma: Secondary | ICD-10-CM | POA: Diagnosis not present

## 2021-11-23 LAB — BASIC METABOLIC PANEL
Anion gap: 12 (ref 5–15)
Anion gap: 8 (ref 5–15)
BUN: 10 mg/dL (ref 6–20)
BUN: 5 mg/dL — ABNORMAL LOW (ref 6–20)
BUN: 7 mg/dL (ref 6–20)
CO2: 11 mmol/L — ABNORMAL LOW (ref 22–32)
CO2: 15 mmol/L — ABNORMAL LOW (ref 22–32)
CO2: <7 mmol/L — ABNORMAL LOW (ref 22–32)
Calcium: 7.2 mg/dL — ABNORMAL LOW (ref 8.9–10.3)
Calcium: 7.7 mg/dL — ABNORMAL LOW (ref 8.9–10.3)
Calcium: 8 mg/dL — ABNORMAL LOW (ref 8.9–10.3)
Chloride: 115 mmol/L — ABNORMAL HIGH (ref 98–111)
Chloride: 116 mmol/L — ABNORMAL HIGH (ref 98–111)
Chloride: 117 mmol/L — ABNORMAL HIGH (ref 98–111)
Creatinine, Ser: 0.91 mg/dL (ref 0.44–1.00)
Creatinine, Ser: 1.05 mg/dL — ABNORMAL HIGH (ref 0.44–1.00)
Creatinine, Ser: 1.28 mg/dL — ABNORMAL HIGH (ref 0.44–1.00)
GFR, Estimated: >60 mL/min (ref >60)
GFR, Estimated: >60 mL/min (ref >60)
GFR, Estimated: >60 mL/min (ref >60)
Glucose, Bld: 171 mg/dL — ABNORMAL HIGH (ref 70–99)
Glucose, Bld: 205 mg/dL — ABNORMAL HIGH (ref 70–99)
Glucose, Bld: 223 mg/dL — ABNORMAL HIGH (ref 70–99)
Potassium: 3.5 mmol/L (ref 3.5–5.1)
Potassium: 3.9 mmol/L (ref 3.5–5.1)
Potassium: 4.1 mmol/L (ref 3.5–5.1)
Sodium: 139 mmol/L (ref 135–145)
Sodium: 139 mmol/L (ref 135–145)
Sodium: 140 mmol/L (ref 135–145)

## 2021-11-23 LAB — CBG MONITORING, ED
Glucose-Capillary: 130 mg/dL — ABNORMAL HIGH (ref 70–99)
Glucose-Capillary: 153 mg/dL — ABNORMAL HIGH (ref 70–99)
Glucose-Capillary: 154 mg/dL — ABNORMAL HIGH (ref 70–99)
Glucose-Capillary: 156 mg/dL — ABNORMAL HIGH (ref 70–99)
Glucose-Capillary: 160 mg/dL — ABNORMAL HIGH (ref 70–99)
Glucose-Capillary: 163 mg/dL — ABNORMAL HIGH (ref 70–99)
Glucose-Capillary: 174 mg/dL — ABNORMAL HIGH (ref 70–99)
Glucose-Capillary: 180 mg/dL — ABNORMAL HIGH (ref 70–99)
Glucose-Capillary: 183 mg/dL — ABNORMAL HIGH (ref 70–99)
Glucose-Capillary: 207 mg/dL — ABNORMAL HIGH (ref 70–99)
Glucose-Capillary: 209 mg/dL — ABNORMAL HIGH (ref 70–99)
Glucose-Capillary: 212 mg/dL — ABNORMAL HIGH (ref 70–99)
Glucose-Capillary: 221 mg/dL — ABNORMAL HIGH (ref 70–99)

## 2021-11-23 LAB — BETA-HYDROXYBUTYRIC ACID
Beta-Hydroxybutyric Acid: 3.17 mmol/L — ABNORMAL HIGH (ref 0.05–0.27)
Beta-Hydroxybutyric Acid: 7.72 mmol/L — ABNORMAL HIGH (ref 0.05–0.27)

## 2021-11-23 LAB — MAGNESIUM: Magnesium: 1.4 mg/dL — ABNORMAL LOW (ref 1.7–2.4)

## 2021-11-23 LAB — CBC
HCT: 37.9 % (ref 36.0–46.0)
Hemoglobin: 13 g/dL (ref 12.0–15.0)
MCH: 30.7 pg (ref 26.0–34.0)
MCHC: 34.3 g/dL (ref 30.0–36.0)
MCV: 89.6 fL (ref 80.0–100.0)
Platelets: 255 10*3/uL (ref 150–400)
RBC: 4.23 MIL/uL (ref 3.87–5.11)
RDW: 12.1 % (ref 11.5–15.5)
WBC: 12 10*3/uL — ABNORMAL HIGH (ref 4.0–10.5)
nRBC: 0 % (ref 0.0–0.2)

## 2021-11-23 LAB — MRSA NEXT GEN BY PCR, NASAL: MRSA by PCR Next Gen: NOT DETECTED

## 2021-11-23 LAB — HEMOGLOBIN A1C
Hgb A1c MFr Bld: 14 % — ABNORMAL HIGH (ref 4.8–5.6)
Mean Plasma Glucose: 355.1 mg/dL

## 2021-11-23 LAB — GLUCOSE, CAPILLARY
Glucose-Capillary: 197 mg/dL — ABNORMAL HIGH (ref 70–99)
Glucose-Capillary: 288 mg/dL — ABNORMAL HIGH (ref 70–99)
Glucose-Capillary: 245 mg/dL — ABNORMAL HIGH (ref 70–99)

## 2021-11-23 LAB — BRAIN NATRIURETIC PEPTIDE: B Natriuretic Peptide: 472.6 pg/mL — ABNORMAL HIGH (ref 0.0–100.0)

## 2021-11-23 LAB — PHOSPHORUS: Phosphorus: 1.5 mg/dL — ABNORMAL LOW (ref 2.5–4.6)

## 2021-11-23 MED ORDER — INSULIN GLARGINE-YFGN 100 UNIT/ML ~~LOC~~ SOLN
30.0000 [IU] | Freq: Every day | SUBCUTANEOUS | Status: DC
  Administered 2021-11-23 – 2021-11-24 (×2): 30 [IU] via SUBCUTANEOUS
  Filled 2021-11-23 (×3): qty 0.3

## 2021-11-23 MED ORDER — POTASSIUM CHLORIDE 10 MEQ/100ML IV SOLN
10.0000 meq | INTRAVENOUS | Status: AC
  Administered 2021-11-23 (×2): 10 meq via INTRAVENOUS
  Filled 2021-11-23 (×2): qty 100

## 2021-11-23 MED ORDER — INSULIN ASPART 100 UNIT/ML IJ SOLN
0.0000 [IU] | INTRAMUSCULAR | Status: DC
  Administered 2021-11-23: 3 [IU] via SUBCUTANEOUS
  Administered 2021-11-23: 5 [IU] via SUBCUTANEOUS
  Administered 2021-11-24: 2 [IU] via SUBCUTANEOUS
  Administered 2021-11-24: 5 [IU] via SUBCUTANEOUS
  Administered 2021-11-24: 1 [IU] via SUBCUTANEOUS

## 2021-11-23 MED ORDER — SODIUM BICARBONATE 8.4 % IV SOLN
50.0000 meq | Freq: Once | INTRAVENOUS | Status: AC
  Administered 2021-11-23: 50 meq via INTRAVENOUS
  Filled 2021-11-23: qty 50

## 2021-11-23 NOTE — Progress Notes (Signed)
Took a CBG when patient arrived on the unit and it was 197, notified MD Woods of CBG and lack of insulin sliding scale orders. No new orders at this time, will continue to monitor.  ?

## 2021-11-23 NOTE — ED Notes (Signed)
Breakfast order placed ?

## 2021-11-23 NOTE — ED Notes (Signed)
Pt ambulated to bathroom 

## 2021-11-23 NOTE — Progress Notes (Addendum)
Inpatient Diabetes Program Recommendations ? ?AACE/ADA: New Consensus Statement on Inpatient Glycemic Control  ? ?Target Ranges:  Prepandial:   less than 140 mg/dL ?     Peak postprandial:   less than 180 mg/dL (1-2 hours) ?     Critically ill patients:  140 - 180 mg/dL  ? ? Latest Reference Range & Units 11/23/21 01:35 11/23/21 02:36 11/23/21 03:39 11/23/21 04:33 11/23/21 05:34 11/23/21 06:26 11/23/21 08:01 11/23/21 09:22  ?Glucose-Capillary 70 - 99 mg/dL 824 (H) 235 (H) 361 (H) 180 (H) 163 (H) 183 (H) 160 (H) 154 (H)  ? ? Latest Reference Range & Units 11/22/21 15:22 11/22/21 16:02  ?CO2 22 - 32 mmol/L <7 (L) 7 (L)  ?Glucose 70 - 99 mg/dL 443 (H) 154 (HH)  ?Anion gap 5 - 15  NOT CALCULATED 30 (H)  ? ?Review of Glycemic Control ? ?Diabetes history: DM1 (makes NO insulin; requires basal, correction, and carbohydrate coverage insulin) ?Outpatient Diabetes medications: Tresiba 30 units QHS, Novolog 0-15 units three to five times per day (1 unit for every 5 grams of carbs plus additional units for correction) ?Current orders for Inpatient glycemic control: IV insulin; Tresiba 30 units QHS, IV insulin ? ?Inpatient Diabetes Program Recommendations:   ? ?Insulin: Still acidotic on most current labs. Please discontinue Evaristo Bury order and continue IV insulin until acidosis has completely resolved. Once acidosis has resolved and provider is ready to transition from IV to SQ insulin, would recommend ordering Semglee 20 units Q24H, CBGs Q4H, Novolog 0-9 units Q4H, and Novolog 3 units TID with meals for meal coverage if patient eating at least 50% of meals. ? ?NOTE: Patient in ED at this time; admitted with DKA. Per H&P, "Patient reported that she had a quarry with family member and she left home two nights ago, but she forgot to take her insulin with her. Started from yesterday she developed epigastric cramping, frequent nauseous vomiting up stomach content, normal bowel nonbloody.  She has not been able to eat or drink since  yesterday because of GI symptoms." Patient has Type 1 DM and was last inpatient 08/09/21-08/11/21. Inpatient diabetes coordinator spoke with patient on 08/10/21 during that hospitalization. Patient sees Gretchen Short, NP (Pediatric Endocrinology) and was last seen 08/19/21. Per office note on 08/19/21 by Ovidio Kin, NP, "She has severely struggled with her health. 3 admission in one month for DKA due to noncompliance which is likely due to depression. Concerns about exacerbation of eating disorder per her most recent reports. She would greatly benefit from admission to inpatient psychiatric care and having the option for help with eating disorder would be very helpful." Per office note, patient is prescribed Tresiba 30 units daily, Novolog 120/30/5 (glucose target of 120 mg/dl, 1 unit drops glucose 30 mg/dl, and 1 unit covers 5 grams of carbs), Novolog At bedtime and 2am: 1 unit for every 50 points >150 and 1 unit for every 10 grams of carbs.  ?Will plan to speak with patient today. ? ?Addendum 11/23/21@13 :00-Spoke with patient at bedside regarding DM control. Patient confirms that she is prescribed Tresiba 30 units daily and Novolog 1 unit per 5 grams of carbs, plus Novolog correction (no able to verbalize how much 1 unit drops her glucose but pulled up correction scale she has a picture of on her phone that she uses for correction).  Patient confirms that she left home 2 days ago without her insulin. She states she has plenty of insulin at her mother's house. She states that her father is  going to take her by her mother's house when she is discharged and she is going to get all DM medications and supplies. She states she is going to stay with her father for now. Patient reports that she is good about taking the [place] 30 units every morning but she frequently forgets about taking a shot before she eats because "I am not sure how much I will be able to eat."  She also notes that she skips the Novolog meal coverage very  often.  Inquired about if she was eating consistently and she stated that she is trying to eat on a schedule which helps. Patient has breakfast and lunch tray in room which are both untouched. Asked patient if she could try to eat some of her lunch and she stated that she doesn't feel like she can eat right now. Explained that the carbohydrates would help her clear acidosis but she still does not want to eat or drink anything on her lunch tray. She asked for water which was given to her and she has at bedside. Patient reports that she uses a Dexcom CGM for glucose monitoring. She reports that she currently has a Dexcom on that she put on 2 days ago but it is not connecting so she is going to have to remove it and put on a new Dexcom sensor. She states she is out of Dexcom sensors but she has called [employer] about the issues with the current sensor not connecting and they are sending replacement sensor but it will take 3-5 business days. Offered to give patient a sample of FreeStyle Libre2 to use until Dexcom sensor comes in the mail but patient declined and stated that she will do finger sticks until her Dexcom sensor arrives. Patient reports that she has a follow up appointment with Ovidio Kin, NP at the end of this month. Patient reports that glucose has been doing okay over the past few weeks but does not that it has been challenging because of a traumatic event several months ago; she has been struggling with feelings of depression. Encouraged patient to reach out to her providers regarding her mental health and her endocrinology regarding DM control. Encouraged patient to try to at least take some insulin prior to meals (maybe cover at least 15 grams with insulin) and if she is able to eat well she could take additional insulin for other carbs she ate. Encouraged patient to be sure to get DM medications and supplies from her mother's house and to be sure to take Evaristo Bury consistently to keep herself out of DKA.   Encouraged her to try to start taking Novolog for carbs prior to meals or at least after she eats to get DM under better control.  Stressed importance of improving DM control to decrease risk of developing complications from uncontrolled DM. Patient states she has all needed DM medications and supplies and does not need any prescriptions for DM management at time of discharge. Patient verbalized understanding of information discussed and she states she has no questions at this time. ?  ?Thanks, ?Orlando Penner, RN, MSN, CDE ?Diabetes Coordinator ?Inpatient Diabetes Program ?[phone] (Team Pager from 8am to 5pm) ? ? ? ?

## 2021-11-23 NOTE — Progress Notes (Signed)
?PROGRESS NOTE ? ? ? ?[name]  WJX:[id number] DOB: 03/05/2001 DOA: 11/22/2021 ?PCP: Verneda SkillHacker, Caroline T, FNP  ? ?Brief Narrative:  ?Estock is a 21 y.o. WF PMHx Anxiety/Depression, ADD, eating disorder, physical growth delay, IDDM uncontrolled with hyperglycemia, chronic iron deficiency anemia secondary to menorrhagia, , came with DKA. ?  ?Patient reported that she had a quarry with family member and she left home two nights ago, but she forgot to take her insulin with her. Started from yesterday she developed epigastric cramping, frequent nauseous vomiting up stomach content, normal bowel nonbloody.  She has not been able to eat or drink since yesterday because of GI symptoms.  Denies any fever or chills.  Patient denies any suicidal ideation or plan at this point. ?  ?ED Course: Tachycardia, glucose> 600, VBG showed pH 7.1, bicarb 7. ?  ?Patient received 2 L IV fluid and insulin drip started in the ED. ?-1/5 hemoglobin A1c= 11.5 ? ?Subjective: ?A/O x4.  Patient states she normally takes her insulin however got into an argument with her mother and left the house and forgot to take her insulin along with her. ? ? ?Assessment & Plan: ? Covid vaccination; ? ?Principal Problem: ?  DKA (diabetic ketoacidosis) (HCC) ? ? ?DKA/DM type II uncontrolled with hyperglycemia ?-Secondary to noncoherent with insulin ?-4/11 hemoglobin A1c= 14 ?-Continue insulin drip with Endo tool protocol until DKA resolved ?-Patient reported that she has been on Tresiba 30 units daily + NovoLog sliding scale.  And she does follow her endocrinology every 6 months. ?-4/11 Tresiba 30 units, 2 hours after administration discontinue insulin drip** ?  ?AKI ?-Prerenal, likely secondary to severe dehydration with signs of hemoconcentration ?-Received a total of 2 L IV bolus in ED, will give another 2 L of IV bolus and continue current maintenance IV fluids. ?Lab Results  ?Component Value Date  ? CREATININE 0.63 11/24/2021  ? CREATININE 0.91  11/23/2021  ? CREATININE 1.05 (H) 11/23/2021  ? CREATININE 1.28 (H) 11/22/2021  ? CREATININE 1.69 (H) 11/22/2021  ? ?  ?Anxiety/depression ?-Denied any suicidal ideation, continue current regimen of SSRI. ?  ?Chronic iron deficiency anemia secondary to menorrhagia ?-Continue iron supplement. ? ?Hypokalemia ?- Potassium goal> 4 ?- 4/11 Potassium IV 20 mEq ? ?Hyponatremia ?- Resolved ?  ?Metabolic acidosis ?- Secondary to DKA. ?- 4/11 1 amp sodium bicarb ? ? ? ?Mobility Assessment (last 72 hours)   ? ? Mobility Assessment   ?No documentation. ? ?  ?  ? ?  ? ? ?DVT prophylaxis: Lovenox ?Code Status: Full ?Family Communication: 4/11 father at bedside for discussion of plan of care all questions answered ?Status is: Inpatient ? ? ? ?Dispo: The patient is from: Home ?             Anticipated d/c is to: Home ?             Anticipated d/c date is: 2 days ?             Patient currently is not medically stable to d/c. ? ? ? ? ? ?Consultants:  ? ? ?Procedures/Significant Events:  ? ? ? ?I have personally reviewed and interpreted all radiology studies and my findings are as above. ? ?VENTILATOR SETTINGS: ? ? ? ?Cultures ? ? ?Antimicrobials: ? ? ? ?Devices ?  ? ?LINES / TUBES:  ? ? ? ? ?Continuous Infusions: ? dextrose 5% lactated ringers 125 mL/hr at 11/23/21 0539  ? insulin 2 Units/hr (11/23/21 0932)  ? lactated ringers    ? ? ? ?  Objective: ?Vitals:  ? 11/23/21 0730 11/23/21 0800 11/23/21 0830 11/23/21 0900  ?BP: 110/75 112/71 114/70 112/68  ?Pulse: 96 91 96 97  ?Resp: 15 13 (!) 22 (!) 22  ?Temp:      ?TempSrc:      ?SpO2: 100% 100% 99% 98%  ?Weight:      ? ? ?Intake/Output Summary (Last 24 hours) at 11/23/2021 0957 ?Last data filed at 11/23/2021 0802 ?Gross per 24 hour  ?Intake 2100 ml  ?Output --  ?Net 2100 ml  ? ?Filed Weights  ? 11/22/21 1754  ?Weight: 59 kg  ? ? ?Examination: ? ?General: A/O x4, No acute respiratory distress ?Eyes: negative scleral hemorrhage, negative anisocoria, negative icterus ?ENT: Negative Runny  nose, negative gingival bleeding, ?Neck:  Negative scars, masses, torticollis, lymphadenopathy, JVD ?Lungs: Clear to auscultation bilaterally without wheezes or crackles ?Cardiovascular: Regular rate and rhythm without murmur gallop or rub normal S1 and S2 ?Abdomen: negative abdominal pain, nondistended, positive soft, bowel sounds, no rebound, no ascites, no appreciable mass ?Extremities: No significant cyanosis, clubbing, or edema bilateral lower extremities ?Skin: Negative rashes, lesions, ulcers ?Psychiatric:  Negative depression, negative anxiety, negative fatigue, negative mania  ?Central nervous system:  Cranial nerves II through XII intact, tongue/uvula midline, all extremities muscle strength 5/5, sensation intact throughout, negative dysarthria, negative expressive aphasia, negative receptive aphasia. ? ?.  ? ? ? ?Data Reviewed: Care during the described time interval was provided by me .  I have reviewed this patient's available data, including medical history, events of note, physical examination, and all test results as part of my evaluation.  ? ?CBC: ?Recent Labs  ?Lab 11/22/21 ?1602 11/22/21 ?1610 11/23/21 ?9622  ?WBC 8.8  --  12.0*  ?NEUTROABS 7.2  --   --   ?HGB 18.0* 18.4* 13.0  ?HCT 53.6* 54.0* 37.9  ?MCV 90.7  --  89.6  ?PLT 408*  --  255  ? ?Basic Metabolic Panel: ?Recent Labs  ?Lab 11/22/21 ?1522 11/22/21 ?1602 11/22/21 ?1610 11/22/21 ?2341 11/23/21 ?2979 11/23/21 ?8921  ?NA 139 135 132* 139 140 139  ?K 4.1 5.2* 5.2* 4.1 3.9 3.5  ?CL 109 98  --  115* 117* 116*  ?CO2 <7* 7*  --  <7* 11* 15*  ?GLUCOSE 354* 587*  --  223* 205* 171*  ?BUN 14 14  --  10 7 5*  ?CREATININE 1.51* 1.69*  --  1.28* 1.05* 0.91  ?CALCIUM 7.6* 9.2  --  7.2* 7.7* 8.0*  ? ?GFR: ?Estimated Creatinine Clearance: 77.7 mL/min (by C-G formula based on SCr of 0.91 mg/dL). ?Liver Function Tests: ?No results for input(s): AST, ALT, ALKPHOS, BILITOT, PROT, ALBUMIN in the last 168 hours. ?No results for input(s): LIPASE, AMYLASE in the  last 168 hours. ?No results for input(s): AMMONIA in the last 168 hours. ?Coagulation Profile: ?No results for input(s): INR, PROTIME in the last 168 hours. ?Cardiac Enzymes: ?No results for input(s): CKTOTAL, CKMB, CKMBINDEX, TROPONINI in the last 168 hours. ?BNP (last 3 results) ?No results for input(s): PROBNP in the last 8760 hours. ?HbA1C: ?No results for input(s): HGBA1C in the last 72 hours. ?CBG: ?Recent Labs  ?Lab 11/23/21 ?0433 11/23/21 ?1941 11/23/21 ?7408 11/23/21 ?0801 11/23/21 ?1448  ?GLUCAP 180* 163* 183* 160* 154*  ? ?Lipid Profile: ?No results for input(s): CHOL, HDL, LDLCALC, TRIG, CHOLHDL, LDLDIRECT in the last 72 hours. ?Thyroid Function Tests: ?No results for input(s): TSH, T4TOTAL, FREET4, T3FREE, THYROIDAB in the last 72 hours. ?Anemia Panel: ?No results for input(s): VITAMINB12, FOLATE, FERRITIN,  TIBC, IRON, RETICCTPCT in the last 72 hours. ?Urine analysis: ?   ?Component Value Date/Time  ? COLORURINE STRAW (A) 11/22/2021 1509  ? APPEARANCEUR CLEAR 11/22/2021 1509  ? LABSPEC 1.025 11/22/2021 1509  ? PHURINE 5.0 11/22/2021 1509  ? GLUCOSEU >=500 (A) 11/22/2021 1509  ? HGBUR MODERATE (A) 11/22/2021 1509  ? BILIRUBINUR NEGATIVE 11/22/2021 1509  ? BILIRUBINUR neg 05/25/2021 0956  ? KETONESUR 80 (A) 11/22/2021 1509  ? PROTEINUR NEGATIVE 11/22/2021 1509  ? UROBILINOGEN negative (A) 05/25/2021 0956  ? UROBILINOGEN 0.2 08/16/2011 0330  ? NITRITE NEGATIVE 11/22/2021 1509  ? LEUKOCYTESUR NEGATIVE 11/22/2021 1509  ? ?Sepsis Labs: ?@[email or web address] (procalcitonin:4,lacticidven:4) ? ?)No results found for this or any previous visit (from the past 240 hour(s)).  ? ? ? ? ? ?Radiology Studies: ?No results found. ? ? ? ? ? ?Scheduled Meds: ? buPROPion  300 mg Oral Daily  ? escitalopram  20 mg Oral Daily  ? hydrOXYzine  10 mg Oral QHS  ? iron polysaccharides  150 mg Oral Daily  ? ondansetron (ZOFRAN) IV  4 mg Intravenous Once  ? pantoprazole  40 mg Oral Daily  ? prazosin  2 mg Oral QHS  ? ?Continuous Infusions: ?  dextrose 5% lactated ringers 125 mL/hr at 11/23/21 0539  ? insulin 2 Units/hr (11/23/21 0932)  ? lactated ringers    ? ? ? LOS: 1 day  ? ?The patient is critically ill with multiple organ systems failure and require

## 2021-11-24 DIAGNOSIS — F320 Major depressive disorder, single episode, mild: Secondary | ICD-10-CM | POA: Diagnosis not present

## 2021-11-24 DIAGNOSIS — N179 Acute kidney failure, unspecified: Secondary | ICD-10-CM | POA: Diagnosis not present

## 2021-11-24 DIAGNOSIS — F419 Anxiety disorder, unspecified: Secondary | ICD-10-CM | POA: Diagnosis not present

## 2021-11-24 DIAGNOSIS — F32A Depression, unspecified: Secondary | ICD-10-CM | POA: Diagnosis present

## 2021-11-24 DIAGNOSIS — D500 Iron deficiency anemia secondary to blood loss (chronic): Secondary | ICD-10-CM | POA: Diagnosis present

## 2021-11-24 DIAGNOSIS — E1165 Type 2 diabetes mellitus with hyperglycemia: Secondary | ICD-10-CM | POA: Diagnosis present

## 2021-11-24 DIAGNOSIS — E1010 Type 1 diabetes mellitus with ketoacidosis without coma: Secondary | ICD-10-CM | POA: Diagnosis not present

## 2021-11-24 DIAGNOSIS — N920 Excessive and frequent menstruation with regular cycle: Secondary | ICD-10-CM

## 2021-11-24 LAB — HEMOGLOBIN A1C
Hgb A1c MFr Bld: 14.6 % — ABNORMAL HIGH (ref 4.8–5.6)
Mean Plasma Glucose: 372 mg/dL

## 2021-11-24 LAB — CBC WITH DIFFERENTIAL/PLATELET
Abs Immature Granulocytes: 0.01 10*3/uL (ref 0.00–0.07)
Basophils Absolute: 0 10*3/uL (ref 0.0–0.1)
Basophils Relative: 0 %
Eosinophils Absolute: 0.1 10*3/uL (ref 0.0–0.5)
Eosinophils Relative: 1 %
HCT: 32.5 % — ABNORMAL LOW (ref 36.0–46.0)
Hemoglobin: 12 g/dL (ref 12.0–15.0)
Immature Granulocytes: 0 %
Lymphocytes Relative: 49 %
Lymphs Abs: 2.3 10*3/uL (ref 0.7–4.0)
MCH: 31.3 pg (ref 26.0–34.0)
MCHC: 36.9 g/dL — ABNORMAL HIGH (ref 30.0–36.0)
MCV: 84.6 fL (ref 80.0–100.0)
Monocytes Absolute: 0.3 10*3/uL (ref 0.1–1.0)
Monocytes Relative: 5 %
Neutro Abs: 2.3 10*3/uL (ref 1.7–7.7)
Neutrophils Relative %: 45 %
Platelets: 206 10*3/uL (ref 150–400)
RBC: 3.84 MIL/uL — ABNORMAL LOW (ref 3.87–5.11)
RDW: 12.1 % (ref 11.5–15.5)
WBC: 5 10*3/uL (ref 4.0–10.5)
nRBC: 0 % (ref 0.0–0.2)

## 2021-11-24 LAB — MAGNESIUM
Magnesium: 1.2 mg/dL — ABNORMAL LOW (ref 1.7–2.4)
Magnesium: 1.9 mg/dL (ref 1.7–2.4)

## 2021-11-24 LAB — BASIC METABOLIC PANEL
Anion gap: 6 (ref 5–15)
BUN: <5 mg/dL — ABNORMAL LOW (ref 6–20)
CO2: 23 mmol/L (ref 22–32)
Calcium: 7.6 mg/dL — ABNORMAL LOW (ref 8.9–10.3)
Chloride: 108 mmol/L (ref 98–111)
Creatinine, Ser: 0.76 mg/dL (ref 0.44–1.00)
GFR, Estimated: >60 mL/min (ref >60)
Glucose, Bld: 311 mg/dL — ABNORMAL HIGH (ref 70–99)
Potassium: 3.5 mmol/L (ref 3.5–5.1)
Sodium: 137 mmol/L (ref 135–145)

## 2021-11-24 LAB — COMPREHENSIVE METABOLIC PANEL
ALT: 15 U/L (ref 0–44)
AST: 16 U/L (ref 15–41)
Albumin: 2.8 g/dL — ABNORMAL LOW (ref 3.5–5.0)
Alkaline Phosphatase: 51 U/L (ref 38–126)
Anion gap: 7 (ref 5–15)
BUN: <5 mg/dL — ABNORMAL LOW (ref 6–20)
CO2: 22 mmol/L (ref 22–32)
Calcium: 7.6 mg/dL — ABNORMAL LOW (ref 8.9–10.3)
Chloride: 110 mmol/L (ref 98–111)
Creatinine, Ser: 0.63 mg/dL (ref 0.44–1.00)
GFR, Estimated: >60 mL/min (ref >60)
Glucose, Bld: 174 mg/dL — ABNORMAL HIGH (ref 70–99)
Potassium: 2.5 mmol/L — CL (ref 3.5–5.1)
Sodium: 139 mmol/L (ref 135–145)
Total Bilirubin: 0.5 mg/dL (ref 0.3–1.2)
Total Protein: 5.2 g/dL — ABNORMAL LOW (ref 6.5–8.1)

## 2021-11-24 LAB — GLUCOSE, CAPILLARY
Glucose-Capillary: 168 mg/dL — ABNORMAL HIGH (ref 70–99)
Glucose-Capillary: 147 mg/dL — ABNORMAL HIGH (ref 70–99)
Glucose-Capillary: 259 mg/dL — ABNORMAL HIGH (ref 70–99)
Glucose-Capillary: 261 mg/dL — ABNORMAL HIGH (ref 70–99)
Glucose-Capillary: 193 mg/dL — ABNORMAL HIGH (ref 70–99)
Glucose-Capillary: 125 mg/dL — ABNORMAL HIGH (ref 70–99)

## 2021-11-24 LAB — PHOSPHORUS
Phosphorus: 1.7 mg/dL — ABNORMAL LOW (ref 2.5–4.6)
Phosphorus: 2.9 mg/dL (ref 2.5–4.6)

## 2021-11-24 MED ORDER — POTASSIUM PHOSPHATES 15 MMOLE/5ML IV SOLN
30.0000 mmol | Freq: Once | INTRAVENOUS | Status: AC
  Administered 2021-11-24: 30 mmol via INTRAVENOUS
  Filled 2021-11-24: qty 10

## 2021-11-24 MED ORDER — POTASSIUM CHLORIDE 10 MEQ/100ML IV SOLN
INTRAVENOUS | Status: AC
  Administered 2021-11-24: 10 meq
  Filled 2021-11-24: qty 200

## 2021-11-24 MED ORDER — ENOXAPARIN SODIUM 40 MG/0.4ML IJ SOSY
40.0000 mg | PREFILLED_SYRINGE | Freq: Every day | INTRAMUSCULAR | Status: DC
  Administered 2021-11-24 – 2021-11-25 (×2): 40 mg via SUBCUTANEOUS
  Filled 2021-11-24 (×2): qty 0.4

## 2021-11-24 MED ORDER — MAGNESIUM SULFATE 4 GM/100ML IV SOLN
4.0000 g | Freq: Once | INTRAVENOUS | Status: AC
  Administered 2021-11-24: 4 g via INTRAVENOUS
  Filled 2021-11-24: qty 100

## 2021-11-24 MED ORDER — SODIUM CHLORIDE 0.9 % IV SOLN: INTRAVENOUS | Status: DC | PRN

## 2021-11-24 MED ORDER — INSULIN ASPART 100 UNIT/ML IJ SOLN: 0.0000 [IU] | INTRAMUSCULAR | Status: DC

## 2021-11-24 MED ORDER — POTASSIUM CHLORIDE CRYS ER 20 MEQ PO TBCR
40.0000 meq | EXTENDED_RELEASE_TABLET | Freq: Once | ORAL | Status: AC
  Administered 2021-11-24: 40 meq via ORAL
  Filled 2021-11-24: qty 2

## 2021-11-24 MED ORDER — INSULIN GLARGINE-YFGN 100 UNIT/ML ~~LOC~~ SOLN
4.0000 [IU] | Freq: Once | SUBCUTANEOUS | Status: AC
  Administered 2021-11-24: 4 [IU] via SUBCUTANEOUS
  Filled 2021-11-24: qty 0.04

## 2021-11-24 MED ORDER — INSULIN ASPART 100 UNIT/ML IJ SOLN
0.0000 [IU] | INTRAMUSCULAR | Status: DC
  Administered 2021-11-24: 3 [IU] via SUBCUTANEOUS
  Administered 2021-11-24: 8 [IU] via SUBCUTANEOUS
  Administered 2021-11-24 – 2021-11-25 (×2): 2 [IU] via SUBCUTANEOUS

## 2021-11-24 MED ORDER — POTASSIUM CHLORIDE 10 MEQ/100ML IV SOLN
10.0000 meq | INTRAVENOUS | Status: AC
  Administered 2021-11-24 (×5): 10 meq via INTRAVENOUS
  Filled 2021-11-24 (×4): qty 100

## 2021-11-24 NOTE — Progress Notes (Addendum)
?PROGRESS NOTE ? ? ? ?Kelli Davis  SWF:[id number] DOB: 08-Aug-2001 DOA: 11/22/2021 ?PCP: [name], FNP  ? ?Brief Narrative:  ?Spindel is a 21 y.o. WF PMHx Anxiety/Depression, ADD, eating disorder, physical growth delay, IDDM uncontrolled with hyperglycemia, chronic iron deficiency anemia secondary to menorrhagia, , came with DKA. ?  ?Patient reported that she had a quarry with family member and she left home two nights ago, but she forgot to take her insulin with her. Started from yesterday she developed epigastric cramping, frequent nauseous vomiting up stomach content, normal bowel nonbloody.  She has not been able to eat or drink since yesterday because of GI symptoms.  Denies any fever or chills.  Patient denies any suicidal ideation or plan at this point. ?  ?ED Course: Tachycardia, glucose> 600, VBG showed pH 7.1, bicarb 7. ?  ?Patient received 2 L IV fluid and insulin drip started in the ED. ?-1/5 hemoglobin A1c= 11.5 ? ?Subjective: ?4/12 afebrile overnight A/O x4, patient requests to take a shower. ? ? ?Assessment & Plan: ? Covid vaccination; vaccinated ? ?Principal Problem: ?  DKA (diabetic ketoacidosis) (HCC) ?Active Problems: ?  Uncontrolled type 2 diabetes mellitus with hyperglycemia (HCC) ?  AKI (acute kidney injury) (HCC) ?  Anxiety ?  Depression ?  Iron deficiency anemia due to chronic blood loss ?  Menorrhagia ? ? ?DKA/DM type II uncontrolled with hyperglycemia ?-Secondary to noncoherent with insulin ?-4/11 hemoglobin A1c= 14 ?-Continue insulin drip with Endo tool protocol until DKA resolved ?-Patient reported that she has been on Tresiba 30 units daily + NovoLog sliding scale.  And she does follow her endocrinology every 6 months. ?-4/11 Semglee 30 units, 2 hours after administration discontinue insulin drip ?- 4/12 increase Semglee 34 units daily ?-4/12 start moderate SSI (home regimen) ?  ?AKI ?-Prerenal, likely secondary to severe dehydration with signs of hemoconcentration ?-Received a  total of 2 L IV bolus in ED, will give another 2 L of IV bolus and continue current maintenance IV fluids. ?Lab Results  ?Component Value Date  ? CREATININE 0.63 11/24/2021  ? CREATININE 0.91 11/23/2021  ? CREATININE 1.05 (H) 11/23/2021  ? CREATININE 1.28 (H) 11/22/2021  ? CREATININE 1.69 (H) 11/22/2021  ? ?  ?Anxiety/depression ?-Denied any suicidal ideation, continue current regimen of SSRI. ?  ?Chronic iron deficiency anemia secondary to menorrhagia ?-Continue iron supplement. ? ?Hypokalemia ?- Potassium goal> 4 ?-4/12 K-Dur 40 mEq + K-Phos 30 mmol ?-4/12 recheck BMP, Mg, PO4@1200  ? ?Hypomagnesmia ?-Magnesium goal> 2 ?- 4/12 magnesium IV 1 g ? ?Hypophosphatemia ?-Phosphorus goal> 2.5 ?- See hypokalemia ? ?Metabolic acidosis ?- Secondary to DKA. ?- 4/11 1 amp sodium bicarb ? ? ? ?Mobility Assessment (last 72 hours)   ? ? Mobility Assessment   ? ? Row Name 11/24/21 0743 11/23/21 1750  ?  ?  ?  ? Does patient have an order for bedrest or is patient medically unstable No - Continue assessment No - Continue assessment     ? What is the highest level of mobility based on the progressive mobility assessment? Level 6 (Walks independently in room and hall) - Balance while walking in room without assist - Complete Level 6 (Walks independently in room and hall) - Balance while walking in room without assist - Complete     ? ?  ?  ? ?  ? ? ?DVT prophylaxis: Lovenox ?Code Status: Full ?Family Communication: 4/12 father on phone for discussion of plan of care all questions answered ?Status is: Inpatient ? ? ? ?  Dispo: The patient is from: Home ?             Anticipated d/c is to: Home ?             Anticipated d/c date is: 2 days ?             Patient currently is not medically stable to d/c. ? ? ? ? ? ?Consultants:  ? ? ?Procedures/Significant Events:  ? ? ? ?I have personally reviewed and interpreted all radiology studies and my findings are as above. ? ?VENTILATOR  SETTINGS: ? ? ? ?Cultures ? ? ?Antimicrobials: ? ? ? ?Devices ?  ? ?LINES / TUBES:  ? ? ? ? ?Continuous Infusions: ? sodium chloride 10 mL/hr at 11/24/21 1046  ? dextrose 5% lactated ringers 125 mL/hr at 11/23/21 2253  ? lactated ringers    ? potassium chloride 10 mEq (11/24/21 1004)  ? potassium PHOSPHATE IVPB (in mmol) 30 mmol (11/24/21 0834)  ? ? ? ?Objective: ?Vitals:  ? 11/23/21 2300 11/24/21 0300 11/24/21 0800 11/24/21 1108  ?BP: 109/62 (!) 103/57 115/74 (!) 143/88  ?Pulse: 92 78 66 74  ?Resp: 16 15 19 18   ?Temp: 98.3 ?F (36.8 ?C) 98.2 ?F (36.8 ?C) 98.2 ?F (36.8 ?C) 97.8 ?F (36.6 ?C)  ?TempSrc: Oral Oral Oral Oral  ?SpO2: 97% 95% 96% 98%  ?Weight:      ?Height:      ? ? ?Intake/Output Summary (Last 24 hours) at 11/24/2021 1208 ?Last data filed at 11/24/2021 0300 ?Gross per 24 hour  ?Intake 2913.25 ml  ?Output 0 ml  ?Net 2913.25 ml  ? ?Filed Weights  ? 11/22/21 1754 11/23/21 1220 11/23/21 1746  ?Weight: 59 kg 59 kg 56.1 kg  ? ? ?Examination: ? ?General: A/O x4, No acute respiratory distress ?Eyes: negative scleral hemorrhage, negative anisocoria, negative icterus ?ENT: Negative Runny nose, negative gingival bleeding, ?Neck:  Negative scars, masses, torticollis, lymphadenopathy, JVD ?Lungs: Clear to auscultation bilaterally without wheezes or crackles ?Cardiovascular: Regular rate and rhythm without murmur gallop or rub normal S1 and S2 ?Abdomen: negative abdominal pain, nondistended, positive soft, bowel sounds, no rebound, no ascites, no appreciable mass ?Extremities: No significant cyanosis, clubbing, or edema bilateral lower extremities ?Skin: Negative rashes, lesions, ulcers ?Psychiatric:  Negative depression, negative anxiety, negative fatigue, negative mania  ?Central nervous system:  Cranial nerves II through XII intact, tongue/uvula midline, all extremities muscle strength 5/5, sensation intact throughout, negative dysarthria, negative expressive aphasia, negative receptive aphasia. ? ?.  ? ? ? ?Data  Reviewed: Care during the described time interval was provided by me .  I have reviewed this patient's available data, including medical history, events of note, physical examination, and all test results as part of my evaluation.  ? ?CBC: ?Recent Labs  ?Lab 11/22/21 ?1602 11/22/21 ?1610 11/23/21 ?01/23/22 11/24/21 ?0415  ?WBC 8.8  --  12.0* 5.0  ?NEUTROABS 7.2  --   --  2.3  ?HGB 18.0* 18.4* 13.0 12.0  ?HCT 53.6* 54.0* 37.9 32.5*  ?MCV 90.7  --  89.6 84.6  ?PLT 408*  --  255 206  ? ?Basic Metabolic Panel: ?Recent Labs  ?Lab 11/22/21 ?1602 11/22/21 ?1610 11/22/21 ?2341 11/23/21 ?01/23/22 11/23/21 ?0806 11/23/21 ?1851 11/24/21 ?0415  ?NA 135 132* 139 140 139  --  139  ?K 5.2* 5.2* 4.1 3.9 3.5  --  2.5*  ?CL 98  --  115* 117* 116*  --  110  ?CO2 7*  --  <7* 11* 15*  --  22  ?GLUCOSE 587*  --  223* 205* 171*  --  174*  ?BUN 14  --  10 7 5*  --  <5*  ?CREATININE 1.69*  --  1.28* 1.05* 0.91  --  0.63  ?CALCIUM 9.2  --  7.2* 7.7* 8.0*  --  7.6*  ?MG  --   --   --   --   --  1.4* 1.2*  ?PHOS  --   --   --   --   --  1.5* 1.7*  ? ?GFR: ?Estimated Creatinine Clearance: 85 mL/min (by C-G formula based on SCr of 0.63 mg/dL). ?Liver Function Tests: ?Recent Labs  ?Lab 11/24/21 ?0415  ?AST 16  ?ALT 15  ?ALKPHOS 51  ?BILITOT 0.5  ?PROT 5.2*  ?ALBUMIN 2.8*  ? ?No results for input(s): LIPASE, AMYLASE in the last 168 hours. ?No results for input(s): AMMONIA in the last 168 hours. ?Coagulation Profile: ?No results for input(s): INR, PROTIME in the last 168 hours. ?Cardiac Enzymes: ?No results for input(s): CKTOTAL, CKMB, CKMBINDEX, TROPONINI in the last 168 hours. ?BNP (last 3 results) ?No results for input(s): PROBNP in the last 8760 hours. ?HbA1C: ?Recent Labs  ?  11/23/21 ?2025  ?HGBA1C 14.0*  ? ?CBG: ?Recent Labs  ?Lab 11/23/21 ?2016 11/23/21 ?2327 11/24/21 ?0313 11/24/21 ?[id number] 11/24/21 ?1108  ?GLUCAP 288* 245* 168* 147* 259*  ? ?Lipid Profile: ?No results for input(s): CHOL, HDL, LDLCALC, TRIG, CHOLHDL, LDLDIRECT in the last 72  hours. ?Thyroid Function Tests: ?No results for input(s): TSH, T4TOTAL, FREET4, T3FREE, THYROIDAB in the last 72 hours. ?Anemia Panel: ?No results for input(s): VITAMINB12, FOLATE, FERRITIN, TIBC, IRON, RETICCTPCT in the last 72 hours. ?Urine analysis: ?   ?Component Valu

## 2021-11-24 NOTE — Progress Notes (Signed)
Date and time results received: 11/24/21 0515 ?(use smartphrase ".now" to insert current time) ? ?Test: Potassium ?Critical Value: 2.5 ? ?Name of Provider Notified: John Giovanni  ? ?Orders Received? Awaiting Or Actions Taken?:  ?

## 2021-11-24 NOTE — Plan of Care (Signed)
?  Problem: Education: ?Goal: Knowledge of General Education information will improve ?Description: Including pain rating scale, medication(s)/side effects and non-pharmacologic comfort measures ?Outcome: Progressing ?  ?Problem: Health Behavior/Discharge Planning: ?Goal: Ability to manage health-related needs will improve ?Outcome: Progressing ?  ?Problem: Clinical Measurements: ?Goal: Will remain free from infection ?Outcome: Progressing ?Goal: Respiratory complications will improve ?Outcome: Progressing ?Goal: Cardiovascular complication will be avoided ?Outcome: Progressing ?  ?Problem: Activity: ?Goal: Risk for activity intolerance will decrease ?Outcome: Progressing ?  ?Problem: Nutrition: ?Goal: Adequate nutrition will be maintained ?Outcome: Progressing ?  ?Problem: Coping: ?Goal: Level of anxiety will decrease ?Outcome: Progressing ?  ?Problem: Pain Managment: ?Goal: General experience of comfort will improve ?Outcome: Progressing ?  ?Problem: Safety: ?Goal: Ability to remain free from injury will improve ?Outcome: Progressing ?  ?Problem: Skin Integrity: ?Goal: Risk for impaired skin integrity will decrease ?Outcome: Progressing ?  ?

## 2021-11-24 NOTE — Progress Notes (Signed)
?  Transition of Care (TOC) Screening Note ? ? ?Patient Details  ?Name: Kelli Davis ?Date of Birth: March 29, 2001 ? ? ?Transition of Care (TOC) CM/SW Contact:    ?[place], LCSW ?Phone Number: ?11/24/2021, 11:57 AM ? ? ? ?Transition of Care Department [facility]) has reviewed patient and no TOC needs have been identified at this time. We will continue to monitor patient advancement through interdisciplinary progression rounds. If new patient transition needs arise, please place a TOC consult. ?  ?

## 2021-11-24 NOTE — Progress Notes (Signed)
ANTICOAGULATION CONSULT NOTE ? ?Pharmacy Consult for Enoxaparin ?Indication: VTE prophylaxis ? ?Allergies  ?Allergen Reactions  ? Penicillins Hives  ?  Did it involve swelling of the face/tongue/throat, SOB, or low BP? Yes ?Did it involve sudden or severe rash/hives, skin peeling, or any reaction on the inside of your mouth or nose? Yes ?Did you need to seek medical attention at a hospital or doctor's office? Yes ?When did it last happen?    today   ?If all above answers are "NO", may proceed with cephalosporin use. ? ?  ? ? ?Patient Measurements: ?Height: 4\' 11"  (149.9 cm) ?Weight: 56.1 kg (123 lb 10.9 oz) ?IBW/kg (Calculated) : 43.2 ? ?Vital Signs: ?Temp: 98.2 ?F (36.8 ?C) (04/12 0800) ?Temp Source: Oral (04/12 0800) ?BP: 115/74 (04/12 0800) ?Pulse Rate: 66 (04/12 0800) ? ?Labs: ?Recent Labs  ?  11/22/21 ?1602 11/22/21 ?1610 11/22/21 ?2341 11/23/21 ?01/23/22 11/23/21 ?01/23/22 11/24/21 ?0415  ?HGB 18.0* 18.4*  --  13.0  --  12.0  ?HCT 53.6* 54.0*  --  37.9  --  32.5*  ?PLT 408*  --   --  255  --  206  ?CREATININE 1.69*  --    < > 1.05* 0.91 0.63  ? < > = values in this interval not displayed.  ? ? ?Estimated Creatinine Clearance: 85 mL/min (by C-G formula based on SCr of 0.63 mg/dL). ? ? ?Assessment: ?21 y.o. female with medical history significant for T1DM who presented with DKA. Pharmacy consulted for enoxaparin dosing for VTE prophylaxis. CBC stable, no issues with bleeding reported. ? ?Goal of Therapy:  ?Monitor platelets by anticoagulation protocol: Yes ?  ?Plan:  ?Enoxaparin 40mg  SQ daily ?Continue to monitor H&H and platelets ? ? ? ?Thank you for allowing pharmacy to be a part of this patient?s care. ? ?36, PharmD ?Clinical Pharmacist ? ?

## 2021-11-25 LAB — CBC WITH DIFFERENTIAL/PLATELET
Abs Immature Granulocytes: 0.01 10*3/uL (ref 0.00–0.07)
Basophils Absolute: 0 10*3/uL (ref 0.0–0.1)
Basophils Relative: 1 %
Eosinophils Absolute: 0.1 10*3/uL (ref 0.0–0.5)
Eosinophils Relative: 1 %
HCT: 35.9 % — ABNORMAL LOW (ref 36.0–46.0)
Hemoglobin: 12.8 g/dL (ref 12.0–15.0)
Immature Granulocytes: 0 %
Lymphocytes Relative: 56 %
Lymphs Abs: 2.1 10*3/uL (ref 0.7–4.0)
MCH: 30.8 pg (ref 26.0–34.0)
MCHC: 35.7 g/dL (ref 30.0–36.0)
MCV: 86.5 fL (ref 80.0–100.0)
Monocytes Absolute: 0.3 10*3/uL (ref 0.1–1.0)
Monocytes Relative: 7 %
Neutro Abs: 1.3 10*3/uL — ABNORMAL LOW (ref 1.7–7.7)
Neutrophils Relative %: 35 %
Platelets: 189 10*3/uL (ref 150–400)
RBC: 4.15 MIL/uL (ref 3.87–5.11)
RDW: 12.4 % (ref 11.5–15.5)
WBC: 3.7 10*3/uL — ABNORMAL LOW (ref 4.0–10.5)
nRBC: 0 % (ref 0.0–0.2)

## 2021-11-25 LAB — PHOSPHORUS: Phosphorus: 2.6 mg/dL (ref 2.5–4.6)

## 2021-11-25 LAB — COMPREHENSIVE METABOLIC PANEL WITH GFR
ALT: 30 U/L (ref 0–44)
AST: 38 U/L (ref 15–41)
Albumin: 3.1 g/dL — ABNORMAL LOW (ref 3.5–5.0)
Alkaline Phosphatase: 54 U/L (ref 38–126)
Anion gap: 4 — ABNORMAL LOW (ref 5–15)
BUN: <5 mg/dL — ABNORMAL LOW (ref 6–20)
CO2: 27 mmol/L (ref 22–32)
Calcium: 8.3 mg/dL — ABNORMAL LOW (ref 8.9–10.3)
Chloride: 112 mmol/L — ABNORMAL HIGH (ref 98–111)
Creatinine, Ser: 0.54 mg/dL (ref 0.44–1.00)
GFR, Estimated: >60 mL/min
Glucose, Bld: 86 mg/dL (ref 70–99)
Potassium: 3 mmol/L — ABNORMAL LOW (ref 3.5–5.1)
Sodium: 143 mmol/L (ref 135–145)
Total Bilirubin: 0.6 mg/dL (ref 0.3–1.2)
Total Protein: 5.6 g/dL — ABNORMAL LOW (ref 6.5–8.1)

## 2021-11-25 LAB — MAGNESIUM: Magnesium: 1.8 mg/dL (ref 1.7–2.4)

## 2021-11-25 LAB — GLUCOSE, CAPILLARY
Glucose-Capillary: 74 mg/dL (ref 70–99)
Glucose-Capillary: 169 mg/dL — ABNORMAL HIGH (ref 70–99)
Glucose-Capillary: 140 mg/dL — ABNORMAL HIGH (ref 70–99)
Glucose-Capillary: 70 mg/dL (ref 70–99)

## 2021-11-25 NOTE — Progress Notes (Signed)
Inpatient Diabetes Program Recommendations ? ?AACE/ADA: New Consensus Statement on Inpatient Glycemic Control  ? ?Target Ranges:  Prepandial:   less than 140 mg/dL ?     Peak postprandial:   less than 180 mg/dL (1-2 hours) ?     Critically ill patients:  140 - 180 mg/dL  ? ? Latest Reference Range & Units 11/25/21 03:21 11/25/21 04:00 11/25/21 07:07  ?Glucose-Capillary 70 - 99 mg/dL 74 621 (H) 308 (H)  ? ? Latest Reference Range & Units 11/24/21 08:13 11/24/21 11:08 11/24/21 16:15 11/24/21 20:30 11/24/21 23:21  ?Glucose-Capillary 70 - 99 mg/dL 657 (H) 846 (H) 962 (H) 193 (H) 125 (H)  ? ?Review of Glycemic Control ? ?Diabetes history: DM1 (makes NO insulin; requires basal, correction, and carbohydrate coverage insulin) ?Outpatient Diabetes medications: Tresiba 30 units QHS, Novolog 0-15 units three to five times per day (1 unit for every 5 grams of carbs plus additional units for correction) ?Current orders for Inpatient glycemic control: Semglee 30 units QHS, Novolog 0-15 units Q4H ? ?Inpatient Diabetes Program Recommendations:   ? ?Insulin: Please consider decreasing Novolog to 0-9 units and change frequency to AC&HS. Please consider ordering Novolog 4 units TID with meals for meal coverage if patient eats at least 50% of meals. ? ?Thanks, ?Orlando Penner, RN, MSN, CDE ?Diabetes Coordinator ?Inpatient Diabetes Program ?[phone] (Team Pager from 8am to 5pm) ? ? ? ?

## 2021-11-25 NOTE — Discharge Summary (Signed)
Physician Discharge Summary  ?[name] ZOX:[id number] DOB: 07/05/2001 DOA: 11/22/2021 ? ?PCP: Kelli Davis, Kelli T, FNP ? ?Admit date: 11/22/2021 ?Discharge date: 11/25/2021 ? ?Time spent: 35 minutes ? ?Recommendations for Outpatient Follow-up:  ? ? ?Covid vaccination; vaccinated ?  ?  ?DKA/DM type II uncontrolled with hyperglycemia ?-Secondary to noncoherent with insulin ?-4/11 hemoglobin A1c= 14 ?-Continue insulin drip with Endo tool protocol until DKA resolved ?-Patient reported that she has been on Tresiba 30 units daily + NovoLog sliding scale.  And she does follow her endocrinology every 6 months. ?-4/11 Semglee 30 units, 2 hours after administration discontinue insulin drip ?- 4/12 increase Semglee 34 units daily ?-4/12 start moderate SSI (home regimen) ?-Discharged on home regimen ?  ?AKI ?-Prerenal, likely secondary to severe dehydration with signs of hemoconcentration ?-Received a total of 2 L IV bolus in ED, will give another 2 L of IV bolus and continue current maintenance IV fluids ?Lab Results  ?Component Value Date  ? CREATININE 0.54 11/25/2021  ? CREATININE 0.76 11/24/2021  ? CREATININE 0.63 11/24/2021  ? CREATININE 0.91 11/23/2021  ? CREATININE 1.05 (H) 11/23/2021  ?-Resolved ? ?Anxiety/depression ?-Denied any suicidal ideation, continue current regimen of SSRI. ?  ?Chronic iron deficiency anemia secondary to menorrhagia ?-Continue iron supplement. ?  ?Hypokalemia ?- Potassium goal> 4 ?-4/13 K-Dur 50 mEq prior to discharge ?  ?Hypomagnesmia ?-Magnesium goal> 2 ?Resolved ?Hypophosphatemia ?-Phosphorus goal> 2.5 ?-Resolved ?  ?Metabolic acidosis ?- Secondary to DKA. ?- Resolved ? ? ? ?Discharge Diagnoses:  ?Principal Problem: ?  DKA (diabetic ketoacidosis) (HCC) ?Active Problems: ?  Uncontrolled type 2 diabetes mellitus with hyperglycemia (HCC) ?  AKI (acute kidney injury) (HCC) ?  Anxiety ?  Depression ?  Iron deficiency anemia due to chronic blood loss ?  Menorrhagia ? ? ?Discharge Condition:  Stable ? ?Diet recommendation: Carb modified ? ?Filed Weights  ? 11/22/21 1754 11/23/21 1220 11/23/21 1746  ?Weight: 59 kg 59 kg 56.1 kg  ? ? ?History of present illness:  ?[name] is a 21 y.o. WF PMHx Anxiety/Depression, ADD, eating disorder, physical growth delay, IDDM uncontrolled with hyperglycemia, chronic iron deficiency anemia secondary to menorrhagia, , came with DKA. ?  ?Patient reported that she had a quarry with family member and she left home two nights ago, but she forgot to take her insulin with her. Started from yesterday she developed epigastric cramping, frequent nauseous vomiting up stomach content, normal bowel nonbloody.  She has not been able to eat or drink since yesterday because of GI symptoms.  Denies any fever or chills.  Patient denies any suicidal ideation or plan at this point. ? ?Hospital Course:  ?See above ? ? ? ? ?Discharge Exam: ?Vitals:  ? 11/24/21 2318 11/25/21 0324 11/25/21 0325 11/25/21 0709  ?BP: 114/68 108/69  116/88  ?Pulse:  75    ?Resp: 20 12 10 19   ?Temp: 98.7 ?F (37.1 ?C) 98 ?F (36.7 ?C)  98 ?F (36.7 ?C)  ?TempSrc: Oral Oral  Oral  ?SpO2: 98% 100%  97%  ?Weight:      ?Height:      ? ?General: A/O x4, No acute respiratory distress ?Eyes: negative scleral hemorrhage, negative anisocoria, negative icterus ?ENT: Negative Runny nose, negative gingival bleeding, ?Neck:  Negative scars, masses, torticollis, lymphadenopathy, JVD ?Lungs: Clear to auscultation bilaterally without wheezes or crackles ?Cardiovascular: Regular rate and rhythm without murmur gallop or rub normal S1 and S2 ? ?Discharge Instructions ? ? ?Allergies as of 11/25/2021   ? ?   Reactions  ?  Penicillins Hives  ? Did it involve swelling of the face/tongue/throat, SOB, or low BP? Yes ?Did it involve sudden or severe rash/hives, skin peeling, or any reaction on the inside of your mouth or nose? Yes ?Did you need to seek medical attention at a hospital or doctor's office? Yes ?When did it last happen?    today   ?If  all above answers are "NO", may proceed with cephalosporin use.  ? ?  ? ?  ?Medication List  ?  ? ?TAKE these medications   ? ?Accu-Chek FastClix Lancets Misc ?Check sugar 10 x daily ?  ?Baqsimi One Pack 3 MG/DOSE Powd ?Generic drug: Glucagon ?Place 1 Dose into the nose as needed. ?What changed: reasons to take this ?  ?BD Pen Needle Nano U/F 32G X 4 MM Misc ?Generic drug: Insulin Pen Needle ?Use with insulin pens 6x daily ?  ?buPROPion 300 MG 24 hr tablet ?Commonly known as: WELLBUTRIN XL ?TAKE 1 TABLET BY MOUTH EVERY DAY ?  ?Dexcom G6 Sensor Misc ?1 APPLICATION BY DOES NOT APPLY ROUTE AS NEEDED. ?  ?Dexcom G6 Transmitter Misc ?1 application by Does not apply route continuous as needed. ?  ?escitalopram 20 MG tablet ?Commonly known as: LEXAPRO ?TAKE 1 TABLET BY MOUTH EVERY DAY ?  ?fluticasone 50 MCG/ACT nasal spray ?Commonly known as: FLONASE ?Place 1 spray into both nostrils daily as needed for allergies. ?  ?glucose blood test strip ?Use as instructed ?  ?hydrOXYzine 10 MG tablet ?Commonly known as: ATARAX ?TAKE 1 TABLET BY MOUTH EVERYDAY AT BEDTIME ?What changed: See the new instructions. ?  ?norgestimate-ethinyl estradiol 0.25-35 MG-MCG tablet ?Commonly known as: Sprintec 28 ?TAKE 1 TABLET DAILY. DISCARD PLACEBOS AND TAKE ACTIVE PILLS FOR CONTINUOUS CYCLING ?What changed:  ?how much to take ?how to take this ?when to take this ?additional instructions ?  ?NovoLOG FlexPen 100 UNIT/ML FlexPen ?Generic drug: insulin aspart ?INJECT UP TO 50 UNITS SUBCUTANEOUSLY DAILY ?What changed:  ?how much to take ?when to take this ?additional instructions ?  ?pantoprazole 40 MG tablet ?Commonly known as: Protonix ?Take 1 tablet (40 mg total) by mouth daily. ?  ?Polysaccharide-Iron Complex 150 MG Caps ?Take 1 capsule by mouth daily. ?What changed: how much to take ?  ?prazosin 2 MG capsule ?Commonly known as: MINIPRESS ?TAKE 1 CAPSULE BY MOUTH AT BEDTIME. ?  ?Evaristo Bury FlexTouch 100 UNIT/ML FlexTouch Pen ?Generic drug: insulin  degludec ?Inject 30 Units into the skin at bedtime. Inject up to 50 units per day SubQ ?What changed: additional instructions ?  ? ?  ? ?Allergies  ?Allergen Reactions  ? Penicillins Hives  ?  Did it involve swelling of the face/tongue/throat, SOB, or low BP? Yes ?Did it involve sudden or severe rash/hives, skin peeling, or any reaction on the inside of your mouth or nose? Yes ?Did you need to seek medical attention at a hospital or doctor's office? Yes ?When did it last happen?    today   ?If all above answers are "NO", may proceed with cephalosporin use. ? ?  ? ? ? ? ?The results of significant diagnostics from this hospitalization (including imaging, microbiology, ancillary and laboratory) are listed below for reference.   ? ?Significant Diagnostic Studies: ?No results found. ? ?Microbiology: ?Recent Results (from the past 240 hour(s))  ?MRSA Next Gen by PCR, Nasal     Status: None  ? Collection Time: 11/23/21  5:45 PM  ? Specimen: Nasal Mucosa; Nasal Swab  ?Result Value Ref Range Status  ?  MRSA by PCR Next Gen NOT DETECTED NOT DETECTED Final  ?  Comment: (NOTE) ?The GeneXpert MRSA Assay (FDA approved for NASAL specimens only), ?is one component of a comprehensive MRSA colonization surveillance ?program. It is not intended to diagnose MRSA infection nor to guide ?or monitor treatment for MRSA infections. ?Test performance is not FDA approved in patients less than 2 years ?old. ?Performed at [facility], [address] [place], [place] ?[address] ?  ?  ? ?Labs: ?Basic Metabolic Panel: ?Recent Labs  ?Lab 11/23/21 ?8295 11/23/21 ?0806 11/23/21 ?1851 11/24/21 ?0415 11/24/21 ?1300 11/25/21 ?0330  ?NA 140 139  --  139 137 143  ?K 3.9 3.5  --  2.5* 3.5 3.0*  ?CL 117* 116*  --  110 108 112*  ?CO2 11* 15*  --  22 23 27   ?GLUCOSE 205* 171*  --  174* 311* 86  ?BUN 7 5*  --  <5* <5* <5*  ?CREATININE 1.05* 0.91  --  0.63 0.76 0.54  ?CALCIUM 7.7* 8.0*  --  7.6* 7.6* 8.3*  ?MG  --   --  1.4* 1.2* 1.9 1.8  ?PHOS  --   --   1.5* 1.7* 2.9 2.6  ? ?Liver Function Tests: ?Recent Labs  ?Lab 11/24/21 ?0415 11/25/21 ?0330  ?AST 16 38  ?ALT 15 30  ?ALKPHOS 51 54  ?BILITOT 0.5 0.6  ?PROT 5.2* 5.6*  ?ALBUMIN 2.8* 3.1*  ? ?No results for

## 2021-11-25 NOTE — Progress Notes (Signed)
Hypoglycemic Event ? ?CBG: 74 ? ?Treatment: 8 oz juice/soda ? ?Symptoms:  Patient woke up from sleep stating she does not feel well.  ? ?Follow-up CBG: Time:0400 CBG Result:169 ? ?Possible Reasons for Event: Inadequate meal intake ? ?Comments/MD notified: Patient Stable ? ? ? ?Jonavin Seder ? ? ?

## 2021-11-25 NOTE — Progress Notes (Signed)
RN went over d/c summary w/ pt. NT removing PIV. Pt's dad will transport pt home in private vehicle. Belongings w/ pt. NT accompanying pt to d/c lounge. ?

## 2021-12-02 ENCOUNTER — Ambulatory Visit: Payer: BC Managed Care – PPO | Admitting: Pediatrics

## 2021-12-02 ENCOUNTER — Encounter: Payer: Self-pay | Admitting: Pediatrics

## 2021-12-02 VITALS — BP 117/80 | HR 84 | Ht 59.5 in | Wt 122.6 lb

## 2021-12-02 DIAGNOSIS — F321 Major depressive disorder, single episode, moderate: Secondary | ICD-10-CM

## 2021-12-02 DIAGNOSIS — E1065 Type 1 diabetes mellitus with hyperglycemia: Secondary | ICD-10-CM | POA: Diagnosis not present

## 2021-12-02 DIAGNOSIS — F419 Anxiety disorder, unspecified: Secondary | ICD-10-CM

## 2021-12-02 DIAGNOSIS — F902 Attention-deficit hyperactivity disorder, combined type: Secondary | ICD-10-CM | POA: Diagnosis not present

## 2021-12-02 NOTE — Progress Notes (Signed)
History was provided by the patient. ? ?Kelli Davis is a 21 y.o. female who is here for MDD, GAD, recent hospitalization for DKA, ADHD, weight loss.  ?Alfonso Ramus T, FNP  ? ?HPI:  Pt reports she is taking her medications every day. She missed her insulin, long acting and short acting, had food poisoning the night before and was on her menstrual cycle and went into DKA. She got into an argument with mother because mother hadn't been able to pay heating bill. Is back staying with mom now but having a better relationship with dad.  ? ?Appetite has been lower because she is always surrounded by food at work. Sometimes won't eat until she gets off work and then tends to "go crazy" when she gets off work and overeat. Having some very high sugars but also some lows.  ? ?She has a therapist through [facility] and has a good relationship with her. Therapist has been getting on her about missing food and insulin as a form of self harm!  ? ?No LMP recorded. (Menstrual status: Oral contraceptives). ? ? ?Patient Active Problem List  ? Diagnosis Date Noted  ? Uncontrolled type 2 diabetes mellitus with hyperglycemia (HCC) 11/24/2021  ? AKI (acute kidney injury) (HCC) 11/24/2021  ? Anxiety 11/24/2021  ? Depression 11/24/2021  ? Iron deficiency anemia due to chronic blood loss 11/24/2021  ? Menorrhagia 11/24/2021  ? DKA (diabetic ketoacidosis) (HCC) 07/21/2021  ? Current moderate episode of major depressive disorder without prior episode (HCC) 05/25/2021  ? Vitamin D deficiency 05/05/2020  ? Insulin dose changed (HCC) 09/03/2018  ? Mixed hyperlipidemia 09/03/2018  ? Generalized anxiety disorder 09/04/2017  ? Oral contraceptive pill surveillance 09/04/2017  ? Eating disorder 05/16/2016  ? Insomnia 04/12/2016  ? ADHD (attention deficit hyperactivity disorder), combined type 05/20/2015  ? Maladaptive health behaviors affecting medical condition 10/17/2013  ? Hypoglycemia associated with diabetes (HCC)   ? Uncontrolled type 1  diabetes mellitus with hyperglycemia (HCC) 12/13/2010  ? ? ?Current Outpatient Medications on File Prior to Visit  ?Medication Sig Dispense Refill  ? ACCU-CHEK FASTCLIX LANCETS MISC Check sugar 10 x daily 300 each 3  ? buPROPion (WELLBUTRIN XL) 300 MG 24 hr tablet TAKE 1 TABLET BY MOUTH EVERY DAY (Patient taking differently: Take 300 mg by mouth daily.) 90 tablet 1  ? Continuous Blood Gluc Sensor (DEXCOM G6 SENSOR) MISC 1 APPLICATION BY DOES NOT APPLY ROUTE AS NEEDED. 3 each 5  ? Continuous Blood Gluc Transmit (DEXCOM G6 TRANSMITTER) MISC 1 application by Does not apply route continuous as needed. 1 each 3  ? escitalopram (LEXAPRO) 20 MG tablet TAKE 1 TABLET BY MOUTH EVERY DAY (Patient taking differently: Take 20 mg by mouth daily.) 90 tablet 1  ? fluticasone (FLONASE) 50 MCG/ACT nasal spray Place 1 spray into both nostrils daily as needed for allergies.    ? Glucagon (BAQSIMI ONE PACK) 3 MG/DOSE POWD Place 1 Dose into the nose as needed. (Patient taking differently: Place 1 Dose into the nose as needed (low blood sugar).) 2 each 1  ? glucose blood test strip Use as instructed 300 each 3  ? hydrOXYzine (ATARAX) 10 MG tablet TAKE 1 TABLET BY MOUTH EVERYDAY AT BEDTIME (Patient taking differently: Take 10 mg by mouth at bedtime.) 90 tablet 2  ? insulin aspart (NOVOLOG FLEXPEN) 100 UNIT/ML FlexPen INJECT UP TO 50 UNITS SUBCUTANEOUSLY DAILY (Patient taking differently: 0-15 Units See admin instructions. Three to five times daily with each meal. Per sliding scale) 15  mL 5  ? insulin degludec (TRESIBA FLEXTOUCH) 100 UNIT/ML FlexTouch Pen Inject 30 Units into the skin at bedtime. Inject up to 50 units per day SubQ (Patient taking differently: Inject 30 Units into the skin at bedtime.)    ? Insulin Pen Needle (BD PEN NEEDLE NANO U/F) 32G X 4 MM MISC Use with insulin pens 6x daily 200 each 6  ? norgestimate-ethinyl estradiol (SPRINTEC 28) 0.25-35 MG-MCG tablet TAKE 1 TABLET DAILY. DISCARD PLACEBOS AND TAKE ACTIVE PILLS FOR  CONTINUOUS CYCLING (Patient taking differently: Take 1 tablet by mouth daily.) 112 tablet 4  ? pantoprazole (PROTONIX) 40 MG tablet Take 1 tablet (40 mg total) by mouth daily. 30 tablet 2  ? Polysaccharide-Iron Complex 150 MG CAPS Take 1 capsule by mouth daily. (Patient taking differently: Take 150 mg by mouth daily.) 30 capsule 3  ? prazosin (MINIPRESS) 2 MG capsule TAKE 1 CAPSULE BY MOUTH AT BEDTIME. (Patient taking differently: Take 2 mg by mouth at bedtime.) 90 capsule 1  ? ?No current facility-administered medications on file prior to visit.  ? ? ?Allergies  ?Allergen Reactions  ? Penicillins Hives  ?  Did it involve swelling of the face/tongue/throat, SOB, or low BP? Yes ?Did it involve sudden or severe rash/hives, skin peeling, or any reaction on the inside of your mouth or nose? Yes ?Did you need to seek medical attention at a hospital or doctor's office? Yes ?When did it last happen?    today   ?If all above answers are "NO", may proceed with cephalosporin use. ? ?  ? ? ?Physical Exam:  ?  ?Vitals:  ? 12/02/21 1503  ?BP: 117/80  ?Pulse: 84  ?Weight: 122 lb 9.6 oz (55.6 kg)  ?Height: 4' 11.5" (1.511 m)  ? ? ?Growth percentile SmartLinks can only be used for patients less than 2 years old. ? ?Physical Exam ?Vitals and nursing note reviewed.  ?Constitutional:   ?   General: She is not in acute distress. ?   Appearance: She is well-developed.  ?Neck:  ?   Thyroid: No thyromegaly.  ?Cardiovascular:  ?   Rate and Rhythm: Normal rate and regular rhythm.  ?   Heart sounds: No murmur heard. ?Pulmonary:  ?   Breath sounds: Normal breath sounds.  ?Abdominal:  ?   Palpations: Abdomen is soft. There is no mass.  ?   Tenderness: There is no abdominal tenderness. There is no guarding.  ?Musculoskeletal:  ?   Right lower leg: No edema.  ?   Left lower leg: No edema.  ?Lymphadenopathy:  ?   Cervical: No cervical adenopathy.  ?Skin: ?   General: Skin is warm.  ?   Findings: No rash.  ?Neurological:  ?   Mental Status: She  is alert.  ?   Comments: No tremor  ?Psychiatric:     ?   Mood and Affect: Mood and affect normal.  ? ? ?Assessment/Plan: ?1. Current moderate episode of major depressive disorder without prior episode (HCC) ?Overall depression is fairly stable and she is finally seeing a therapist. Continue wellbutrin and lexapro  ? ?2. Anxiety ?As above.  ? ?3. Uncontrolled type 1 diabetes mellitus with hyperglycemia (HCC) ?Got her back on the schedule to see endocrine. A1C was >14 when she was admitted. Discussed need to continue working to care for herself.  ? ?4. ADHD (attention deficit hyperactivity disorder), combined type ?Continue wellbutrin.  ? ?Return in 3 weeks or sooner as needed  ? ?Alfonso Ramus, FNP ? ? ? ?

## 2021-12-02 NOTE — Patient Instructions (Signed)
[employer]: [phone], option 3- call and see about patient assistance for pump  ?

## 2021-12-08 ENCOUNTER — Encounter (INDEPENDENT_AMBULATORY_CARE_PROVIDER_SITE_OTHER): Payer: Self-pay | Admitting: Family

## 2021-12-08 ENCOUNTER — Ambulatory Visit (INDEPENDENT_AMBULATORY_CARE_PROVIDER_SITE_OTHER): Payer: BC Managed Care – PPO | Admitting: Family

## 2021-12-08 VITALS — BP 112/68 | HR 84 | Wt 121.8 lb

## 2021-12-08 DIAGNOSIS — Z91199 Patient's noncompliance with other medical treatment and regimen due to unspecified reason: Secondary | ICD-10-CM

## 2021-12-08 DIAGNOSIS — E1065 Type 1 diabetes mellitus with hyperglycemia: Secondary | ICD-10-CM

## 2021-12-08 DIAGNOSIS — F419 Anxiety disorder, unspecified: Secondary | ICD-10-CM | POA: Diagnosis not present

## 2021-12-08 DIAGNOSIS — F32A Depression, unspecified: Secondary | ICD-10-CM

## 2021-12-08 DIAGNOSIS — E782 Mixed hyperlipidemia: Secondary | ICD-10-CM | POA: Diagnosis not present

## 2021-12-08 LAB — POCT GLUCOSE (DEVICE FOR HOME USE): POC Glucose: 225 mg/dl — AB (ref 70–99)

## 2021-12-08 NOTE — Progress Notes (Signed)
Pediatric Endocrinology Diabetes Consultation Follow-up Visit ? ?Kelli Davis ?03/28/2001 ?[id number] ? ?Chief Complaint: Follow-up type 1 diabetes ? ? ?Kelli Skill, FNP ? ? ?HPI: ?Kelli Davis  is a 21 y.o. female presenting for follow-up of type 1 diabetes. she is accompanied to this visit by her mother and father.  ? ?1. Kelli Davis was diagnosed with type 1 diabetes at age 39. At that time she was hospitalized at [facility] center and was in DKA. She was in the ICU for 2 days. She was initially followed by Dr. Langston Masker in [place] but transferred to this clinic after Dr. Langston Masker retired. She has been admitted in DKA two additional times since diagnosis. She has been on pump therapy since age 53. ? ?2. Since last visit to [facility] on 102022, she has been "ok". She was admitted to hospital on 11/22/2021 for DKA which she reports was due to food poisoning although her hemoglobin A1c of >14% at the tim also indicates that she was not consistently taking insulin.  ? ?She is working at [employer] for around 40 hours per week. She moved back in with her mom about 2 months ago. She has started seeing a therapist named Kelli Davis at an online agency called [facility]. She feels like it has been very good so far. She sees her once per week. She feels like she has made a lot of progress with depression and grief. She takes lexapro and Wellbutrin daily.  ? ?Since her hospitalization she feels like she is doing a little bit better with diabetes care. She feels like she gets distracted when she is at work. She also feels like she may be doing self harm by not take of her diabetes. She has Dexcom CGM but reports that it is failing after 3-4 day--> says sensor failed and change new. (We figured out today that she has been closing the app which may be part of the problem).  ? ?Evaristo Bury: she is taking in the morning. Estimates she missed 3-4 doses per week.  ?Novolog: Estimates she is taking 2-3 x per day at most. She is rarely  giving corrections. Usually gives when she eats.  ? ? ?Insulin regimen: 30  units of Tresiba. Novolog 120/30/5 ?Hypoglycemia: Able to feel low blood sugars.  No glucagon needed recently.  ?Dexcom CGm Download  ? ? ?Med-alert ID: Not currently wearing. ?Injection sites: arms and hips  ?Annual labs due: 2023 ?Ophthalmology due: 2019. Discussed importance of annual dilated eye exam.  ? ?  ?3. ROS: Greater than 10 systems reviewed with pertinent positives listed in HPI, otherwise neg. ?Constitutional: Reports energy is improving  ?Eyes: No changes in vision, denies blurry vision. Wears glasses.  ?Ears/Nose/Mouth/Throat: No difficulty swallowing. No neck pain  ?Cardiovascular: No palpitations, denies tachycardia  ?Respiratory: No increased work of breathing, No SOB  ?Gastrointestinal: No constipation or diarrhea. No abdominal pain ?Genitourinary: No nocturia, no polyuria ?Endocrine: No polydipsia.  No hyperpigmentation ?Psychiatric: Normal affect.+ anxiety and depression.  ? ?Past Medical History:   ?Past Medical History:  ?Diagnosis Date  ? ADD (attention deficit disorder)   ? Eating disorder   ? Febrile seizure (HCC)   ? History of eye surgery   ? Physical growth delay   ? Type 1 diabetes mellitus not at goal [facility])   ? ? ?Medications:  ?Outpatient Encounter Medications as of 12/08/2021  ?Medication Sig  ? ACCU-CHEK FASTCLIX LANCETS MISC Check sugar 10 x daily  ? buPROPion (WELLBUTRIN XL) 300 MG 24 hr tablet  TAKE 1 TABLET BY MOUTH EVERY DAY  ? Continuous Blood Gluc Sensor (DEXCOM G6 SENSOR) MISC 1 APPLICATION BY DOES NOT APPLY ROUTE AS NEEDED.  ? Continuous Blood Gluc Transmit (DEXCOM G6 TRANSMITTER) MISC 1 application by Does not apply route continuous as needed.  ? escitalopram (LEXAPRO) 20 MG tablet TAKE 1 TABLET BY MOUTH EVERY DAY  ? fluticasone (FLONASE) 50 MCG/ACT nasal spray Place 1 spray into both nostrils daily as needed for allergies.  ? insulin aspart (NOVOLOG FLEXPEN) 100 UNIT/ML FlexPen INJECT UP TO 50 UNITS  SUBCUTANEOUSLY DAILY (Patient taking differently: 0-15 Units See admin instructions. Three to five times daily with each meal. Per sliding scale)  ? insulin degludec (TRESIBA FLEXTOUCH) 100 UNIT/ML FlexTouch Pen Inject 30 Units into the skin at bedtime. Inject up to 50 units per day SubQ (Patient taking differently: Inject 30 Units into the skin at bedtime.)  ? Insulin Pen Needle (BD PEN NEEDLE NANO U/F) 32G X 4 MM MISC Use with insulin pens 6x daily  ? norgestimate-ethinyl estradiol (SPRINTEC 28) 0.25-35 MG-MCG tablet TAKE 1 TABLET DAILY. DISCARD PLACEBOS AND TAKE ACTIVE PILLS FOR CONTINUOUS CYCLING (Patient taking differently: Take 1 tablet by mouth daily.)  ? pantoprazole (PROTONIX) 40 MG tablet Take 1 tablet (40 mg total) by mouth daily.  ? Polysaccharide-Iron Complex 150 MG CAPS Take 1 capsule by mouth daily. (Patient taking differently: Take 150 mg by mouth daily.)  ? prazosin (MINIPRESS) 2 MG capsule TAKE 1 CAPSULE BY MOUTH AT BEDTIME. (Patient taking differently: Take 2 mg by mouth at bedtime.)  ? Glucagon (BAQSIMI ONE PACK) 3 MG/DOSE POWD Place 1 Dose into the nose as needed. (Patient not taking: Reported on 12/08/2021)  ? glucose blood test strip Use as instructed (Patient not taking: Reported on 12/08/2021)  ? hydrOXYzine (ATARAX) 10 MG tablet TAKE 1 TABLET BY MOUTH EVERYDAY AT BEDTIME (Patient not taking: Reported on 12/08/2021)  ? ?No facility-administered encounter medications on file as of 12/08/2021.  ? ? ?Allergies: ?Allergies  ?Allergen Reactions  ? Penicillins Hives  ?  Did it involve swelling of the face/tongue/throat, SOB, or low BP? Yes ?Did it involve sudden or severe rash/hives, skin peeling, or any reaction on the inside of your mouth or nose? Yes ?Did you need to seek medical attention at a hospital or doctor's office? Yes ?When did it last happen?    today   ?If all above answers are "NO", may proceed with cephalosporin use. ? ?  ? ? ?Surgical History: ?Past Surgical History:  ?Procedure  Laterality Date  ? EYE MUSCLE SURGERY    ? ? ?Family History:  ?Family History  ?Problem Relation Age of Onset  ? Cancer Maternal Grandmother   ? Hypertension Maternal Grandfather   ? Depression Maternal Grandfather   ? Diabetes Paternal Grandfather   ? Anxiety disorder Father   ? Depression Mother   ? Anxiety disorder Sister   ? ?  ?Social History: ?Lives with: Living with friends.  ? ? ?Physical Exam:  ?Vitals:  ? 12/08/21 1010  ?BP: 112/68  ?Pulse: 84  ?Weight: 121 lb 12.8 oz (55.2 kg)  ? ? ? ? ? ?BP 112/68 (BP Location: Left Arm, Patient Position: Sitting, Cuff Size: Normal)   Pulse 84   Wt 121 lb 12.8 oz (55.2 kg)   BMI 24.19 kg/m?  ?Body mass index: body mass index is 24.19 kg/m?Marland Kitchen ?Growth percentile SmartLinks can only be used for patients less than 26 years old. ? ?Ht Readings from Last 3 Encounters:  ?  12/02/21 4' 11.5" (1.511 m)  ?11/23/21 4\' 11"  (1.499 m)  ?08/19/21 4' 11.55" (1.513 m)  ? ?Wt Readings from Last 3 Encounters:  ?12/08/21 121 lb 12.8 oz (55.2 kg)  ?12/02/21 122 lb 9.6 oz (55.6 kg)  ?11/23/21 123 lb 10.9 oz (56.1 kg)  ? ?Physical Exam  ?General: Well developed, well nourished female in no acute distress.  ?Head: Normocephalic, atraumatic.   ?Eyes:  Pupils equal and round. EOMI.   Sclera white.  No eye drainage.   ?Ears/Nose/Mouth/Throat: Nares patent, no nasal drainage.  Normal dentition, mucous membranes moist.   ?Neck: supple, no cervical lymphadenopathy, no thyromegaly ?Cardiovascular: regular rate, normal S1/S2, no murmurs ?Respiratory: No increased work of breathing.  Lungs clear to auscultation bilaterally.  No wheezes. ?Abdomen: soft, nontender, nondistended. No appreciable masses  ?Extremities: warm, well perfused, cap refill < 2 sec.   ?Musculoskeletal: Normal muscle mass.  Normal strength ?Skin: warm, dry.  No rash or lesions. ?Neurologic: alert and oriented, normal speech, no tremor ? ? ? ?Labs: ? ? ? ?Assessment/Plan: ?Delsie is a 21 y.o. female with type 1 diabetes on MDI and  CGM therapy. She has had multiple episodes of DKA over the 4 months and is experiencing frequent, at times sever, hyperglycemia due to noncompliance complicated by depression and grief. Her most recently hem

## 2021-12-08 NOTE — Patient Instructions (Signed)
-   Tresiba 30 units  ?- Novolog per plan  ?- Try freestyle libre  ?- Let me know about insulin pump when ready ? ?It was a pleasure seeing you in clinic today. Please do not hesitate to contact me if you have questions or concerns.  ? ?Please sign up for MyChart. This is a communication tool that allows you to send an email directly to me. This can be used for questions, prescriptions and blood sugar reports. We will also release labs to you with instructions on MyChart. Please do not use MyChart if you need immediate or emergency assistance. Ask our wonderful front office staff if you need assistance.  ? ?

## 2021-12-30 ENCOUNTER — Ambulatory Visit: Payer: BC Managed Care – PPO | Admitting: Pediatrics

## 2022-01-05 ENCOUNTER — Ambulatory Visit (INDEPENDENT_AMBULATORY_CARE_PROVIDER_SITE_OTHER): Payer: BC Managed Care – PPO | Admitting: Family

## 2022-01-05 NOTE — Progress Notes (Unsigned)
Pediatric Endocrinology Diabetes Consultation Follow-up Visit  Kelli Davis 09/09/2000 [id number]  Chief Complaint: Follow-up type 1 diabetes   Verneda SkillHacker, Caroline T, FNP   HPI: Kelli Davis  is a 21 y.o. female presenting for follow-up of type 1 diabetes. she is accompanied to this visit by her mother and father.   1. Kelli Davis with type 1 diabetes at age 89. At that time she was hospitalized at [facility] and was in DKA. She was in the ICU for 2 days. She was initially followed by Dr. Langston MaskerMorris in [place] but transferred to this clinic after Dr. Langston MaskerMorris retired. She has been admitted in DKA two additional times since diagnosis. She has been on pump therapy since age 89.  2. Since last visit to [facility] on 11/2021, she has been "ok".   She was admitted to hospital on 11/22/2021 for DKA which she reports was due to food poisoning although her hemoglobin A1c of >14% at the tim also indicates that she was not consistently taking insulin.   She is working at [employer] for around 40 hours per week. She moved back in with her mom about 2 months ago. She has started seeing a therapist named Kelli Davis at an online agency called [facility]. She feels like it has been very good so far. She sees her once per week. She feels like she has made a lot of progress with depression and grief. She takes lexapro and Wellbutrin daily.   Since her hospitalization she feels like she is doing a little bit better with diabetes care. She feels like she gets distracted when she is at work. She also feels like she may be doing self harm by not take of her diabetes. She has Dexcom CGM but reports that it is failing after 3-4 day--> says sensor failed and change new. (We figured out today that she has been closing the app which may be part of the problem).   Kelli Davis: she is taking in the morning. Estimates she missed 3-4 doses per week.  Novolog: Estimates she is taking 2-3 x per day at most. She is  rarely giving corrections. Usually gives when she eats.    Insulin regimen: 30  units of Tresiba. Novolog 120/30/5 Hypoglycemia: Able to feel low blood sugars.  No glucagon needed recently.  Dexcom CGm Download    Med-alert ID: Not currently wearing. Injection sites: arms and hips  Annual labs due: 2023 Ophthalmology due: 2019. Discussed importance of annual dilated eye exam.     3. ROS: Greater than 10 systems reviewed with pertinent positives listed in HPI, otherwise neg. Constitutional: Reports energy is improving  Eyes: No changes in vision, denies blurry vision. Wears glasses.  Ears/Nose/Mouth/Throat: No difficulty swallowing. No neck pain  Cardiovascular: No palpitations, denies tachycardia  Respiratory: No increased work of breathing, No SOB  Gastrointestinal: No constipation or diarrhea. No abdominal pain Genitourinary: No nocturia, no polyuria Endocrine: No polydipsia.  No hyperpigmentation Psychiatric: Normal affect.+ anxiety and depression.   Past Medical History:   Past Medical History:  Diagnosis Date   ADD (attention deficit disorder)    Eating disorder    Febrile seizure (HCC)    History of eye surgery    Physical growth delay    Type 1 diabetes mellitus not at goal [facility](HCC)     Medications:  Outpatient Encounter Medications as of 01/05/2022  Medication Sig   ACCU-CHEK FASTCLIX LANCETS MISC Check sugar 10 x daily   buPROPion (WELLBUTRIN XL) 300 MG 24  hr tablet TAKE 1 TABLET BY MOUTH EVERY DAY   Continuous Blood Gluc Sensor (DEXCOM G6 SENSOR) MISC 1 APPLICATION BY DOES NOT APPLY ROUTE AS NEEDED.   Continuous Blood Gluc Transmit (DEXCOM G6 TRANSMITTER) MISC 1 application by Does not apply route continuous as needed.   escitalopram (LEXAPRO) 20 MG tablet TAKE 1 TABLET BY MOUTH EVERY DAY   fluticasone (FLONASE) 50 MCG/ACT nasal spray Place 1 spray into both nostrils daily as needed for allergies.   Glucagon (BAQSIMI ONE PACK) 3 MG/DOSE POWD Place 1 Dose into the  nose as needed. (Patient not taking: Reported on 12/08/2021)   glucose blood test strip Use as instructed (Patient not taking: Reported on 12/08/2021)   hydrOXYzine (ATARAX) 10 MG tablet TAKE 1 TABLET BY MOUTH EVERYDAY AT BEDTIME (Patient not taking: Reported on 12/08/2021)   insulin aspart (NOVOLOG FLEXPEN) 100 UNIT/ML FlexPen INJECT UP TO 50 UNITS SUBCUTANEOUSLY DAILY (Patient taking differently: 0-15 Units See admin instructions. Three to five times daily with each meal. Per sliding scale)   insulin degludec (TRESIBA FLEXTOUCH) 100 UNIT/ML FlexTouch Pen Inject 30 Units into the skin at bedtime. Inject up to 50 units per day SubQ (Patient taking differently: Inject 30 Units into the skin at bedtime.)   Insulin Pen Needle (BD PEN NEEDLE NANO U/F) 32G X 4 MM MISC Use with insulin pens 6x daily   norgestimate-ethinyl estradiol (SPRINTEC 28) 0.25-35 MG-MCG tablet TAKE 1 TABLET DAILY. DISCARD PLACEBOS AND TAKE ACTIVE PILLS FOR CONTINUOUS CYCLING (Patient taking differently: Take 1 tablet by mouth daily.)   pantoprazole (PROTONIX) 40 MG tablet Take 1 tablet (40 mg total) by mouth daily.   Polysaccharide-Iron Complex 150 MG CAPS Take 1 capsule by mouth daily. (Patient taking differently: Take 150 mg by mouth daily.)   prazosin (MINIPRESS) 2 MG capsule TAKE 1 CAPSULE BY MOUTH AT BEDTIME. (Patient taking differently: Take 2 mg by mouth at bedtime.)   No facility-administered encounter medications on file as of 01/05/2022.    Allergies: Allergies  Allergen Reactions   Penicillins Hives    Did it involve swelling of the face/tongue/throat, SOB, or low BP? Yes Did it involve sudden or severe rash/hives, skin peeling, or any reaction on the inside of your mouth or nose? Yes Did you need to seek medical attention at a hospital or doctor's office? Yes When did it last happen?    today   If all above answers are "NO", may proceed with cephalosporin use.      Surgical History: Past Surgical History:   Procedure Laterality Date   EYE MUSCLE SURGERY      Family History:  Family History  Problem Relation Age of Onset   Cancer Maternal Grandmother    Hypertension Maternal Grandfather    Depression Maternal Grandfather    Diabetes Paternal Grandfather    Anxiety disorder Father    Depression Mother    Anxiety disorder Sister      Social History: Lives with: Living with friends.    Physical Exam:  There were no vitals filed for this visit.      There were no vitals taken for this visit. Body mass index: body mass index is unknown because there is no height or weight on file. Growth percentile SmartLinks can only be used for patients less than 35 years old.  Ht Readings from Last 3 Encounters:  12/02/21 4' 11.5" (1.511 m)  11/23/21 4\' 11"  (1.499 m)  08/19/21 4' 11.55" (1.513 m)   Wt Readings from Last 3  Encounters:  12/08/21 121 lb 12.8 oz (55.2 kg)  12/02/21 122 lb 9.6 oz (55.6 kg)  11/23/21 123 lb 10.9 oz (56.1 kg)   Physical Exam  General: Well developed, well nourished female in no acute distress.   Head: Normocephalic, atraumatic.   Eyes:  Pupils equal and round. EOMI.   Sclera white.  No eye drainage.   Ears/Nose/Mouth/Throat: Nares patent, no nasal drainage.  Normal dentition, mucous membranes moist.   Neck: supple, no cervical lymphadenopathy, no thyromegaly Cardiovascular: regular rate, normal S1/S2, no murmurs Respiratory: No increased work of breathing.  Lungs clear to auscultation bilaterally.  No wheezes. Abdomen: soft, nontender, nondistended. No appreciable masses  Extremities: warm, well perfused, cap refill < 2 sec.   Musculoskeletal: Normal muscle mass.  Normal strength Skin: warm, dry.  No rash or lesions. Neurologic: alert and oriented, normal speech, no tremor   Labs:    Assessment/Plan: Heran is a 21 y.o. female with type 1 diabetes on MDI and CGM therapy. She has had multiple episodes of DKA over the 4 months and is experiencing  frequent, at times sever, hyperglycemia due to noncompliance complicated by depression and grief. Her most recently hemoglobin A1c was >14% on 11/2021    1-3. DM w/o complication type I, uncontrolled (HCC)/hyperglycemia/Insulin dose change  - 30 units of Tresiba  --> discussed setting reminders on phone.  - Novolog 120/30/5 plan   - At bedtime and 2am: 1 unit for every 50 points >150 and 1 unit for every 10 grams of carbs.  - Reviewed meter and CGM download. Discussed trends and patterns.  - Rotate pump sites to prevent scar tissue.  - bolus 15 minutes prior to eating to limit blood sugar spikes.  - Reviewed carb counting and importance of accurate carb counting.  - Discussed signs and symptoms of hypoglycemia. Always have glucose available.  - POCT glucose and hemoglobin A1c  - Reviewed growth chart.    4.  Depression/Anxiety  - Continue follow up with Adolescent med and counseling.  - Discussed impact of depression and anxiety on diabetes care if not controlled.   5. Mixed hyperlipidemia  - Take 2000 units of fish oil daily.  - Low cholesterol diet.   6. Noncompliance.  - Discussed importance of good diabetes care to prevent diabetes related complications.    Follow-up: 4 weeks.    LOS: >45 spent today reviewing the medical chart, counseling the patient/family, and documenting today's visit.   When a patient is on insulin, intensive monitoring of blood glucose levels is necessary to avoid hyperglycemia and hypoglycemia. Severe hyperglycemia/hypoglycemia can lead to hospital admissions and be life threatening.     Gretchen Short,  FNP-C  Pediatric Specialist  [address]  [place], [address]  Tele: [phone]

## 2022-01-09 ENCOUNTER — Other Ambulatory Visit (INDEPENDENT_AMBULATORY_CARE_PROVIDER_SITE_OTHER): Payer: Self-pay | Admitting: Family

## 2022-02-17 ENCOUNTER — Encounter: Payer: Self-pay | Admitting: Pediatrics

## 2022-02-17 ENCOUNTER — Ambulatory Visit: Payer: BC Managed Care – PPO | Admitting: Pediatrics

## 2022-02-17 VITALS — BP 118/79 | HR 95 | Ht 59.45 in | Wt 118.0 lb

## 2022-02-17 DIAGNOSIS — F902 Attention-deficit hyperactivity disorder, combined type: Secondary | ICD-10-CM | POA: Diagnosis not present

## 2022-02-17 DIAGNOSIS — E109 Type 1 diabetes mellitus without complications: Secondary | ICD-10-CM

## 2022-02-17 DIAGNOSIS — F321 Major depressive disorder, single episode, moderate: Secondary | ICD-10-CM | POA: Diagnosis not present

## 2022-02-17 DIAGNOSIS — Z3041 Encounter for surveillance of contraceptive pills: Secondary | ICD-10-CM

## 2022-02-17 DIAGNOSIS — Z113 Encounter for screening for infections with a predominantly sexual mode of transmission: Secondary | ICD-10-CM

## 2022-02-17 DIAGNOSIS — F411 Generalized anxiety disorder: Secondary | ICD-10-CM

## 2022-02-17 DIAGNOSIS — F5001 Anorexia nervosa, restricting type: Secondary | ICD-10-CM | POA: Diagnosis not present

## 2022-02-17 DIAGNOSIS — G4700 Insomnia, unspecified: Secondary | ICD-10-CM

## 2022-02-17 DIAGNOSIS — E1065 Type 1 diabetes mellitus with hyperglycemia: Secondary | ICD-10-CM

## 2022-02-17 DIAGNOSIS — F4323 Adjustment disorder with mixed anxiety and depressed mood: Secondary | ICD-10-CM

## 2022-02-17 MED ORDER — NORGESTIMATE-ETH ESTRADIOL 0.25-35 MG-MCG PO TABS: ORAL_TABLET | ORAL | 4 refills | Status: AC

## 2022-02-17 MED ORDER — DEXCOM G6 TRANSMITTER MISC: 1.0000 "application " | 3 refills | Status: DC | PRN

## 2022-02-17 MED ORDER — PRAZOSIN HCL 2 MG PO CAPS: 2.0000 mg | ORAL_CAPSULE | Freq: Every day | ORAL | 1 refills | Status: AC

## 2022-02-17 MED ORDER — BUPROPION HCL ER (XL) 300 MG PO TB24: 300.0000 mg | ORAL_TABLET | Freq: Every day | ORAL | 1 refills | Status: AC

## 2022-02-17 MED ORDER — ESCITALOPRAM OXALATE 20 MG PO TABS: 20.0000 mg | ORAL_TABLET | Freq: Every day | ORAL | 1 refills | Status: AC

## 2022-02-17 NOTE — Progress Notes (Signed)
History was provided by the patient.  Kelli Davis is a 21 y.o. female who is here for anxiety, depression, grief, nightmares, t1dm.  [name], FNP   HPI:  Pt reports things have been good but she is working a lot because her company is short staffed.  Mood has been ok- not having much depression but is having higher anxiety with more family and friend drama. No random panic attacks- more situational. Taking lexapro, wellbutrin. Not usually missing doses but did miss one and felt bad.    Diabetes stuff has been "not great." Not able to get dexcom and hasn't been able to check sugar as much as she should. She hasn't been doing as well with eating as she should due to comments at work. Still seeing therapist who has been getting on her about this.   Staying with a friend instead of her mom. Insulin went bad due to [facility] out in the house.   Met new partner- having some crampy pain with penetration. Did have a UTI that she reports was treated a few weeks ago. No more burning but wonders if related. Is on period right now which started this morning.      02/17/2022    5:33 PM 08/31/2021   11:58 AM 07/19/2021    2:11 PM  PHQ-SADS Last 3 Score only  PHQ-15 Score _0 Total GAD-7 Score _1 PHQ Adolescent Score _2 No LMP recorded. (Menstrual status: Oral contraceptives).  ROS  Patient Active Problem List   Diagnosis Date Noted   AKI (acute kidney injury) ([place]) 11/24/2021   Anxiety 11/24/2021   Depression 11/24/2021   Iron deficiency anemia due to chronic blood loss 11/24/2021   Menorrhagia 11/24/2021   DKA (diabetic ketoacidosis) ([place]) 07/21/2021   Current moderate episode of major depressive disorder without prior episode ([place]) 05/25/2021   Vitamin D deficiency 05/05/2020   Insulin dose changed ([place]) 09/03/2018   Mixed hyperlipidemia 09/03/2018   Generalized anxiety disorder 09/04/2017   Oral contraceptive pill surveillance 09/04/2017   Eating disorder  05/16/2016   Insomnia 04/12/2016   ADHD (attention deficit hyperactivity disorder), combined type 05/20/2015   Maladaptive health behaviors affecting medical condition 10/17/2013   Hypoglycemia associated with diabetes ([place])    Uncontrolled type 1 diabetes mellitus with hyperglycemia ([place]) 12/13/2010    Current Outpatient Medications on File Prior to Visit  Medication Sig Dispense Refill   ACCU-CHEK FASTCLIX LANCETS MISC Check sugar 10 x daily 300 each 3   buPROPion (WELLBUTRIN XL) 300 MG 24 hr tablet TAKE 1 TABLET BY MOUTH EVERY DAY 90 tablet 1   Continuous Blood Gluc Sensor (DEXCOM G6 SENSOR) MISC USE AS DIRECTED FOR DAILY INJECTION 3 each 3   Continuous Blood Gluc Transmit (DEXCOM G6 TRANSMITTER) MISC 1 application by Does not apply route continuous as needed. 1 each 3   escitalopram (LEXAPRO) 20 MG tablet TAKE 1 TABLET BY MOUTH EVERY DAY 90 tablet 1   fluticasone (FLONASE) 50 MCG/ACT nasal spray Place 1 spray into both nostrils daily as needed for allergies.     insulin aspart (NOVOLOG FLEXPEN) 100 UNIT/ML FlexPen INJECT UP TO 50 UNITS SUBCUTANEOUSLY DAILY (Patient taking differently: 0-15 Units See admin instructions. Three to five times daily with each meal. Per sliding scale) 15 mL 5   insulin degludec (TRESIBA FLEXTOUCH) 100 UNIT/ML FlexTouch Pen Inject 30 Units into the skin at bedtime. Inject up to 50 units per day SubQ (  Patient taking differently: Inject 30 Units into the skin at bedtime.)     Insulin Pen Needle (BD PEN NEEDLE NANO U/F) 32G X 4 MM MISC Use with insulin pens 6x daily 200 each 6   norgestimate-ethinyl estradiol (SPRINTEC 28) 0.25-35 MG-MCG tablet TAKE 1 TABLET DAILY. DISCARD PLACEBOS AND TAKE ACTIVE PILLS FOR CONTINUOUS CYCLING (Patient taking differently: Take 1 tablet by mouth daily.) 112 tablet 4   pantoprazole (PROTONIX) 40 MG tablet Take 1 tablet (40 mg total) by mouth daily. 30 tablet 2   Polysaccharide-Iron Complex 150 MG CAPS Take 1 capsule by mouth daily.  (Patient taking differently: Take 150 mg by mouth daily.) 30 capsule 3   prazosin (MINIPRESS) 2 MG capsule TAKE 1 CAPSULE BY MOUTH AT BEDTIME. (Patient taking differently: Take 2 mg by mouth at bedtime.) 90 capsule 1   Glucagon (BAQSIMI ONE PACK) 3 MG/DOSE POWD Place 1 Dose into the nose as needed. (Patient not taking: Reported on 12/08/2021) 2 each 1   glucose blood test strip Use as instructed (Patient not taking: Reported on 12/08/2021) 300 each 3   No current facility-administered medications on file prior to visit.    Allergies  Allergen Reactions   Penicillins Hives    Did it involve swelling of the face/tongue/throat, SOB, or low BP? Yes Did it involve sudden or severe rash/hives, skin peeling, or any reaction on the inside of your mouth or nose? Yes Did you need to seek medical attention at a hospital or doctor's office? Yes When did it last happen?    today   If all above answers are "NO", may proceed with cephalosporin use.      Physical Exam:    Vitals:   02/17/22 1605  BP: 118/79  Pulse: 95  Weight: 118 lb (53.5 kg)  Height: 4' 11.45" (1.51 m)    Growth %ile SmartLinks can only be used for patients less than 68 years old.  Physical Exam Vitals and nursing note reviewed.  Constitutional:      General: She is not in acute distress.    Appearance: She is well-developed.  Neck:     Thyroid: No thyromegaly.  Cardiovascular:     Rate and Rhythm: Normal rate and regular rhythm.     Heart sounds: No murmur heard. Pulmonary:     Breath sounds: Normal breath sounds.  Abdominal:     Palpations: Abdomen is soft. There is no mass.     Tenderness: There is no abdominal tenderness. There is no guarding.  Musculoskeletal:     Right lower leg: No edema.     Left lower leg: No edema.  Lymphadenopathy:     Cervical: No cervical adenopathy.  Skin:    General: Skin is warm.     Capillary Refill: Capillary refill takes less than 2 seconds.     Findings: No rash.   Neurological:     Mental Status: She is alert.     Comments: No tremor  Psychiatric:        Mood and Affect: Mood and affect normal.     Assessment/Plan: 1. Current moderate episode of major depressive disorder without prior episode ([place]) Will continue lexapro 20 mg daily and wellbutrin. Overall improving.  - escitalopram (LEXAPRO) 20 MG tablet; Take 1 tablet (20 mg total) by mouth daily.  Dispense: 90 tablet; Refill: 1  2. ADHD (attention deficit hyperactivity disorder), combined type Continue wellbutrin.   3. Anorexia nervosa, restricting type Weight is down some associated with poor diabetes care.  Not intentionally restricting. Discussed good nutrition   4. Generalized anxiety disorder As above.   5. Insomnia, unspecified type Continue prazosin  - prazosin (MINIPRESS) 2 MG capsule; Take 1 capsule (2 mg total) by mouth at bedtime.  Dispense: 90 capsule; Refill: 1  7. Type 1 diabetes mellitus without complication ([place]) Transmitters resent- [name] aware. She is still saving for a pump. Due for f/u with endo. Seeing therapist and talking to her about this at length.  - Continuous Blood Gluc Transmit (DEXCOM G6 TRANSMITTER) MISC; 1 application  by Does not apply route continuous as needed.  Dispense: 1 each; Refill: 3  8. Adjustment disorder with mixed anxiety and depressed mood As above.  - buPROPion (WELLBUTRIN XL) 300 MG 24 hr tablet; Take 1 tablet (300 mg total) by mouth daily.  Dispense: 90 tablet; Refill: 1  9. Oral contraceptive pill surveillance Continue ocp. Discussed pain with sex that she is having- will get STI screen today but suspect r/t poor lubrication- provided samples.  - norgestimate-ethinyl estradiol (SPRINTEC 28) 0.25-35 MG-MCG tablet; TAKE 1 TABLET DAILY. DISCARD PLACEBOS AND TAKE ACTIVE PILLS FOR CONTINUOUS CYCLING  Dispense: 112 tablet; Refill: 4  10. Routine screening for STI (sexually transmitted infection) As above.  - C. trachomatis/N. gonorrhoeae  RNA  Will transition to adult PCP. Return PRN prior to then.   Jonathon Resides, FNP

## 2022-02-18 DIAGNOSIS — Z113 Encounter for screening for infections with a predominantly sexual mode of transmission: Secondary | ICD-10-CM | POA: Diagnosis not present

## 2022-02-19 LAB — C. TRACHOMATIS/N. GONORRHOEAE RNA
C. trachomatis RNA, TMA: NOT DETECTED
N. gonorrhoeae RNA, TMA: NOT DETECTED

## 2022-03-08 ENCOUNTER — Other Ambulatory Visit (INDEPENDENT_AMBULATORY_CARE_PROVIDER_SITE_OTHER): Payer: Self-pay | Admitting: Family

## 2022-03-08 DIAGNOSIS — E1065 Type 1 diabetes mellitus with hyperglycemia: Secondary | ICD-10-CM

## 2022-07-26 DIAGNOSIS — H6693 Otitis media, unspecified, bilateral: Secondary | ICD-10-CM | POA: Diagnosis not present

## 2022-07-26 DIAGNOSIS — Z20822 Contact with and (suspected) exposure to covid-19: Secondary | ICD-10-CM | POA: Diagnosis not present

## 2022-07-26 DIAGNOSIS — J029 Acute pharyngitis, unspecified: Secondary | ICD-10-CM | POA: Diagnosis not present

## 2022-08-15 DIAGNOSIS — Z419 Encounter for procedure for purposes other than remedying health state, unspecified: Secondary | ICD-10-CM | POA: Diagnosis not present

## 2022-09-15 DIAGNOSIS — Z419 Encounter for procedure for purposes other than remedying health state, unspecified: Secondary | ICD-10-CM | POA: Diagnosis not present

## 2022-09-27 DIAGNOSIS — B9689 Other specified bacterial agents as the cause of diseases classified elsewhere: Secondary | ICD-10-CM | POA: Diagnosis not present

## 2022-09-27 DIAGNOSIS — J019 Acute sinusitis, unspecified: Secondary | ICD-10-CM | POA: Diagnosis not present

## 2022-10-14 DIAGNOSIS — Z419 Encounter for procedure for purposes other than remedying health state, unspecified: Secondary | ICD-10-CM | POA: Diagnosis not present

## 2022-11-14 DIAGNOSIS — Z419 Encounter for procedure for purposes other than remedying health state, unspecified: Secondary | ICD-10-CM | POA: Diagnosis not present

## 2022-12-08 ENCOUNTER — Telehealth (INDEPENDENT_AMBULATORY_CARE_PROVIDER_SITE_OTHER): Payer: Self-pay | Admitting: Family

## 2022-12-08 DIAGNOSIS — E1065 Type 1 diabetes mellitus with hyperglycemia: Secondary | ICD-10-CM

## 2022-12-08 DIAGNOSIS — E782 Mixed hyperlipidemia: Secondary | ICD-10-CM

## 2022-12-08 NOTE — Telephone Encounter (Signed)
Who's calling (name and relationship to patient) : [name]; self Kelli Davis; dad  Best contact number: [phone]  Provider they see: Dalbert Garnet, NP  Reason for call: Called and Lvm on both lines regarding being refer to an adult Endo. Requested to call back, if there are any questions.   Call ID:      PRESCRIPTION REFILL ONLY  Name of prescription:  Pharmacy:

## 2022-12-09 ENCOUNTER — Encounter (INDEPENDENT_AMBULATORY_CARE_PROVIDER_SITE_OTHER): Payer: Self-pay

## 2022-12-14 DIAGNOSIS — Z419 Encounter for procedure for purposes other than remedying health state, unspecified: Secondary | ICD-10-CM | POA: Diagnosis not present

## 2023-01-14 DIAGNOSIS — Z419 Encounter for procedure for purposes other than remedying health state, unspecified: Secondary | ICD-10-CM | POA: Diagnosis not present

## 2023-02-13 DIAGNOSIS — Z419 Encounter for procedure for purposes other than remedying health state, unspecified: Secondary | ICD-10-CM | POA: Diagnosis not present

## 2023-03-02 ENCOUNTER — Encounter (INDEPENDENT_AMBULATORY_CARE_PROVIDER_SITE_OTHER): Payer: Self-pay

## 2023-03-14 DIAGNOSIS — N898 Other specified noninflammatory disorders of vagina: Secondary | ICD-10-CM | POA: Diagnosis not present

## 2023-03-16 DIAGNOSIS — Z419 Encounter for procedure for purposes other than remedying health state, unspecified: Secondary | ICD-10-CM | POA: Diagnosis not present

## 2023-04-16 DIAGNOSIS — Z419 Encounter for procedure for purposes other than remedying health state, unspecified: Secondary | ICD-10-CM | POA: Diagnosis not present

## 2023-04-20 ENCOUNTER — Ambulatory Visit: Payer: Medicaid Other | Admitting: Endocrinology

## 2023-04-20 ENCOUNTER — Encounter: Payer: Self-pay | Admitting: Endocrinology

## 2023-04-20 VITALS — BP 115/80 | HR 120 | Ht 59.0 in | Wt 117.6 lb

## 2023-04-20 DIAGNOSIS — E1065 Type 1 diabetes mellitus with hyperglycemia: Secondary | ICD-10-CM

## 2023-04-20 DIAGNOSIS — R739 Hyperglycemia, unspecified: Secondary | ICD-10-CM

## 2023-04-20 LAB — POCT GLYCOSYLATED HEMOGLOBIN (HGB A1C): Hemoglobin A1C: 13.7 % — AB (ref 4.0–5.6)

## 2023-04-20 MED ORDER — DEXCOM G7 SENSOR MISC: 1.0000 | 3 refills | Status: DC

## 2023-04-20 MED ORDER — NOVOLOG FLEXPEN 100 UNIT/ML ~~LOC~~ SOPN: PEN_INJECTOR | SUBCUTANEOUS | 3 refills | Status: DC

## 2023-04-20 MED ORDER — BD PEN NEEDLE NANO U/F 32G X 4 MM MISC: 6 refills | Status: AC

## 2023-04-20 MED ORDER — TRESIBA FLEXTOUCH 100 UNIT/ML ~~LOC~~ SOPN: 32.0000 [IU] | PEN_INJECTOR | Freq: Every day | SUBCUTANEOUS | 4 refills | Status: DC

## 2023-04-20 NOTE — Progress Notes (Unsigned)
Outpatient Endocrinology Note [place] Prudencio Velazco, MD  04/20/23  Patient's Name: Kelli Davis    DOB: [birth year]    MRN: [id number]                                                    REASON OF VISIT: New consult / Follow up *** for evaluation of type *** diabetes mellitus  REFERRING PROVIDER:   PCP: No primary care provider on file.  HISTORY OF PRESENT ILLNESS:   Kelli Davis is a 22 y.o. old female with past medical history listed below, is here for evaluation / follow up *** type *** diabetes mellitus.   Pertinent Diabetes History: _Diagnosed as type *** at the age of *** years / in 6 years.  She is to be normal report from the age of 88-16.  She was following with pediatric endocrinology Barron Alvine, NP and referred to establish care with adult endocrinology.  She was given Dexcom G6.    Prior therapy: Initially managed with *** and insulin started in ***  History of DKA or diabetes related hospitalizations: at the onset. Several times of DKA. Last DAK 2 years ago.   Dr. Dalbert Garnet  Previous diabetes education: Yes ***  Family h/o diabetes mellitus: ***   Chronic Diabetes Complications : Retinopathy: no. Last ophthalmology exam was done on Due Nephropathy: no, on *** Peripheral neuropathy: no, on *** Coronary artery disease: no Stroke: no  Relevant comorbidities and cardiovascular risk factors: Obesity: *** There is no height or weight on file to calculate BMI.  Hypertension: *** Hyperlipidemia. ***  Current / Home Diabetic regimen includes: Tresiba 32 units daily in the morning. Novolog per carb ratio 1:5 ratio, total 25 units a day.   Prior diabetic medications:  Glycemic data:    CONTINUOUS GLUCOSE MONITORING SYSTEM (CGMS) INTERPRETATION: At today's visit, we reviewed CGM downloads. The full report is scanned in the media. Reviewing the CGM trends, blood glucose are as follows:  Dexcom G7 CGM-  Sensor Download (Sensor download was reviewed and summarized  below.) Dates: ***  Glucose Management Indicator: ***% Sensor Average: *** SD ***  Glycemic Trends:  <54: ***% 54-70: ***% 71-180: ***% 181-250: ***% 251-400: ***%  Interpretation: - ***  FreeStyle Libre CGM-  Sensor Download (Sensor download was reviewed and summarized below.) Dates: *** Sensor Average: ***  Glucose Management Indicator: ***% Glucose Variability: ***%  % data captured: ***  Glycemic Trends:  <54: ***% 54-70: ***% 71-180: ***% 181-250: ***% 251-400: ***%   Interpretation: - ***  Kelli Davis's glucose meter is computer downloaded. Raw data and trends analyzed.   -  She has been testing her blood glucoses *** times daily.  -  Average glucose for the last *** days is *** mg/dl, range *** - ***. -  Trends noted: ***   She has been checking blood sugar occasionally some of the blood sugar reviewed from glucometer download 105, 245, 338, 125, 285, 117, more than 400.  From August 10 to April 07, 2023  Hypoglycemia: Patient has *** hypoglycemic episodes. Patient has hypoglycemia awareness, has a glucagon ER kit and diabetic alert device ***.   Factors modifying glucose control: 1.  Diabetic diet assessment: ***  2.  Staying active or exercising: ***  3.  Medication compliance: compliant *** of the time.  Interval history 04/20/23 ***  REVIEW OF  SYSTEMS As per history of present illness.   PAST MEDICAL HISTORY: Past Medical History:  Diagnosis Date   ADD (attention deficit disorder)    Eating disorder    Febrile seizure (HCC)    History of eye surgery    Physical growth delay    Type 1 diabetes mellitus not at goal [facility])     PAST SURGICAL HISTORY: Past Surgical History:  Procedure Laterality Date   EYE MUSCLE SURGERY      ALLERGIES: Allergies  Allergen Reactions   Penicillins Hives    Did it involve swelling of the face/tongue/throat, SOB, or low BP? Yes Did it involve sudden or severe rash/hives, skin peeling, or any reaction on  the inside of your mouth or nose? Yes Did you need to seek medical attention at a hospital or doctor's office? Yes When did it last happen?    today   If all above answers are "NO", may proceed with cephalosporin use.      FAMILY HISTORY:  Family History  Problem Relation Age of Onset   Cancer Maternal Grandmother    Hypertension Maternal Grandfather    Depression Maternal Grandfather    Diabetes Paternal Grandfather    Anxiety disorder Father    Depression Mother    Anxiety disorder Sister     SOCIAL HISTORY: Social History   Socioeconomic History   Marital status: Single    Spouse name: Not on file   Number of children: Not on file   Years of education: Not on file   Highest education level: Not on file  Occupational History   Occupation: Event organiser  Tobacco Use   Smoking status: Never    Passive exposure: Yes   Smokeless tobacco: Never   Tobacco comments:    Pt reports only once  Substance and Sexual Activity   Alcohol use: Yes    Comment: Pt reports only once or twice   Drug use: No   Sexual activity: Yes    Birth control/protection: Condom    Comment: Parents are not aware pt. has been sexually active with BF.  Other Topics Concern   Not on file  Social History Narrative   The child's parents are divorced. Father has remarried. Stepmother has type 1 diabetes mellitus and is on an insulin pump.Spends time in both her mother and father's homes. Parents very cooperative about diabetes care.    Works at [employer]: Not on [employer] Insecurity: Not on file  Transportation Needs: Not on file  Physical Activity: Not on file  Stress: Not on file  Social Connections: Unknown (09/27/2022)   [facility]   Social Network    Social Network: Not on file    MEDICATIONS:  Current Outpatient Medications  Medication Sig Dispense Refill   ACCU-CHEK FASTCLIX LANCETS  MISC Check sugar 10 x daily 300 each 3   buPROPion (WELLBUTRIN XL) 300 MG 24 hr tablet Take 1 tablet (300 mg total) by mouth daily. 90 tablet 1   Continuous Blood Gluc Sensor (DEXCOM G6 SENSOR) MISC USE AS DIRECTED FOR DAILY INJECTION 3 each 3   Continuous Blood Gluc Transmit (DEXCOM G6 TRANSMITTER) MISC 1 application  by Does not apply route continuous as needed. 1 each 3   escitalopram (LEXAPRO) 20 MG tablet Take 1 tablet (20 mg total) by mouth daily. 90 tablet 1   fluticasone (FLONASE) 50 MCG/ACT nasal spray Place 1 spray into  both nostrils daily as needed for allergies.     Glucagon (BAQSIMI ONE PACK) 3 MG/DOSE POWD Place 1 Dose into the nose as needed. (Patient not taking: Reported on 12/08/2021) 2 each 1   glucose blood test strip Use as instructed (Patient not taking: Reported on 12/08/2021) 300 each 3   insulin degludec (TRESIBA FLEXTOUCH) 100 UNIT/ML FlexTouch Pen Inject 30 Units into the skin at bedtime. Inject up to 50 units per day SubQ (Patient taking differently: Inject 30 Units into the skin at bedtime.)     Insulin Pen Needle (BD PEN NEEDLE NANO U/F) 32G X 4 MM MISC Use with insulin pens 6x daily 200 each 6   norgestimate-ethinyl estradiol (SPRINTEC 28) 0.25-35 MG-MCG tablet TAKE 1 TABLET DAILY. DISCARD PLACEBOS AND TAKE ACTIVE PILLS FOR CONTINUOUS CYCLING 112 tablet 4   NOVOLOG FLEXPEN 100 UNIT/ML FlexPen INJECT UP TO 50 UNITS SUBCUTANEOUSLY DAILY 15 mL 3   prazosin (MINIPRESS) 2 MG capsule Take 1 capsule (2 mg total) by mouth at bedtime. 90 capsule 1   No current facility-administered medications for this visit.    PHYSICAL EXAM: There were no vitals filed for this visit. There is no height or weight on file to calculate BMI.  Wt Readings from Last 3 Encounters:  02/17/22 118 lb (53.5 kg)  12/08/21 121 lb 12.8 oz (55.2 kg)  12/02/21 122 lb 9.6 oz (55.6 kg)    General: Well developed, well nourished female in no apparent distress.  HEENT: AT/[place], no external lesions.  Eyes:  Conjunctiva clear and no icterus. Neck: Neck supple  Lungs: Respirations not labored Neurologic: Alert, oriented, normal speech Extremities / Skin: Dry. No sores or rashes noted. No acanthosis nigricans Psychiatric: Does not appear depressed or anxious  Diabetic Foot Exam: Monofilament sensory exam intact / decreased b/l, no callus, no ulceration, Dorsalis Pedis 2+ b/l  Diabetic Foot Exam - Simple   No data filed     Diabetic Foot Examination {AMB DIABETIC FOOT UJWJ:19147}  LABS Reviewed Lab Results  Component Value Date   HGBA1C 14.0 (H) 11/23/2021   HGBA1C 14.6 (H) 11/22/2021   HGBA1C 11.5 (A) 08/19/2021   No results found for: "FRUCTOSAMINE" Lab Results  Component Value Date   CHOL 329 (H) 05/18/2021   HDL 138 05/18/2021   LDLCALC 155 (H) 05/18/2021   TRIG 196 (H) 05/18/2021   CHOLHDL 2.4 05/18/2021   Lab Results  Component Value Date   MICRALBCREAT 15 10/07/2019   MICRALBCREAT 7 07/16/2018   Lab Results  Component Value Date   CREATININE 0.54 11/25/2021   No results found for: "GFR"  ASSESSMENT / PLAN  No diagnosis found.  Diabetes Mellitus type ***, complicated by *** - Diabetic status / severity: ***  Lab Results  Component Value Date   HGBA1C 14.0 (H) 11/23/2021    - Hemoglobin A1c goal : <***%  - Medications: ***  I) II) III) IV)  - Home glucose testing: *** - Discussed/ Gave Hypoglycemia treatment plan.  # Consult : not required at this time. ***  # Annual urine for microalbuminuria/ creatinine ratio, no microalbuminuria currently, continue ACE/ARB *** Last  Lab Results  Component Value Date   MICRALBCREAT 15 10/07/2019    # Foot check nightly / neuropathy, continue ***  # Annual dilated diabetic eye exams.   - Diet: {dmclinicdiabeticdiettype:42746::"Make healthy diabetic food choices","Eat reasonable portion sizes to promote a healthy weight"} - Life style / activity / exercise: {dmclinicactivity:42747}  2. Blood pressure   -  BP  Readings from Last 1 Encounters:  02/17/22 118/79    - Control {IS/IS NOT:9024::"is"} in target.  - {endo rb hypertension recommendations:35631::"No change in current plans."}  3. Lipid status / Hyperlipidemia - Last  Lab Results  Component Value Date   LDLCALC 155 (H) 05/18/2021   - Continue ***   There are no diagnoses linked to this encounter.  DISPOSITION Follow up in clinic in   months suggested.   All questions answered and patient verbalized understanding of the plan.  [place] Iverna Hammac, MD [facility] Endocrinology [facility] [address] [place], [address] [place], [place] [address] Phone # [phone]  At least part of this note was generated using voice recognition software. Inadvertent word errors may have occurred, which were not recognized during the proofreading process.

## 2023-04-20 NOTE — Patient Instructions (Signed)
Diabetes regimen: Tresiba 32 units daily. Novolog as per carb ratio 1:5 with meals three times a day.  DEXCOM G7 prescribed.  If your blood sugar low, call our clinic, may need to decrease insulin dose.   Refer to diabetes education for pump initiation training.

## 2023-04-21 ENCOUNTER — Encounter: Payer: Self-pay | Admitting: Endocrinology

## 2023-04-21 LAB — BASIC METABOLIC PANEL
BUN: 9 mg/dL (ref 6–23)
CO2: 28 meq/L (ref 19–32)
Calcium: 9.6 mg/dL (ref 8.4–10.5)
Chloride: 98 meq/L (ref 96–112)
Creatinine, Ser: 0.66 mg/dL (ref 0.40–1.20)
GFR: 124.24 mL/min (ref >60.00)
Glucose, Bld: 259 mg/dL — ABNORMAL HIGH (ref 70–99)
Potassium: 3.9 meq/L (ref 3.5–5.1)
Sodium: 136 mEq/L (ref 135–145)

## 2023-04-22 ENCOUNTER — Encounter: Payer: Self-pay | Admitting: Endocrinology

## 2023-04-24 ENCOUNTER — Telehealth: Payer: Self-pay | Admitting: Nutrition

## 2023-04-24 NOTE — Telephone Encounter (Signed)
LVM that we have a sample that we can give her to tide her over until the prior auth has been submitted

## 2023-04-25 ENCOUNTER — Telehealth: Payer: Self-pay

## 2023-04-25 ENCOUNTER — Other Ambulatory Visit (HOSPITAL_COMMUNITY): Payer: Self-pay

## 2023-04-25 NOTE — Telephone Encounter (Signed)
Pharmacy Patient Advocate Encounter   Received notification from Pt Calls Messages that prior authorization for Dexcom G7 sensor is required/requested.   Insurance verification completed.   The patient is insured through [facility] .   Per test claim: PA required; PA submitted to [facility] via CoverMyMeds Key/confirmation #/EOC [id number] Status is pending

## 2023-04-25 NOTE — Telephone Encounter (Signed)
PA needed for Dexcom

## 2023-04-28 ENCOUNTER — Encounter: Payer: Self-pay | Admitting: Endocrinology

## 2023-05-01 ENCOUNTER — Encounter: Payer: Medicaid Other | Attending: Endocrinology | Admitting: Nutrition

## 2023-05-01 ENCOUNTER — Other Ambulatory Visit: Payer: Self-pay

## 2023-05-01 ENCOUNTER — Telehealth: Payer: Self-pay | Admitting: Nutrition

## 2023-05-01 DIAGNOSIS — E109 Type 1 diabetes mellitus without complications: Secondary | ICD-10-CM

## 2023-05-01 DIAGNOSIS — E1065 Type 1 diabetes mellitus with hyperglycemia: Secondary | ICD-10-CM | POA: Insufficient documentation

## 2023-05-01 DIAGNOSIS — Z713 Dietary counseling and surveillance: Secondary | ICD-10-CM | POA: Insufficient documentation

## 2023-05-01 DIAGNOSIS — E1165 Type 2 diabetes mellitus with hyperglycemia: Secondary | ICD-10-CM

## 2023-05-01 MED ORDER — OMNIPOD 5 G7 INTRO (GEN 5) KIT: 1.0000 | PACK | Freq: Once | 0 refills | Status: AC

## 2023-05-01 MED ORDER — DEXCOM G6 TRANSMITTER MISC: 1.0000 "application " | 3 refills | Status: AC | PRN

## 2023-05-01 NOTE — Telephone Encounter (Signed)
Omnipod 5 has been ordered through [facility]

## 2023-05-01 NOTE — Telephone Encounter (Signed)
Pharmacy Patient Advocate Encounter  Received notification from [facility] that Prior Authorization for Dexcom G7 sensor has been APPROVED through 10/22/2023   PA #/Case ID/Reference #: [id number]

## 2023-05-01 NOTE — Patient Instructions (Signed)
Call [facility] to set up an account in 3 days. Call Dexcom help line if questions about app or sensor application Call Bonita Quin when pump comes in for training: [phone]

## 2023-05-01 NOTE — Progress Notes (Signed)
Patient reports that she has several G6 sensors at home, but no transmitter.  She also has G7 sensors at home.  She wants to use the G6 sensor up  before starting the G7, and says she needs transmitters for both sensors. She was given a G6 transmitter and told the G7 sensor need no transmitter.  She was show how to use the G7 transmitter and add the code found on the sensor case.  She reported good understanding of this.  Her apps were not able to be opened.  Dexcom customer care called and new user name and passwords set up for both apps as well as clarity app.  The clarity app was linked to [place] endo. She is wanting to start back on an insulin pump.  She is wanting the OmniPod 5.  I explained how this works and she has agreed that this is the pump for her.  Message sent to Dr. Wadie Lessen CMA to order through [facility] pharmacy to be able to use both the G6 and G7 sensors that she has at home.  She was given [facility] number to call if she does not hear from them in 3 days.  She was told to call me when the pump comes in and we will schedule training.  She agreed to do this and had no final questions

## 2023-05-01 NOTE — Telephone Encounter (Signed)
Saw patient today and she is wanting the Omnipod5.  Please order this through [facility] pharmacy because she is using the G7 sensors.  Thank you

## 2023-05-11 ENCOUNTER — Other Ambulatory Visit: Payer: Self-pay

## 2023-05-11 ENCOUNTER — Encounter: Payer: Self-pay | Admitting: Endocrinology

## 2023-05-11 ENCOUNTER — Other Ambulatory Visit: Payer: Self-pay | Admitting: Endocrinology

## 2023-05-11 DIAGNOSIS — Z794 Long term (current) use of insulin: Secondary | ICD-10-CM

## 2023-05-11 MED ORDER — TRESIBA FLEXTOUCH 100 UNIT/ML ~~LOC~~ SOPN: 32.0000 [IU] | PEN_INJECTOR | Freq: Every day | SUBCUTANEOUS | Status: DC

## 2023-05-11 MED ORDER — TRESIBA FLEXTOUCH 100 UNIT/ML ~~LOC~~ SOPN: 32.0000 [IU] | PEN_INJECTOR | Freq: Every day | SUBCUTANEOUS | 0 refills | Status: DC

## 2023-05-12 ENCOUNTER — Telehealth: Payer: Self-pay | Admitting: Dietician

## 2023-05-12 ENCOUNTER — Telehealth: Payer: Self-pay | Admitting: Pharmacy Technician

## 2023-05-12 ENCOUNTER — Telehealth: Payer: Self-pay

## 2023-05-12 ENCOUNTER — Other Ambulatory Visit (HOSPITAL_COMMUNITY): Payer: Self-pay

## 2023-05-12 MED ORDER — INSULIN ASPART 100 UNIT/ML IJ SOLN: INTRAMUSCULAR | 1 refills | Status: DC

## 2023-05-12 NOTE — Telephone Encounter (Signed)
Original Order: insulin degludec (TRESIBA FLEXTOUCH) 100 UNIT/ML FlexTouch Pen [[id number]]   Is requiring a Prior Auth  , Thanks

## 2023-05-12 NOTE — Telephone Encounter (Signed)
Patient now has the Omnipod 5 and needs training.  She is trying to use the Dexcom G6 up but has the Dexcom G7 to her training appointment along with a vial of Novolog which has been requested from the medical assistant.  Asked that she not take her long acting insulin the morning of training.  Appointment made with Bonita Quin for 05/22/2023. Pump order request placed given to Dr. Erroll Luna.  Oran Rein, RD, LDN, CDCES

## 2023-05-12 NOTE — Telephone Encounter (Signed)
done

## 2023-05-12 NOTE — Telephone Encounter (Signed)
Pharmacy Patient Advocate Encounter   Received notification from Pt Calls Messages that prior authorization for Evaristo Bury is required/requested.   Insurance verification completed.   The patient is insured through [facility] [place] .   Per test claim:  Lantus or Levemir is preferred by the insurance.  If suggested medication is appropriate, Please send in a new RX and discontinue this one. If not, please advise as to why it's not appropriate so that we may request a Prior Authorization. Both of these would be $4. However, I do see in notes, back in 2022 it was noted that pt had an allergic reaction to Levemir, but it's not noted in her allergies & I'm not sure what the reaction was.

## 2023-05-16 DIAGNOSIS — Z419 Encounter for procedure for purposes other than remedying health state, unspecified: Secondary | ICD-10-CM | POA: Diagnosis not present

## 2023-05-17 NOTE — Telephone Encounter (Signed)
Patient picked up Tresiba sample 

## 2023-05-18 ENCOUNTER — Other Ambulatory Visit: Payer: Self-pay | Admitting: Endocrinology

## 2023-05-18 ENCOUNTER — Other Ambulatory Visit: Payer: Self-pay

## 2023-05-18 DIAGNOSIS — E1165 Type 2 diabetes mellitus with hyperglycemia: Secondary | ICD-10-CM

## 2023-05-18 MED ORDER — LANTUS SOLOSTAR 100 UNIT/ML ~~LOC~~ SOPN: 32.0000 [IU] | PEN_INJECTOR | Freq: Every day | SUBCUTANEOUS | 4 refills | Status: DC

## 2023-05-18 NOTE — Progress Notes (Unsigned)
Okay to change to Lantus 32 units daily.  I placed the order.

## 2023-05-18 NOTE — Telephone Encounter (Signed)
Lantus was sent to [facility]

## 2023-05-22 ENCOUNTER — Encounter: Payer: Medicaid Other | Attending: Endocrinology | Admitting: Nutrition

## 2023-05-22 DIAGNOSIS — E1065 Type 1 diabetes mellitus with hyperglycemia: Secondary | ICD-10-CM | POA: Insufficient documentation

## 2023-05-22 NOTE — Progress Notes (Unsigned)
Patient was identified by name and DOB.  She is here today to start [name] 5 insulin pump.  She did not take her [place] last night.  She is currently wearing the Dexcom G6.  She was taking a total of about 61u/day of injections.  Her pump settings were put into the PDM by the patient:  Basal rate: 1.0u/hr, ISF: 30, target: 120 with correction over 150 (this was set by the patient.  She was encouraged to reduce this to 110, with correction over 120, but did not want to do this at this time.)  I/C: 5, timing: 4 hours,  Max bolus: 25u, Max basal: 2.0u/hr. We reviewed how this pump delivers insulin in the manual and automode.  She was shown how to bolus and how and when to do correction doses.   Her Dexcom G6 was linked to the PDM and the PDM was linked to OmniPod and Glooko.  User name: [email or web address],  PW: [email or web address]   She was shown how/when to do meal boluses and correction boluses.  She re demonstrated how to do this correctly.  We discussed IOB, what this means and how to use this to determine low blood sugar treatments as well as how it is used in bolus calculations.  She reported good understanding of this. I will call her tomorrow as well as in three days.  We reviewed the pump training checklist and she signed off as understanding all topics with no final questions.

## 2023-05-22 NOTE — Patient Instructions (Addendum)
Stop Tresiba insulin Change pod every 3 days Do meal time boluses for all meals and snacks unless it is used to treat a low blood sugar.   Read over pump starter booklet and pump manual on line. Read over handouts in pump packet given on low/high blood sugar protocols, sick day guidelines, etc.  Call OmniPod if questions about how to use your pump. Call Dexcom if questions/problems with sensor. Make an appointment with Dr. Erroll Luna in 4 weeks.

## 2023-05-23 ENCOUNTER — Telehealth: Payer: Self-pay | Admitting: Nutrition

## 2023-05-23 NOTE — Telephone Encounter (Signed)
Patient reports no difficulty giving boluses last night or today.  Says FBS was 98 this AM and "all the readings have been really good".  Says one low today after breakfast, but believe she did not count carbs correctly.   Says is bolusing before all meals and adding blood sugar readings to every bolus PDM download show pump 77% in target range with no lows below 80.

## 2023-05-23 NOTE — Telephone Encounter (Signed)
LVM to call me to let me know how she is doing on this pump.

## 2023-05-30 ENCOUNTER — Telehealth: Payer: Self-pay | Admitting: Nutrition

## 2023-05-30 NOTE — Telephone Encounter (Signed)
Patient reports that she is having no difficulty filling, wearing or using the OmniPod.  Says her blood sugars are so much better and she is feeling so much better.  One pod was leaking insulin after inserting it, and her blood sugars rent high.  She inserted a new pod and blood sugars came down.  FBSs are in the low 120s.  Glooko shows patient is in range 80% of time in last 6 days.  She had no questions for me at this time and did not want to come back for a review.

## 2023-05-31 ENCOUNTER — Ambulatory Visit: Payer: Medicaid Other | Admitting: Endocrinology

## 2023-06-08 ENCOUNTER — Other Ambulatory Visit: Payer: Self-pay | Admitting: Endocrinology

## 2023-06-08 DIAGNOSIS — L282 Other prurigo: Secondary | ICD-10-CM | POA: Diagnosis not present

## 2023-06-08 DIAGNOSIS — E11649 Type 2 diabetes mellitus with hypoglycemia without coma: Secondary | ICD-10-CM

## 2023-06-08 DIAGNOSIS — R252 Cramp and spasm: Secondary | ICD-10-CM | POA: Diagnosis not present

## 2023-06-09 DIAGNOSIS — R252 Cramp and spasm: Secondary | ICD-10-CM | POA: Diagnosis not present

## 2023-06-09 DIAGNOSIS — R059 Cough, unspecified: Secondary | ICD-10-CM | POA: Diagnosis not present

## 2023-06-09 DIAGNOSIS — Z20822 Contact with and (suspected) exposure to covid-19: Secondary | ICD-10-CM | POA: Diagnosis not present

## 2023-06-16 DIAGNOSIS — Z419 Encounter for procedure for purposes other than remedying health state, unspecified: Secondary | ICD-10-CM | POA: Diagnosis not present

## 2023-06-20 DIAGNOSIS — M6281 Muscle weakness (generalized): Secondary | ICD-10-CM | POA: Diagnosis not present

## 2023-06-20 DIAGNOSIS — R21 Rash and other nonspecific skin eruption: Secondary | ICD-10-CM | POA: Diagnosis not present

## 2023-06-20 DIAGNOSIS — M791 Myalgia, unspecified site: Secondary | ICD-10-CM | POA: Diagnosis not present

## 2023-06-23 DIAGNOSIS — M791 Myalgia, unspecified site: Secondary | ICD-10-CM | POA: Diagnosis not present

## 2023-06-23 DIAGNOSIS — R21 Rash and other nonspecific skin eruption: Secondary | ICD-10-CM | POA: Diagnosis not present

## 2023-06-28 ENCOUNTER — Other Ambulatory Visit: Payer: Self-pay

## 2023-06-28 ENCOUNTER — Ambulatory Visit: Payer: Medicaid Other | Admitting: Endocrinology

## 2023-06-28 ENCOUNTER — Encounter (HOSPITAL_BASED_OUTPATIENT_CLINIC_OR_DEPARTMENT_OTHER): Payer: Self-pay | Admitting: Emergency Medicine

## 2023-06-28 DIAGNOSIS — Z7984 Long term (current) use of oral hypoglycemic drugs: Secondary | ICD-10-CM | POA: Insufficient documentation

## 2023-06-28 DIAGNOSIS — Z794 Long term (current) use of insulin: Secondary | ICD-10-CM | POA: Diagnosis not present

## 2023-06-28 DIAGNOSIS — R21 Rash and other nonspecific skin eruption: Secondary | ICD-10-CM | POA: Diagnosis not present

## 2023-06-28 DIAGNOSIS — E119 Type 2 diabetes mellitus without complications: Secondary | ICD-10-CM | POA: Diagnosis not present

## 2023-06-28 LAB — CBC WITH DIFFERENTIAL/PLATELET
Abs Immature Granulocytes: 0.02 10*3/uL (ref 0.00–0.07)
Basophils Absolute: 0 10*3/uL (ref 0.0–0.1)
Basophils Relative: 0 %
Eosinophils Absolute: 0.1 10*3/uL (ref 0.0–0.5)
Eosinophils Relative: 1 %
HCT: 41.8 % (ref 36.0–46.0)
Hemoglobin: 15.1 g/dL — ABNORMAL HIGH (ref 12.0–15.0)
Immature Granulocytes: 0 %
Lymphocytes Relative: 36 %
Lymphs Abs: 3 10*3/uL (ref 0.7–4.0)
MCH: 31.1 pg (ref 26.0–34.0)
MCHC: 36.1 g/dL — ABNORMAL HIGH (ref 30.0–36.0)
MCV: 86 fL (ref 80.0–100.0)
Monocytes Absolute: 0.6 10*3/uL (ref 0.1–1.0)
Monocytes Relative: 8 %
Neutro Abs: 4.5 10*3/uL (ref 1.7–7.7)
Neutrophils Relative %: 55 %
Platelets: 335 10*3/uL (ref 150–400)
RBC: 4.86 MIL/uL (ref 3.87–5.11)
RDW: 11.3 % — ABNORMAL LOW (ref 11.5–15.5)
WBC: 8.2 10*3/uL (ref 4.0–10.5)
nRBC: 0 % (ref 0.0–0.2)

## 2023-06-28 LAB — URINALYSIS, ROUTINE W REFLEX MICROSCOPIC
Bilirubin Urine: NEGATIVE
Glucose, UA: 100 mg/dL — AB
Hgb urine dipstick: NEGATIVE
Ketones, ur: NEGATIVE mg/dL
Nitrite: NEGATIVE
Protein, ur: NEGATIVE mg/dL
Specific Gravity, Urine: >=1.03 (ref 1.005–1.030)
pH: 6 (ref 5.0–8.0)

## 2023-06-28 LAB — PREGNANCY, URINE: Preg Test, Ur: NEGATIVE

## 2023-06-28 LAB — URINALYSIS, MICROSCOPIC (REFLEX)

## 2023-06-28 NOTE — ED Triage Notes (Signed)
Pt states muscle cramping, pustule rash all over, nipple bleeding with burning sensation on back of arms and legs.  X 3-4 weeks as been seen for same a couple times and has been given different Rx, states rash went away and came back.

## 2023-06-29 ENCOUNTER — Emergency Department (HOSPITAL_BASED_OUTPATIENT_CLINIC_OR_DEPARTMENT_OTHER)
Admission: EM | Admit: 2023-06-29 | Discharge: 2023-06-29 | Disposition: A | Discharge status: Home / Self Care | Payer: Medicaid Other | Attending: Emergency Medicine | Admitting: Emergency Medicine

## 2023-06-29 DIAGNOSIS — R21 Rash and other nonspecific skin eruption: Secondary | ICD-10-CM

## 2023-06-29 LAB — COMPREHENSIVE METABOLIC PANEL
ALT: 11 U/L (ref 0–44)
AST: 15 U/L (ref 15–41)
Albumin: 4.1 g/dL (ref 3.5–5.0)
Alkaline Phosphatase: 56 U/L (ref 38–126)
Anion gap: 9 (ref 5–15)
BUN: 11 mg/dL (ref 6–20)
CO2: 24 mmol/L (ref 22–32)
Calcium: 9.1 mg/dL (ref 8.9–10.3)
Chloride: 103 mmol/L (ref 98–111)
Creatinine, Ser: 0.62 mg/dL (ref 0.44–1.00)
GFR, Estimated: >60 mL/min (ref >60)
Glucose, Bld: 83 mg/dL (ref 70–99)
Potassium: 3.6 mmol/L (ref 3.5–5.1)
Sodium: 136 mmol/L (ref 135–145)
Total Bilirubin: 0.8 mg/dL (ref <1.2)
Total Protein: 7.4 g/dL (ref 6.5–8.1)

## 2023-06-29 NOTE — ED Provider Notes (Signed)
[place] EMERGENCY DEPARTMENT AT [facility] Provider Note   CSN: [id number] Arrival date & time: 06/28/23  2237     History  Chief Complaint  Patient presents with   Rash    Kelli Davis is a 22 y.o. female.  The history is provided by the patient and a significant other.   Patient with history of diabetes presents with multiple complaints.  Patient reports for over a month she has had a rash throughout most of her body.  She also reports cramping in her extremities.  She has been seen for this previously has been given multiple medications.  She reports the rash is ongoing She reports tonight while she was in the shower the rash appeared to be worsening.  She also reports the rash around her nipples are bleeding. Patient has an insulin pump in place and has been able to control her glucose    Home Medications Prior to Admission medications   Medication Sig Start Date End Date Taking? Authorizing Provider  ACCU-CHEK FASTCLIX LANCETS MISC Check sugar 10 x daily 10/21/15   Gretchen Short, NP  buPROPion (WELLBUTRIN XL) 300 MG 24 hr tablet Take 1 tablet (300 mg total) by mouth daily. 02/17/22   [name], FNP  Continuous Glucose Sensor (DEXCOM G7 SENSOR) MISC 1 Device by Does not apply route continuous. 04/20/23   Thapa, [place], MD  Continuous Glucose Transmitter (DEXCOM G6 TRANSMITTER) MISC 1 application  by Does not apply route continuous as needed. 05/01/23   Thapa, [place], MD  escitalopram (LEXAPRO) 20 MG tablet Take 1 tablet (20 mg total) by mouth daily. 02/17/22   [name], FNP  fluticasone (FLONASE) 50 MCG/ACT nasal spray Place 1 spray into both nostrils daily as needed for allergies. 02/06/19   [provider]  Glucagon (BAQSIMI ONE PACK) 3 MG/DOSE POWD Place 1 Dose into the nose as needed. 10/07/19   Gretchen Short, NP  glucose blood test strip Use as instructed 05/13/21   Gretchen Short, NP  insulin aspart (NOVOLOG FLEXPEN) 100 UNIT/ML  FlexPen As per carb count 1:5 INJECT UP TO 50 UNITS SUBCUTANEOUSLY DAILY 04/20/23   Thapa, [place], MD  insulin aspart (NOVOLOG) 100 UNIT/ML injection 50 units max daily dose per pump 05/12/23   Thapa, [place], MD  Insulin Degludec FlexTouch 100 UNIT/ML SOPN INJECT 32 UNITS INTO THE SKIN DAILY. 05/12/23   Thapa, [place], MD  insulin glargine (LANTUS SOLOSTAR) 100 UNIT/ML Solostar Pen Inject 32 Units into the skin daily. 05/18/23   Thapa, [place], MD  Insulin Pen Needle (BD PEN NEEDLE NANO U/F) 32G X 4 MM MISC Use with insulin pens 6x daily 04/20/23   Thapa, [place], MD  norgestimate-ethinyl estradiol (SPRINTEC 28) 0.25-35 MG-MCG tablet TAKE 1 TABLET DAILY. DISCARD PLACEBOS AND TAKE ACTIVE PILLS FOR CONTINUOUS CYCLING 02/17/22   Alfonso Ramus T, FNP  prazosin (MINIPRESS) 2 MG capsule Take 1 capsule (2 mg total) by mouth at bedtime. 02/17/22   [name], FNP      Allergies    Penicillins    Review of Systems   Review of Systems  Constitutional:  Negative for fever.  Gastrointestinal:  Negative for vomiting.  Musculoskeletal:  Positive for myalgias.  Skin:  Positive for rash.    Physical Exam Updated Vital Signs BP (!) 133/91 (BP Location: Right Arm)   Pulse 88   Temp 98.6 F (37 C) (Oral)   Resp 16   Ht 1.499 m (4\' 11" )   Wt 54.4 kg  LMP 06/10/2023   SpO2 99%   BMI 24.24 kg/m  Physical Exam CONSTITUTIONAL: Well developed/well nourished, resting comfortably, no acute distress HEAD: Normocephalic/atraumatic ENMT: Mucous membranes moist no angioedema, no stridor, no oral lesions noted, no erythema NECK: supple no meningeal signs CV: S1/S2 noted, no murmurs/rubs/gallops noted LUNGS: Lungs are clear to auscultation bilaterally, no apparent distress ABDOMEN: soft, nontender NEURO: Pt is awake/alert/appropriate, moves all extremitiesx4.  No facial droop.   EXTREMITIES: pulses normal/equal, full ROM Distal pulses equal and intact. No lesions noted to the palms or soles SKIN: Pustular rash  noted throughout the torso.  There is no discharge.  No significant erythema, no abscess Breast exam performed with nurse Drinda Butts present Breasts appear symmetric.  No obvious discharge or bleeding.  No significant rash noted to the breast  ED Results / Procedures / Treatments   Labs (all labs ordered are listed, but only abnormal results are displayed) Labs Reviewed  CBC WITH DIFFERENTIAL/PLATELET - Abnormal; Notable for the following components:      Result Value   Hemoglobin 15.1 (*)    MCHC 36.1 (*)    RDW 11.3 (*)    All other components within normal limits  URINALYSIS, ROUTINE W REFLEX MICROSCOPIC - Abnormal; Notable for the following components:   APPearance HAZY (*)    Glucose, UA 100 (*)    Leukocytes,Ua SMALL (*)    All other components within normal limits  URINALYSIS, MICROSCOPIC (REFLEX) - Abnormal; Notable for the following components:   Bacteria, UA FEW (*)    All other components within normal limits  COMPREHENSIVE METABOLIC PANEL  PREGNANCY, URINE    EKG None  Radiology No results found.  Procedures Procedures    Medications Ordered in ED Medications - No data to display  ED Course/ Medical Decision Making/ A&P                                 Medical Decision Making Amount and/or Complexity of Data Reviewed Labs: ordered.   Patient is already on topical steroids, topical clindamycin and oral doxycycline.  This has been ongoing for up to a month.  Advised patient to follow-up with dermatology.  Outpatient labs were performed and she had a normal CK recently. Labs tonight are overall reassuring At this point no further indication for workup at this time       Final Clinical Impression(s) / ED Diagnoses Final diagnoses:  Rash    Rx / DC Orders ED Discharge Orders     None         Zadie Rhine, MD 06/29/23 [phone]

## 2023-07-06 DIAGNOSIS — R202 Paresthesia of skin: Secondary | ICD-10-CM | POA: Diagnosis not present

## 2023-07-06 DIAGNOSIS — E10618 Type 1 diabetes mellitus with other diabetic arthropathy: Secondary | ICD-10-CM | POA: Diagnosis not present

## 2023-07-06 DIAGNOSIS — E782 Mixed hyperlipidemia: Secondary | ICD-10-CM | POA: Diagnosis not present

## 2023-07-06 DIAGNOSIS — F411 Generalized anxiety disorder: Secondary | ICD-10-CM | POA: Diagnosis not present

## 2023-07-06 DIAGNOSIS — M2550 Pain in unspecified joint: Secondary | ICD-10-CM | POA: Diagnosis not present

## 2023-07-07 DIAGNOSIS — G629 Polyneuropathy, unspecified: Secondary | ICD-10-CM | POA: Diagnosis not present

## 2023-07-07 DIAGNOSIS — L739 Follicular disorder, unspecified: Secondary | ICD-10-CM | POA: Diagnosis not present

## 2023-07-16 DIAGNOSIS — Z419 Encounter for procedure for purposes other than remedying health state, unspecified: Secondary | ICD-10-CM | POA: Diagnosis not present

## 2023-07-20 DIAGNOSIS — R718 Other abnormality of red blood cells: Secondary | ICD-10-CM | POA: Diagnosis not present

## 2023-07-20 DIAGNOSIS — G894 Chronic pain syndrome: Secondary | ICD-10-CM | POA: Diagnosis not present

## 2023-07-20 DIAGNOSIS — E10618 Type 1 diabetes mellitus with other diabetic arthropathy: Secondary | ICD-10-CM | POA: Diagnosis not present

## 2023-07-20 DIAGNOSIS — Z130 Encounter for screening for diseases of the blood and blood-forming organs and certain disorders involving the immune mechanism: Secondary | ICD-10-CM | POA: Diagnosis not present

## 2023-07-31 ENCOUNTER — Encounter: Payer: Self-pay | Admitting: Endocrinology

## 2023-07-31 DIAGNOSIS — E1065 Type 1 diabetes mellitus with hyperglycemia: Secondary | ICD-10-CM

## 2023-08-01 ENCOUNTER — Other Ambulatory Visit: Payer: Self-pay

## 2023-08-01 MED ORDER — OMNIPOD 5 DEXG7G6 PODS GEN 5 MISC: 1.0000 | 3 refills | Status: DC

## 2023-08-16 ENCOUNTER — Encounter (HOSPITAL_BASED_OUTPATIENT_CLINIC_OR_DEPARTMENT_OTHER): Payer: Self-pay | Admitting: Emergency Medicine

## 2023-08-16 ENCOUNTER — Emergency Department (HOSPITAL_BASED_OUTPATIENT_CLINIC_OR_DEPARTMENT_OTHER): Payer: Medicaid Other | Admitting: Radiology

## 2023-08-16 ENCOUNTER — Observation Stay (HOSPITAL_BASED_OUTPATIENT_CLINIC_OR_DEPARTMENT_OTHER)
Admission: EM | Admit: 2023-08-16 | Discharge: 2023-08-17 | Disposition: A | Discharge status: Home / Self Care | Payer: Medicaid Other | Attending: Internal Medicine | Admitting: Internal Medicine

## 2023-08-16 ENCOUNTER — Other Ambulatory Visit: Payer: Self-pay

## 2023-08-16 DIAGNOSIS — Z794 Long term (current) use of insulin: Secondary | ICD-10-CM | POA: Insufficient documentation

## 2023-08-16 DIAGNOSIS — Z7722 Contact with and (suspected) exposure to environmental tobacco smoke (acute) (chronic): Secondary | ICD-10-CM | POA: Insufficient documentation

## 2023-08-16 DIAGNOSIS — Z79899 Other long term (current) drug therapy: Secondary | ICD-10-CM | POA: Diagnosis not present

## 2023-08-16 DIAGNOSIS — E1010 Type 1 diabetes mellitus with ketoacidosis without coma: Secondary | ICD-10-CM | POA: Diagnosis not present

## 2023-08-16 DIAGNOSIS — R739 Hyperglycemia, unspecified: Secondary | ICD-10-CM | POA: Diagnosis not present

## 2023-08-16 DIAGNOSIS — Z419 Encounter for procedure for purposes other than remedying health state, unspecified: Secondary | ICD-10-CM | POA: Diagnosis not present

## 2023-08-16 DIAGNOSIS — E1110 Type 2 diabetes mellitus with ketoacidosis without coma: Secondary | ICD-10-CM | POA: Diagnosis present

## 2023-08-16 DIAGNOSIS — E1065 Type 1 diabetes mellitus with hyperglycemia: Secondary | ICD-10-CM | POA: Diagnosis present

## 2023-08-16 LAB — CBG MONITORING, ED
Glucose-Capillary: 399 mg/dL — ABNORMAL HIGH (ref 70–99)
Glucose-Capillary: 366 mg/dL — ABNORMAL HIGH (ref 70–99)
Glucose-Capillary: 328 mg/dL — ABNORMAL HIGH (ref 70–99)
Glucose-Capillary: 206 mg/dL — ABNORMAL HIGH (ref 70–99)

## 2023-08-16 LAB — I-STAT VENOUS BLOOD GAS, ED
Acid-base deficit: 10 mmol/L — ABNORMAL HIGH (ref 0.0–2.0)
Acid-base deficit: 13 mmol/L — ABNORMAL HIGH (ref 0.0–2.0)
Bicarbonate: 14.8 mmol/L — ABNORMAL LOW (ref 20.0–28.0)
Bicarbonate: 12.1 mmol/L — ABNORMAL LOW (ref 20.0–28.0)
Calcium, Ion: 1.15 mmol/L (ref 1.15–1.40)
Calcium, Ion: 1.15 mmol/L (ref 1.15–1.40)
HCT: 43 % (ref 36.0–46.0)
HCT: 41 % (ref 36.0–46.0)
Hemoglobin: 14.6 g/dL (ref 12.0–15.0)
Hemoglobin: 13.9 g/dL (ref 12.0–15.0)
O2 Saturation: 85 %
O2 Saturation: 93 %
Patient temperature: 98.9
Potassium: 4.3 mmol/L (ref 3.5–5.1)
Potassium: 4.6 mmol/L (ref 3.5–5.1)
Sodium: 134 mmol/L — ABNORMAL LOW (ref 135–145)
Sodium: 130 mmol/L — ABNORMAL LOW (ref 135–145)
TCO2: 16 mmol/L — ABNORMAL LOW (ref 22–32)
TCO2: 13 mmol/L — ABNORMAL LOW (ref 22–32)
pCO2, Ven: 30.6 mm[Hg] — ABNORMAL LOW (ref 44–60)
pCO2, Ven: 25.4 mm[Hg] — ABNORMAL LOW (ref 44–60)
pH, Ven: 7.295 (ref 7.25–7.43)
pH, Ven: 7.284 (ref 7.25–7.43)
pO2, Ven: 56 mm[Hg] — ABNORMAL HIGH (ref 32–45)
pO2, Ven: 73 mm[Hg] — ABNORMAL HIGH (ref 32–45)

## 2023-08-16 LAB — BASIC METABOLIC PANEL WITH GFR
Anion gap: 19 — ABNORMAL HIGH (ref 5–15)
BUN: 18 mg/dL (ref 6–20)
CO2: 12 mmol/L — ABNORMAL LOW (ref 22–32)
Calcium: 8.1 mg/dL — ABNORMAL LOW (ref 8.9–10.3)
Chloride: 103 mmol/L (ref 98–111)
Creatinine, Ser: 0.72 mg/dL (ref 0.44–1.00)
GFR, Estimated: >60 mL/min (ref >60)
Glucose, Bld: 274 mg/dL — ABNORMAL HIGH (ref 70–99)
Potassium: 3.8 mmol/L (ref 3.5–5.1)
Sodium: 134 mmol/L — ABNORMAL LOW (ref 135–145)

## 2023-08-16 LAB — URINALYSIS, ROUTINE W REFLEX MICROSCOPIC
Bacteria, UA: NONE SEEN
Bilirubin Urine: NEGATIVE
Glucose, UA: >1000 mg/dL — AB
Hgb urine dipstick: NEGATIVE
Ketones, ur: >80 mg/dL — AB
Leukocytes,Ua: NEGATIVE
Nitrite: NEGATIVE
Protein, ur: NEGATIVE mg/dL
Specific Gravity, Urine: 1.024 (ref 1.005–1.030)
pH: 5.5 (ref 5.0–8.0)

## 2023-08-16 LAB — BASIC METABOLIC PANEL
Anion gap: 19 — ABNORMAL HIGH (ref 5–15)
BUN: 16 mg/dL (ref 6–20)
CO2: 16 mmol/L — ABNORMAL LOW (ref 22–32)
Calcium: 9.2 mg/dL (ref 8.9–10.3)
Chloride: 97 mmol/L — ABNORMAL LOW (ref 98–111)
Creatinine, Ser: 0.69 mg/dL (ref 0.44–1.00)
GFR, Estimated: >60 mL/min (ref >60)
Glucose, Bld: 354 mg/dL — ABNORMAL HIGH (ref 70–99)
Potassium: 4.3 mmol/L (ref 3.5–5.1)
Sodium: 132 mmol/L — ABNORMAL LOW (ref 135–145)

## 2023-08-16 LAB — CBC
HCT: 44.4 % (ref 36.0–46.0)
Hemoglobin: 15.5 g/dL — ABNORMAL HIGH (ref 12.0–15.0)
MCH: 30.4 pg (ref 26.0–34.0)
MCHC: 34.9 g/dL (ref 30.0–36.0)
MCV: 87.1 fL (ref 80.0–100.0)
Platelets: 261 10*3/uL (ref 150–400)
RBC: 5.1 MIL/uL (ref 3.87–5.11)
RDW: 12 % (ref 11.5–15.5)
WBC: 6 10*3/uL (ref 4.0–10.5)
nRBC: 0 % (ref 0.0–0.2)

## 2023-08-16 LAB — PREGNANCY, URINE: Preg Test, Ur: NEGATIVE

## 2023-08-16 LAB — BETA-HYDROXYBUTYRIC ACID: Beta-Hydroxybutyric Acid: 5.96 mmol/L — ABNORMAL HIGH (ref 0.05–0.27)

## 2023-08-16 MED ORDER — LACTATED RINGERS IV BOLUS
1000.0000 mL | Freq: Once | INTRAVENOUS | Status: AC
  Administered 2023-08-16: 1000 mL via INTRAVENOUS

## 2023-08-16 MED ORDER — ONDANSETRON HCL 4 MG/2ML IJ SOLN
4.0000 mg | Freq: Once | INTRAMUSCULAR | Status: AC | PRN
  Administered 2023-08-16: 4 mg via INTRAVENOUS
  Filled 2023-08-16: qty 2

## 2023-08-16 MED ORDER — LACTATED RINGERS IV SOLN
INTRAVENOUS | Status: DC
Start: 2023-08-16 — End: 2023-08-17

## 2023-08-16 MED ORDER — DIPHENHYDRAMINE HCL 50 MG/ML IJ SOLN
25.0000 mg | Freq: Once | INTRAMUSCULAR | Status: AC
  Administered 2023-08-16: 25 mg via INTRAVENOUS
  Filled 2023-08-16: qty 1

## 2023-08-16 MED ORDER — DEXTROSE 50 % IV SOLN: 0.0000 mL | INTRAVENOUS | Status: DC | PRN

## 2023-08-16 MED ORDER — POTASSIUM CHLORIDE 10 MEQ/100ML IV SOLN
10.0000 meq | INTRAVENOUS | Status: DC
  Administered 2023-08-16: 10 meq via INTRAVENOUS
  Filled 2023-08-16 (×2): qty 100

## 2023-08-16 MED ORDER — INSULIN REGULAR(HUMAN) IN NACL 100-0.9 UT/100ML-% IV SOLN
INTRAVENOUS | Status: DC
  Administered 2023-08-16: 8 [IU]/h via INTRAVENOUS
  Filled 2023-08-16: qty 100

## 2023-08-16 MED ORDER — DEXTROSE IN LACTATED RINGERS 5 % IV SOLN
INTRAVENOUS | Status: DC
Start: 2023-08-16 — End: 2023-08-17

## 2023-08-16 NOTE — ED Notes (Signed)
 PT STATES SHE IS ALLERGIC TO THE ADHESIVE ON THE EKG LEADS.

## 2023-08-16 NOTE — Progress Notes (Signed)
 Plan of Care Note for accepted transfer   Patient: Kelli Davis MRN: [id number]   DOA: 08/16/2023  Facility requesting transfer: [facility]. Requesting Provider: Dr. Zackowski. Reason for transfer: DKA. Facility course: 23 year old female with known history of diabetes mellitus type 1 noted that her blood sugars were running high and later realized as per ER physician that insulin  pump was malfunctioning.  In the ER patient was noticed to have elevated blood sugars.  Elevated anion gap consistent with DKA and was started on DKA protocol and admitted for further management.  Plan of care: The patient is accepted for admission to Progressive unit, at [facility]..   Author: Redia LOISE Cleaver, MD 08/16/2023  Check [email or web address] for on-call coverage.  Nursing staff, Please call [facility] Admits & Consults System-Wide number on Amion as soon as patient's arrival, so appropriate admitting provider can evaluate the pt.

## 2023-08-16 NOTE — ED Triage Notes (Signed)
 Insulin pump stopped around 0230, she didn't wake up til about 530 am. She is currently off the pump and using sliding scale. Pt does have continuous cbg monitoring. Running around 300, has ketones, n/v since them.

## 2023-08-16 NOTE — ED Provider Notes (Signed)
 [place] EMERGENCY DEPARTMENT AT [facility] Provider Note   CSN: [id number] Arrival date & time: 08/16/23  1613     History  Chief Complaint  Patient presents with   Hyperglycemia    Kelli Davis is a 23 y.o. female.  Patient is a type I diabetic.  Her insulin  pump stopped around 2:30 in the morning.  She did not wake up till 530 she noted that.  She tried to do some boluses with the pump and they want work she tried some sliding scale insulin  that did not really work well enough.  Patient with some nausea vomiting and some intermittent abdominal pain.  Patient felt fine yesterday.  Past medical history sniffing for type 1 diabetes history of febrile seizure attention deficit disorder eating disorder.  Patient is never used tobacco products.  Patient's not had any significant abdominal surgeries.  Patient last seen in the emergency department on November 14 for a rash last seen for hyperglycemia April 2023 and was admitted at that time for diabetic ketoacidosis.       Home Medications Prior to Admission medications   Medication Sig Start Date End Date Taking? Authorizing Provider  ACCU-CHEK FASTCLIX LANCETS MISC Check sugar 10 x daily 10/21/15   Verdon Darnel, NP  buPROPion  (WELLBUTRIN  XL) 300 MG 24 hr tablet Take 1 tablet (300 mg total) by mouth daily. 02/17/22   Viviana Aleck DASEN, FNP  Continuous Glucose Sensor (DEXCOM G7 SENSOR) MISC 1 Device by Does not apply route continuous. 04/20/23   Thapa, [place], MD  Continuous Glucose Transmitter (DEXCOM G6 TRANSMITTER) MISC 1 application  by Does not apply route continuous as needed. 05/01/23   Thapa, [place], MD  escitalopram  (LEXAPRO ) 20 MG tablet Take 1 tablet (20 mg total) by mouth daily. 02/17/22   Viviana Aleck DASEN, FNP  fluticasone (FLONASE) 50 MCG/ACT nasal spray Place 1 spray into both nostrils daily as needed for allergies. 02/06/19   [provider]  Glucagon  (BAQSIMI  ONE PACK) 3 MG/DOSE POWD Place 1 Dose into the  nose as needed. 10/07/19   Verdon Darnel, NP  glucose blood test strip Use as instructed 05/13/21   Verdon Darnel, NP  insulin  aspart (NOVOLOG  FLEXPEN) 100 UNIT/ML FlexPen As per carb count 1:5 INJECT UP TO 50 UNITS SUBCUTANEOUSLY DAILY 04/20/23   Thapa, [place], MD  insulin  aspart (NOVOLOG ) 100 UNIT/ML injection 50 units max daily dose per pump 05/12/23   Thapa, [place], MD  Insulin  Degludec FlexTouch 100 UNIT/ML SOPN INJECT 32 UNITS INTO THE SKIN DAILY. 05/12/23   Thapa, [place], MD  Insulin  Disposable Pump (OMNIPOD 5 DEXG7G6 PODS GEN 5) MISC 1 each by Does not apply route every 3 (three) days. 08/01/23   Thapa, [place], MD  insulin  glargine (LANTUS  SOLOSTAR) 100 UNIT/ML Solostar Pen Inject 32 Units into the skin daily. 05/18/23   Thapa, [place], MD  Insulin  Pen Needle (BD PEN NEEDLE NANO U/F) 32G X 4 MM MISC Use with insulin  pens 6x daily 04/20/23   Thapa, [place], MD  norgestimate -ethinyl estradiol  (SPRINTEC 28) 0.25-35 MG-MCG tablet TAKE 1 TABLET DAILY. DISCARD PLACEBOS AND TAKE ACTIVE PILLS FOR CONTINUOUS CYCLING 02/17/22   Viviana Aleck T, FNP  prazosin  (MINIPRESS ) 2 MG capsule Take 1 capsule (2 mg total) by mouth at bedtime. 02/17/22   Viviana Aleck DASEN, FNP      Allergies    Penicillins    Review of Systems   Review of Systems  Constitutional:  Negative for chills and fever.  HENT:  Negative for ear  pain and sore throat.   Eyes:  Negative for pain and visual disturbance.  Respiratory:  Negative for cough and shortness of breath.   Cardiovascular:  Negative for chest pain and palpitations.  Gastrointestinal:  Positive for abdominal pain, nausea and vomiting.  Genitourinary:  Negative for dysuria and hematuria.  Musculoskeletal:  Negative for arthralgias and back pain.  Skin:  Negative for color change and rash.  Neurological:  Negative for seizures and syncope.  All other systems reviewed and are negative.   Physical Exam Updated Vital Signs BP 109/67   Pulse 100   Temp 97.7 F (36.5 C)  (Oral)   Resp 18   SpO2 100%  Physical Exam Vitals and nursing note reviewed.  Constitutional:      General: She is not in acute distress.    Appearance: Normal appearance. She is well-developed.  HENT:     Head: Normocephalic and atraumatic.     Mouth/Throat:     Mouth: Mucous membranes are dry.  Eyes:     Extraocular Movements: Extraocular movements intact.     Conjunctiva/sclera: Conjunctivae normal.     Pupils: Pupils are equal, round, and reactive to light.  Cardiovascular:     Rate and Rhythm: Normal rate and regular rhythm.     Heart sounds: No murmur heard. Pulmonary:     Effort: Pulmonary effort is normal. No respiratory distress.     Breath sounds: Normal breath sounds.  Abdominal:     General: There is no distension.     Palpations: Abdomen is soft.     Tenderness: There is no abdominal tenderness.  Musculoskeletal:        General: No swelling.     Cervical back: Normal range of motion and neck supple.  Skin:    General: Skin is warm and dry.     Capillary Refill: Capillary refill takes less than 2 seconds.  Neurological:     General: No focal deficit present.     Mental Status: She is alert and oriented to person, place, and time.  Psychiatric:        Mood and Affect: Mood normal.     ED Results / Procedures / Treatments   Labs (all labs ordered are listed, but only abnormal results are displayed) Labs Reviewed  BASIC METABOLIC PANEL - Abnormal; Notable for the following components:      Result Value   Sodium 132 (*)    Chloride 97 (*)    CO2 16 (*)    Glucose, Bld 354 (*)    Anion gap 19 (*)    All other components within normal limits  CBC - Abnormal; Notable for the following components:   Hemoglobin 15.5 (*)    All other components within normal limits  URINALYSIS, ROUTINE W REFLEX MICROSCOPIC - Abnormal; Notable for the following components:   Color, Urine COLORLESS (*)    Glucose, UA >1,000 (*)    Ketones, ur >80 (*)    All other components  within normal limits  CBG MONITORING, ED - Abnormal; Notable for the following components:   Glucose-Capillary 399 (*)    All other components within normal limits  I-STAT VENOUS BLOOD GAS, ED - Abnormal; Notable for the following components:   pCO2, Ven 30.6 (*)    pO2, Ven 56 (*)    Bicarbonate 14.8 (*)    TCO2 16 (*)    Acid-base deficit 10.0 (*)    Sodium 134 (*)    All other components within normal  limits  PREGNANCY, URINE  BETA-HYDROXYBUTYRIC ACID  CBG MONITORING, ED  CBG MONITORING, ED  I-STAT VENOUS BLOOD GAS, ED    EKG None  Radiology No results found.  Procedures Procedures    Medications Ordered in ED Medications  lactated ringers  bolus 1,000 mL (has no administration in time range)  insulin  regular, human (MYXREDLIN ) 100 units/ 100 mL infusion (has no administration in time range)  lactated ringers  infusion (has no administration in time range)  dextrose  5 % in lactated ringers  infusion (has no administration in time range)  dextrose  50 % solution 0-50 mL (has no administration in time range)  potassium chloride  10 mEq in 100 mL IVPB (has no administration in time range)  ondansetron  (ZOFRAN ) injection 4 mg (4 mg Intravenous Given 08/16/23 1826)    ED Course/ Medical Decision Making/ A&P                                 Medical Decision Making Amount and/or Complexity of Data Reviewed Labs: ordered. Radiology: ordered.  Risk Prescription drug management.   Patient's blood sugars here in the mid to upper 300s.  Patient's pH was on venous was 7.2 but her CO2 on i-STAT was 16.  Seems to be consistent with DKA.  Basic metabolic panel CO2 was 16 on that as well blood sugar 354 anion gap of 19 renal function was normal white count normal at 6 hemoglobin 15.5 platelets of 261 urinalysis just greater than 80 for ketones and high for for sugar.  But no signs of urinary tract infection.  Pregnancy test negative.  Initial fingerstick was 399.  Based on this we  will start her for treatment for DKA with the hyperglycemic Endo tool.  And patient will require admission  CRITICAL CARE Performed by: Sneha Willig Total critical care time: 60 minutes Critical care time was exclusive of separately billable procedures and treating other patients. Critical care was necessary to treat or prevent imminent or life-threatening deterioration. Critical care was time spent personally by me on the following activities: development of treatment plan with patient and/or surrogate as well as nursing, discussions with consultants, evaluation of patient's response to treatment, examination of patient, obtaining history from patient or surrogate, ordering and performing treatments and interventions, ordering and review of laboratory studies, ordering and review of radiographic studies, pulse oximetry and re-evaluation of patient's condition.  Final Clinical Impression(s) / ED Diagnoses Final diagnoses:  Diabetic ketoacidosis without coma associated with type 1 diabetes mellitus [facility])    Rx / DC Orders ED Discharge Orders     None         Geraldene Hamilton, MD 08/16/23 [phone]

## 2023-08-17 DIAGNOSIS — E1065 Type 1 diabetes mellitus with hyperglycemia: Secondary | ICD-10-CM | POA: Diagnosis present

## 2023-08-17 DIAGNOSIS — M797 Fibromyalgia: Secondary | ICD-10-CM | POA: Diagnosis not present

## 2023-08-17 DIAGNOSIS — Z7722 Contact with and (suspected) exposure to environmental tobacco smoke (acute) (chronic): Secondary | ICD-10-CM | POA: Diagnosis not present

## 2023-08-17 DIAGNOSIS — Z7689 Persons encountering health services in other specified circumstances: Secondary | ICD-10-CM | POA: Diagnosis not present

## 2023-08-17 DIAGNOSIS — E1010 Type 1 diabetes mellitus with ketoacidosis without coma: Secondary | ICD-10-CM

## 2023-08-17 DIAGNOSIS — Z794 Long term (current) use of insulin: Secondary | ICD-10-CM | POA: Diagnosis not present

## 2023-08-17 DIAGNOSIS — Z79899 Other long term (current) drug therapy: Secondary | ICD-10-CM | POA: Diagnosis not present

## 2023-08-17 LAB — CBG MONITORING, ED
Glucose-Capillary: 176 mg/dL — ABNORMAL HIGH (ref 70–99)
Glucose-Capillary: 185 mg/dL — ABNORMAL HIGH (ref 70–99)
Glucose-Capillary: 198 mg/dL — ABNORMAL HIGH (ref 70–99)
Glucose-Capillary: 203 mg/dL — ABNORMAL HIGH (ref 70–99)
Glucose-Capillary: 215 mg/dL — ABNORMAL HIGH (ref 70–99)
Glucose-Capillary: 232 mg/dL — ABNORMAL HIGH (ref 70–99)

## 2023-08-17 LAB — COMPREHENSIVE METABOLIC PANEL
ALT: 7 U/L (ref 0–44)
ALT: 7 U/L (ref 0–44)
AST: 11 U/L — ABNORMAL LOW (ref 15–41)
AST: 10 U/L — ABNORMAL LOW (ref 15–41)
Albumin: 3.5 g/dL (ref 3.5–5.0)
Albumin: 3.3 g/dL — ABNORMAL LOW (ref 3.5–5.0)
Alkaline Phosphatase: 40 U/L (ref 38–126)
Alkaline Phosphatase: 44 U/L (ref 38–126)
Anion gap: 8 (ref 5–15)
Anion gap: 6 (ref 5–15)
BUN: 16 mg/dL (ref 6–20)
BUN: 13 mg/dL (ref 6–20)
CO2: 21 mmol/L — ABNORMAL LOW (ref 22–32)
CO2: 21 mmol/L — ABNORMAL LOW (ref 22–32)
Calcium: 8.2 mg/dL — ABNORMAL LOW (ref 8.9–10.3)
Calcium: 7.9 mg/dL — ABNORMAL LOW (ref 8.9–10.3)
Chloride: 106 mmol/L (ref 98–111)
Chloride: 106 mmol/L (ref 98–111)
Creatinine, Ser: 0.63 mg/dL (ref 0.44–1.00)
Creatinine, Ser: 0.65 mg/dL (ref 0.44–1.00)
GFR, Estimated: >60 mL/min (ref >60)
GFR, Estimated: >60 mL/min (ref >60)
Glucose, Bld: 249 mg/dL — ABNORMAL HIGH (ref 70–99)
Glucose, Bld: 248 mg/dL — ABNORMAL HIGH (ref 70–99)
Potassium: 3.6 mmol/L (ref 3.5–5.1)
Potassium: 3.5 mmol/L (ref 3.5–5.1)
Sodium: 135 mmol/L (ref 135–145)
Sodium: 133 mmol/L — ABNORMAL LOW (ref 135–145)
Total Bilirubin: 0.8 mg/dL (ref 0.0–1.2)
Total Bilirubin: 0.7 mg/dL (ref 0.0–1.2)
Total Protein: 5.8 g/dL — ABNORMAL LOW (ref 6.5–8.1)
Total Protein: 5.6 g/dL — ABNORMAL LOW (ref 6.5–8.1)

## 2023-08-17 LAB — CBC
HCT: 35.3 % — ABNORMAL LOW (ref 36.0–46.0)
Hemoglobin: 12.6 g/dL (ref 12.0–15.0)
MCH: 31 pg (ref 26.0–34.0)
MCHC: 35.7 g/dL (ref 30.0–36.0)
MCV: 86.9 fL (ref 80.0–100.0)
Platelets: 238 10*3/uL (ref 150–400)
RBC: 4.06 MIL/uL (ref 3.87–5.11)
RDW: 12.3 % (ref 11.5–15.5)
WBC: 6.4 10*3/uL (ref 4.0–10.5)
nRBC: 0 % (ref 0.0–0.2)

## 2023-08-17 LAB — HIV ANTIBODY (ROUTINE TESTING W REFLEX): HIV Screen 4th Generation wRfx: NONREACTIVE

## 2023-08-17 LAB — BASIC METABOLIC PANEL
Anion gap: 10 (ref 5–15)
BUN: 17 mg/dL (ref 6–20)
CO2: 18 mmol/L — ABNORMAL LOW (ref 22–32)
Calcium: 8.1 mg/dL — ABNORMAL LOW (ref 8.9–10.3)
Chloride: 105 mmol/L (ref 98–111)
Creatinine, Ser: 0.65 mg/dL (ref 0.44–1.00)
GFR, Estimated: >60 mL/min (ref >60)
Glucose, Bld: 217 mg/dL — ABNORMAL HIGH (ref 70–99)
Potassium: 3.9 mmol/L (ref 3.5–5.1)
Sodium: 133 mmol/L — ABNORMAL LOW (ref 135–145)

## 2023-08-17 LAB — CREATININE, SERUM
Creatinine, Ser: 0.73 mg/dL (ref 0.44–1.00)
GFR, Estimated: >60 mL/min (ref >60)

## 2023-08-17 LAB — GLUCOSE, CAPILLARY
Glucose-Capillary: 162 mg/dL — ABNORMAL HIGH (ref 70–99)
Glucose-Capillary: 119 mg/dL — ABNORMAL HIGH (ref 70–99)
Glucose-Capillary: 121 mg/dL — ABNORMAL HIGH (ref 70–99)
Glucose-Capillary: 108 mg/dL — ABNORMAL HIGH (ref 70–99)
Glucose-Capillary: 106 mg/dL — ABNORMAL HIGH (ref 70–99)

## 2023-08-17 MED ORDER — INSULIN PUMP
Freq: Three times a day (TID) | SUBCUTANEOUS | Status: DC
  Filled 2023-08-17: qty 1

## 2023-08-17 MED ORDER — ACETAMINOPHEN 650 MG RE SUPP: 650.0000 mg | Freq: Four times a day (QID) | RECTAL | Status: DC | PRN

## 2023-08-17 MED ORDER — MELATONIN 3 MG PO TABS: 3.0000 mg | ORAL_TABLET | Freq: Every evening | ORAL | Status: DC | PRN

## 2023-08-17 MED ORDER — ENOXAPARIN SODIUM 40 MG/0.4ML IJ SOSY: 40.0000 mg | PREFILLED_SYRINGE | INTRAMUSCULAR | Status: DC

## 2023-08-17 MED ORDER — ONDANSETRON HCL 4 MG/2ML IJ SOLN: 4.0000 mg | Freq: Four times a day (QID) | INTRAMUSCULAR | Status: DC | PRN

## 2023-08-17 MED ORDER — POTASSIUM CHLORIDE CRYS ER 20 MEQ PO TBCR
40.0000 meq | EXTENDED_RELEASE_TABLET | Freq: Once | ORAL | Status: AC
  Administered 2023-08-17: 40 meq via ORAL
  Filled 2023-08-17: qty 2

## 2023-08-17 MED ORDER — ONDANSETRON HCL 4 MG PO TABS: 4.0000 mg | ORAL_TABLET | Freq: Four times a day (QID) | ORAL | Status: DC | PRN

## 2023-08-17 MED ORDER — ACETAMINOPHEN 325 MG PO TABS: 650.0000 mg | ORAL_TABLET | Freq: Four times a day (QID) | ORAL | Status: DC | PRN

## 2023-08-17 NOTE — ED Notes (Signed)
 Sent message to Dr. Toniann Fail requesting to discontinue insulin drip, MD stated he is not here at [facility] ED and to ask the ED MD. Dr. Bernette Mayers stated he did not feel comfortable discontinuing the DKA protocol.

## 2023-08-17 NOTE — TOC Transition Note (Signed)
 Transition of Care [facility]) - Discharge Note   Patient Details  Name: Kelli Davis MRN: [id number] Date of Birth: 07-08-2001  [facility] CM/SW Contact:  Hendricks KANDICE Her, RN Phone Number: 08/17/2023, 2:45 PM   Clinical Narrative:     Patient will DC to home today. No TOC needs identified and patient will follow up as directed on AVS.           Patient Goals and CMS Choice            Discharge Placement                       Discharge Plan and Services Additional resources added to the After Visit Summary for                                       Social Drivers of Health (SDOH) Interventions SDOH Screenings   Depression (PHQ2-9): Medium Risk (02/17/2022)  Social Connections: Unknown (09/27/2022)   Received from [facility]  Tobacco Use: Medium Risk (08/16/2023)     Readmission Risk Interventions     No data to display

## 2023-08-17 NOTE — Discharge Summary (Signed)
 PATIENT DETAILS Name: Kelli Davis Age: 23 y.o. Sex: female Date of Birth: [birth year] MRN: [id number]. Admitting Physician: Redia LOISE Cleaver, MD ERE:[place], No Pcp Per  Admit Date: 08/16/2023 Discharge date: 08/17/2023  Recommendations for Outpatient Follow-up:  Follow up with PCP in 1-2 weeks Please obtain CMP/CBC in one week  Admitted From:  Home  Disposition: Home   Discharge Condition: good  CODE STATUS:   Code Status: Full Code   Diet recommendation:  Diet Order             Diet Carb Modified Fluid consistency: Thin; Room service appropriate? Yes  Diet effective now           Diet - low sodium heart healthy                    Brief Summary: Kelli Davis is a 23 y.o. female with medical history significant of DM-1 on insulin  pump, fibromyalgia-who was transferred from [facility] for management of DKA.   Brief Hospital Course: DKA Likely due to malfunctioning of the insulin  pump-looks like the insertion site was bandaged. Treated with IV insulin /IV fluids-DKA has resolved  DM-1 Follows with [place] endocrinology-once DKA was resolved-she was placed back on insulin  pump.  She was observed for several hours-CBGs remained stable on the pump.  She is anxious to go home today.  BMI: Estimated body mass index is 24.24 kg/m as calculated from the following:   Height as of 06/28/23: 4' 11 (1.499 m).   Weight as of 06/28/23: 54.4 kg.    Discharge Diagnoses:  Active Problems:   DKA (diabetic ketoacidosis) (HCC)   Discharge Instructions:  Activity:  As tolerated   Discharge Instructions     Call MD for:  extreme fatigue   Complete by: As directed    Call MD for:  persistant dizziness or light-headedness   Complete by: As directed    Diet - low sodium heart healthy   Complete by: As directed    Discharge instructions   Complete by: As directed    Follow with Primary MD in 1-2 weeks  Please get a complete blood count and  chemistry panel checked by your Primary MD at your next visit, and again as instructed by your Primary MD.  Get Medicines reviewed and adjusted: Please take all your medications with you for your next visit with your Primary MD  Laboratory/radiological data: Please request your Primary MD to go over all hospital tests and procedure/radiological results at the follow up, please ask your Primary MD to get all Hospital records sent to his/her office.  In some cases, they will be blood work, cultures and biopsy results pending at the time of your discharge. Please request that your primary care M.D. follows up on these results.  Also Note the following: If you experience worsening of your admission symptoms, develop shortness of breath, life threatening emergency, suicidal or homicidal thoughts you must seek medical attention immediately by calling 911 or calling your MD immediately  if symptoms less severe.  You must read complete instructions/literature along with all the possible adverse reactions/side effects for all the Medicines you take and that have been prescribed to you. Take any new Medicines after you have completely understood and accpet all the possible adverse reactions/side effects.   Do not drive when taking Pain medications or sleeping medications (Benzodaizepines)  Do not take more than prescribed Pain, Sleep and Anxiety Medications. It is not advisable to combine anxiety,sleep and pain medications  without talking with your primary care practitioner  Special Instructions: If you have smoked or chewed Tobacco  in the last 2 yrs please stop smoking, stop any regular Alcohol  and or any Recreational drug use.  Wear Seat belts while driving.  Please note: You were cared for by a hospitalist during your hospital stay. Once you are discharged, your primary care physician will handle any further medical issues. Please note that NO REFILLS for any discharge medications will be  authorized once you are discharged, as it is imperative that you return to your primary care physician (or establish a relationship with a primary care physician if you do not have one) for your post hospital discharge needs so that they can reassess your need for medications and monitor your lab values.   Increase activity slowly   Complete by: As directed       Allergies as of 08/17/2023       Reactions   Penicillins Hives        Medication List     STOP taking these medications    Insulin  Degludec FlexTouch 100 UNIT/ML Sopn   Lantus  SoloStar 100 UNIT/ML Solostar Pen Generic drug: insulin  glargine       TAKE these medications    Accu-Chek FastClix Lancets Misc Check sugar 10 x daily   Baqsimi  One Pack 3 MG/DOSE Powd Generic drug: Glucagon  Place 1 Dose into the nose as needed. What changed: reasons to take this   BD Pen Needle Nano U/F 32G X 4 MM Misc Generic drug: Insulin  Pen Needle Use with insulin  pens 6x daily   buPROPion  300 MG 24 hr tablet Commonly known as: WELLBUTRIN  XL Take 1 tablet (300 mg total) by mouth daily.   Dexcom G6 Transmitter Misc 1 application  by Does not apply route continuous as needed.   Dexcom G7 Sensor Misc 1 Device by Does not apply route continuous.   escitalopram  20 MG tablet Commonly known as: LEXAPRO  Take 1 tablet (20 mg total) by mouth daily.   fluticasone 50 MCG/ACT nasal spray Commonly known as: FLONASE See admin instructions. 1 application to dexcom site before apply a new sensor to prevent rash   glucose blood test strip Use as instructed   insulin  aspart 100 UNIT/ML injection Commonly known as: novoLOG  50 units max daily dose per pump What changed:  how much to take how to take this when to take this additional instructions Another medication with the same name was removed. Continue taking this medication, and follow the directions you see here.   norgestimate -ethinyl estradiol  0.25-35 MG-MCG tablet Commonly  known as: Sprintec 28 TAKE 1 TABLET DAILY. DISCARD PLACEBOS AND TAKE ACTIVE PILLS FOR CONTINUOUS CYCLING   Omnipod 5 DexG7G6 Pods Gen 5 Misc 1 each by Does not apply route every 3 (three) days.   prazosin  2 MG capsule Commonly known as: MINIPRESS  Take 1 capsule (2 mg total) by mouth at bedtime.   pregabalin 75 MG capsule Commonly known as: LYRICA Take 75 mg by mouth at bedtime.        Follow-up Information     Thapa, Sudan, MD. Schedule an appointment as soon as possible for a visit in 1 week(s).   Specialty: Endocrinology Contact information: [address] [place] [address] [phone]                Allergies  Allergen Reactions   Penicillins Hives     Other Procedures/Studies: DG Chest 1 View Result Date: 08/16/2023  CLINICAL DATA:  807513 Hyperglycemia [id number] EXAM: CHEST  1 VIEW COMPARISON:  08/09/2021 chest radiograph. FINDINGS: Stable cardiomediastinal silhouette with normal heart size. No pneumothorax. No pleural effusion. Lungs appear clear, with no acute consolidative airspace disease and no pulmonary edema. IMPRESSION: No active disease. Electronically Signed   By: Selinda DELENA Blue M.D.   On: 08/16/2023 21:03     TODAY-DAY OF DISCHARGE:  Subjective:   Kelli Davis today has no headache,no chest abdominal pain,no new weakness tingling or numbness, feels much better wants to go home today.   Objective:   Blood pressure (!) 96/55, pulse 78, temperature 97.8 F (36.6 C), temperature source Oral, resp. rate 13, SpO2 99%. No intake or output data in the 24 hours ending 08/17/23 1416 There were no vitals filed for this visit.  Exam: Awake Alert, Oriented *3, No new F.N deficits, Normal affect [place].AT,PERRAL Supple Neck,No JVD, No cervical lymphadenopathy appriciated.  Symmetrical Chest wall movement, Good air movement bilaterally, CTAB RRR,No Gallops,Rubs or new Murmurs, No Parasternal Heave +ve B.Sounds, Abd Soft, Non tender, No  organomegaly appriciated, No rebound -guarding or rigidity. No Cyanosis, Clubbing or edema, No new Rash or bruise   PERTINENT RADIOLOGIC STUDIES: DG Chest 1 View Result Date: 08/16/2023 CLINICAL DATA:  [id number] Hyperglycemia [id number] EXAM: CHEST  1 VIEW COMPARISON:  08/09/2021 chest radiograph. FINDINGS: Stable cardiomediastinal silhouette with normal heart size. No pneumothorax. No pleural effusion. Lungs appear clear, with no acute consolidative airspace disease and no pulmonary edema. IMPRESSION: No active disease. Electronically Signed   By: Selinda DELENA Blue M.D.   On: 08/16/2023 21:03     PERTINENT LAB RESULTS: CBC: Recent Labs    08/16/23 1642 08/16/23 1744 08/16/23 2032 08/17/23 1014  WBC 6.0  --   --  6.4  HGB 15.5*   < > 13.9 12.6  HCT 44.4   < > 41.0 35.3*  PLT 261  --   --  238   < > = values in this interval not displayed.   CMET CMP     Component Value Date/Time   NA 133 (L) 08/17/2023 0552   K 3.5 08/17/2023 0552   CL 106 08/17/2023 0552   CO2 21 (L) 08/17/2023 0552   GLUCOSE 248 (H) 08/17/2023 0552   BUN 13 08/17/2023 0552   CREATININE 0.73 08/17/2023 1014   CREATININE 0.92 08/31/2021 1130   CALCIUM  7.9 (L) 08/17/2023 0552   PROT 5.6 (L) 08/17/2023 0552   ALBUMIN 3.3 (L) 08/17/2023 0552   AST 10 (L) 08/17/2023 0552   ALT 7 08/17/2023 0552   ALKPHOS 44 08/17/2023 0552   BILITOT 0.7 08/17/2023 0552   GFR 124.24 04/20/2023 1632   GFRNONAA >60 08/17/2023 1014    GFR CrCl cannot be calculated (Unknown ideal weight.). No results for input(s): LIPASE, AMYLASE in the last 72 hours. No results for input(s): CKTOTAL, CKMB, CKMBINDEX, TROPONINI in the last 72 hours. Invalid input(s): POCBNP No results for input(s): DDIMER in the last 72 hours. No results for input(s): HGBA1C in the last 72 hours. No results for input(s): CHOL, HDL, LDLCALC, TRIG, CHOLHDL, LDLDIRECT in the last 72 hours. No results for input(s): TSH, T4TOTAL,  T3FREE, THYROIDAB in the last 72 hours.  Invalid input(s): FREET3 No results for input(s): VITAMINB12, FOLATE, FERRITIN, TIBC, IRON , RETICCTPCT in the last 72 hours. Coags: No results for input(s): INR in the last 72 hours.  Invalid input(s): PT Microbiology: No results found for this or any previous visit (from the past 240 hours).  FURTHER  DISCHARGE INSTRUCTIONS:  Get Medicines reviewed and adjusted: Please take all your medications with you for your next visit with your Primary MD  Laboratory/radiological data: Please request your Primary MD to go over all hospital tests and procedure/radiological results at the follow up, please ask your Primary MD to get all Hospital records sent to his/her office.  In some cases, they will be blood work, cultures and biopsy results pending at the time of your discharge. Please request that your primary care M.D. goes through all the records of your hospital data and follows up on these results.  Also Note the following: If you experience worsening of your admission symptoms, develop shortness of breath, life threatening emergency, suicidal or homicidal thoughts you must seek medical attention immediately by calling 911 or calling your MD immediately  if symptoms less severe.  You must read complete instructions/literature along with all the possible adverse reactions/side effects for all the Medicines you take and that have been prescribed to you. Take any new Medicines after you have completely understood and accpet all the possible adverse reactions/side effects.   Do not drive when taking Pain medications or sleeping medications (Benzodaizepines)  Do not take more than prescribed Pain, Sleep and Anxiety Medications. It is not advisable to combine anxiety,sleep and pain medications without talking with your primary care practitioner  Special Instructions: If you have smoked or chewed Tobacco  in the last 2 yrs please stop  smoking, stop any regular Alcohol  and or any Recreational drug use.  Wear Seat belts while driving.  Please note: You were cared for by a hospitalist during your hospital stay. Once you are discharged, your primary care physician will handle any further medical issues. Please note that NO REFILLS for any discharge medications will be authorized once you are discharged, as it is imperative that you return to your primary care physician (or establish a relationship with a primary care physician if you do not have one) for your post hospital discharge needs so that they can reassess your need for medications and monitor your lab values.  Total Time spent coordinating discharge including counseling, education and face to face time equals greater than 30 minutes.  Signed: Donalda Applebaum 08/17/2023 2:16 PM

## 2023-08-17 NOTE — Progress Notes (Signed)
 Patient discharged to home, AVS reviewed and all questions answered. Patients boyfriend to provide transportation

## 2023-08-17 NOTE — H&P (Signed)
 History and Physical    Patient: Kelli Davis FMW:[id number] DOB: 2000/09/06 DOA: 08/16/2023 DOS: the patient was seen and examined on 08/17/2023 PCP: Patient, No Pcp Per  Patient coming from: Home  Chief Complaint:  Chief Complaint  Patient presents with   Hyperglycemia   HPI: Kelli Davis is a 23 y.o. female with medical history significant of DM-1 on insulin  pump, fibromyalgia-who was transferred from [facility] for management of DKA.  Per patient-she went out on you received-and had a few alcoholic beverages-she did not wake up around 5-6 a.m on New Year's Day-when she wake up-she found her insulin  pump alarming.  She started having nausea/vomiting.  She has some NovoLog  at home which she injected.  She presented to med Center at [facility]-was found to have DKA-promptly started on IV insulin  and subsequently referred to the hospitalist service.  During my evaluation upon her transfer to [facility] is completely awake/alert feels much better-no longer with nausea or vomiting.  She does not have any abdominal pain.  She is requesting a diet  No fever No headache No chest pain No shortness of breath No abdominal pain No dysuria/hematuria No hematochezia  Review of Systems: As mentioned in the history of present illness. All other systems reviewed and are negative. Past Medical History:  Diagnosis Date   ADD (attention deficit disorder)    Eating disorder    Febrile seizure (HCC)    History of eye surgery    Physical growth delay    Type 1 diabetes mellitus not at goal [facility])    Past Surgical History:  Procedure Laterality Date   EYE MUSCLE SURGERY     Social History:  reports that she has never smoked. She has been exposed to tobacco smoke. She has never used smokeless tobacco. She reports current alcohol use. She reports that she does not use drugs.  Allergies  Allergen Reactions   Penicillins Hives    Did it involve swelling of the face/tongue/throat, SOB,  or low BP? Yes Did it involve sudden or severe rash/hives, skin peeling, or any reaction on the inside of your mouth or nose? Yes Did you need to seek medical attention at a hospital or doctor's office? Yes When did it last happen?    today   If all above answers are NO, may proceed with cephalosporin use.      Family History  Problem Relation Age of Onset   Cancer Maternal Grandmother    Hypertension Maternal Grandfather    Depression Maternal Grandfather    Diabetes Paternal Grandfather    Anxiety disorder Father    Depression Mother    Anxiety disorder Sister     Prior to Admission medications   Medication Sig Start Date End Date Taking? Authorizing Provider  ACCU-CHEK FASTCLIX LANCETS MISC Check sugar 10 x daily 10/21/15   Verdon Darnel, NP  buPROPion  (WELLBUTRIN  XL) 300 MG 24 hr tablet Take 1 tablet (300 mg total) by mouth daily. 02/17/22   Viviana Aleck DASEN, FNP  Continuous Glucose Sensor (DEXCOM G7 SENSOR) MISC 1 Device by Does not apply route continuous. 04/20/23   Thapa, Sudan, MD  Continuous Glucose Transmitter (DEXCOM G6 TRANSMITTER) MISC 1 application  by Does not apply route continuous as needed. 05/01/23   Thapa, [place], MD  escitalopram  (LEXAPRO ) 20 MG tablet Take 1 tablet (20 mg total) by mouth daily. 02/17/22   Viviana Aleck DASEN, FNP  fluticasone (FLONASE) 50 MCG/ACT nasal spray Place 1 spray into both nostrils daily as needed for  allergies. 02/06/19   [provider]  Glucagon  (BAQSIMI  ONE PACK) 3 MG/DOSE POWD Place 1 Dose into the nose as needed. 10/07/19   Verdon Darnel, NP  glucose blood test strip Use as instructed 05/13/21   Verdon Darnel, NP  insulin  aspart (NOVOLOG  FLEXPEN) 100 UNIT/ML FlexPen As per carb count 1:5 INJECT UP TO 50 UNITS SUBCUTANEOUSLY DAILY 04/20/23   Thapa, Sudan, MD  insulin  aspart (NOVOLOG ) 100 UNIT/ML injection 50 units max daily dose per pump 05/12/23   Thapa, [place], MD  Insulin  Degludec FlexTouch 100 UNIT/ML SOPN INJECT 32 UNITS  INTO THE SKIN DAILY. 05/12/23   Thapa, [place], MD  Insulin  Disposable Pump (OMNIPOD 5 DEXG7G6 PODS GEN 5) MISC 1 each by Does not apply route every 3 (three) days. 08/01/23   Thapa, Sudan, MD  insulin  glargine (LANTUS  SOLOSTAR) 100 UNIT/ML Solostar Pen Inject 32 Units into the skin daily. 05/18/23   Thapa, [place], MD  Insulin  Pen Needle (BD PEN NEEDLE NANO U/F) 32G X 4 MM MISC Use with insulin  pens 6x daily 04/20/23   Thapa, Sudan, MD  norgestimate -ethinyl estradiol  (SPRINTEC 28) 0.25-35 MG-MCG tablet TAKE 1 TABLET DAILY. DISCARD PLACEBOS AND TAKE ACTIVE PILLS FOR CONTINUOUS CYCLING 02/17/22   Viviana Fitch T, FNP  prazosin  (MINIPRESS ) 2 MG capsule Take 1 capsule (2 mg total) by mouth at bedtime. 02/17/22   Viviana Fitch DASEN, FNP    Physical Exam: Vitals:   08/17/23 0230 08/17/23 0459 08/17/23 0535 08/17/23 0643  BP: (!) 94/45 (!) 83/51 (!) 85/61   Pulse:  90 100   Resp: 16 17 16    Temp:    (!) 97.4 F (36.3 C)  TempSrc:    Oral  SpO2:  97% 99%    Gen Exam:Alert awake-not in any distress HEENT:atraumatic, normocephalic Chest: B/L clear to auscultation anteriorly CVS:S1S2 regular Abdomen:soft non tender, non distended Extremities:no edema Neurology: Non focal Skin: no rash   Data Reviewed:    Latest Ref Rng & Units 08/16/2023    8:32 PM 08/16/2023    5:44 PM 08/16/2023    4:42 PM  CBC  WBC 4.0 - 10.5 K/uL   6.0   Hemoglobin 12.0 - 15.0 g/dL 86.0  85.3  84.4   Hematocrit 36.0 - 46.0 % 41.0  43.0  44.4   Platelets 150 - 400 K/uL   261         Latest Ref Rng & Units 08/17/2023    5:52 AM 08/17/2023    3:13 AM 08/17/2023   12:47 AM  BMP  Glucose 70 - 99 mg/dL 751  750  782   BUN 6 - 20 mg/dL 13  16  17    Creatinine 0.44 - 1.00 mg/dL 9.34  9.36  9.34   Sodium 135 - 145 mmol/L 133  135  133   Potassium 3.5 - 5.1 mmol/L 3.5  3.6  3.9   Chloride 98 - 111 mmol/L 106  106  105   CO2 22 - 32 mmol/L 21  21  18    Calcium  8.9 - 10.3 mg/dL 7.9  8.2  8.1      Assessment and Plan: DKA Likely  due to some issues with her insulin  pump-not sure if her pump was actually malfunctioning or if her insulin  pump needle was bent. Will continue IV insulin  for now-suspect she is already out of DKA-will get diabetic coordinator to evaluate her insulin  pump-and see if we can place her back on insulin  pump.  If her insulin  pump is indeed  malfunctioning-she will require basal/bolus regimen.  She will likely be discharged home later today.  DM-1 Longstanding history of DM-1-since 23 years of age Poor long-term control-most recent A1c in September 2024 was 13.7 She follows with [facility] endocrinology See above regarding plans to attempt to try to place her back on insulin  pump.  Fibromyalgia Stable-she is on Lyrica.   Advance Care Planning:   Code Status: Full Code   Consults: None  Family Communication: None  Severity of Illness: The appropriate patient status for this patient is OBSERVATION. Observation status is judged to be reasonable and necessary in order to provide the required intensity of service to ensure the patient's safety. The patient's presenting symptoms, physical exam findings, and initial radiographic and laboratory data in the context of their medical condition is felt to place them at decreased risk for further clinical deterioration. Furthermore, it is anticipated that the patient will be medically stable for discharge from the hospital within 2 midnights of admission.   Author: Donalda Applebaum, MD 08/17/2023 7:58 AM  For on call review [email or web address].

## 2023-08-17 NOTE — ED Notes (Signed)
 MD gave ok to provide pt with a snack. Crackers given

## 2023-08-17 NOTE — Plan of Care (Signed)

## 2023-08-17 NOTE — ED Notes (Signed)
 Report called to [facility] and given to [place]

## 2023-08-17 NOTE — Inpatient Diabetes Management (Signed)
 Inpatient Diabetes Program Recommendations  AACE/ADA: New Consensus Statement on Inpatient Glycemic Control (2015)  Target Ranges:  Prepandial:   less than 140 mg/dL      Peak postprandial:   less than 180 mg/dL (1-2 hours)      Critically ill patients:  140 - 180 mg/dL   Lab Results  Component Value Date   GLUCAP 119 (H) 08/17/2023   HGBA1C 13.7 (A) 04/20/2023    Review of Glycemic Control  Diabetes history: DM type1 Outpatient Diabetes medications: Omnipod 5, Dexcom G7, Novolog  insulin  in the pump Basal rate: 1.0u/hr Sensitivity 1 unit of insulin  drops glucose 30 points Target glucose: 120 with correction over 150  Carb ratio: 1 unit of insulin  for every 5 grams of carbohydrates Insulin  on board: 4 hours  Current orders for Inpatient glycemic control:  Insulin  pump order set transitioning from IV insulin   Spoke with pt at bedside. Pt reports sleeping rough and stated when her pump was not working right she took it off and the insertion site was bent. She tried changing the site and giving SQ bolus without success of bringing the glucose levels down. Pt came into the hospital. Pt last visit with Endocrinologist was on 9/5 and the insulin  pump therapy started on 10/ with Omnipod 5. Pt was last on an insulin  pump when she was a child.   A1c very high at Endo visit. Pt was rationing insulin  to get to new Endo since she aged out of her Peds Endo. Glucose trends better now.  Pt started her insulin  pump here at 0907. I supervised pt placing it on. Overlap IV insulin  1 hour from this time.   Thanks,  Clotilda Bull RN, MSN, BC-ADM Inpatient Diabetes Coordinator Team Pager [phone] (8a-5p)

## 2023-09-16 DIAGNOSIS — Z419 Encounter for procedure for purposes other than remedying health state, unspecified: Secondary | ICD-10-CM | POA: Diagnosis not present

## 2023-10-11 DIAGNOSIS — L739 Follicular disorder, unspecified: Secondary | ICD-10-CM | POA: Diagnosis not present

## 2023-10-11 DIAGNOSIS — L858 Other specified epidermal thickening: Secondary | ICD-10-CM | POA: Diagnosis not present

## 2023-10-14 DIAGNOSIS — Z419 Encounter for procedure for purposes other than remedying health state, unspecified: Secondary | ICD-10-CM | POA: Diagnosis not present

## 2023-10-31 ENCOUNTER — Other Ambulatory Visit: Payer: Self-pay | Admitting: Endocrinology

## 2023-11-06 DIAGNOSIS — H5213 Myopia, bilateral: Secondary | ICD-10-CM | POA: Diagnosis not present

## 2023-11-21 ENCOUNTER — Encounter (INDEPENDENT_AMBULATORY_CARE_PROVIDER_SITE_OTHER): Payer: Self-pay

## 2023-11-25 DIAGNOSIS — Z419 Encounter for procedure for purposes other than remedying health state, unspecified: Secondary | ICD-10-CM | POA: Diagnosis not present

## 2023-12-04 ENCOUNTER — Encounter (INDEPENDENT_AMBULATORY_CARE_PROVIDER_SITE_OTHER): Payer: Self-pay

## 2023-12-14 ENCOUNTER — Encounter: Payer: Self-pay | Admitting: Endocrinology

## 2023-12-14 ENCOUNTER — Ambulatory Visit (INDEPENDENT_AMBULATORY_CARE_PROVIDER_SITE_OTHER): Admitting: Endocrinology

## 2023-12-14 VITALS — BP 110/70 | HR 97 | Resp 20 | Ht 59.0 in | Wt 113.8 lb

## 2023-12-14 DIAGNOSIS — E1065 Type 1 diabetes mellitus with hyperglycemia: Secondary | ICD-10-CM

## 2023-12-14 LAB — POCT GLYCOSYLATED HEMOGLOBIN (HGB A1C): Hemoglobin A1C: 6.9 % — AB (ref 4.0–5.6)

## 2023-12-14 NOTE — Progress Notes (Signed)
 Normal to 20 minutes  Outpatient Endocrinology Note [place] Jedediah Noda, MD  12/14/23  Patient's Name: Kelli Davis    DOB: 28-Oct-2000    MRN: [id number]                                                    REASON OF VISIT: Follow-up of type 1 diabetes mellitus  REFERRING PROVIDER: Candee Cha, NP  PCP: Patient, No Pcp Per  HISTORY OF PRESENT ILLNESS:   Jeniffer Borthwick is a 23 y.o. old female with past medical history listed below, is here for follow-up of type 1 diabetes mellitus.   Pertinent Diabetes History: Patient was diagnosed with type 1 diabetes mellitus at the age of 5-6 years, initially presented with DKA, at that time was hospitalized at [facility], was following with Dr. Cipriano Creeks at [facility] and later transferred care to pediatric endocrinology at [facility].  She used to be on insulin  pump therapy from the age of [age].  She was following with pediatric endocrinology, Floreen Hunger, NP and referred to establish care with adult endocrinology. Patient had multiple diabetes ketoacidosis and hospitalized in the past.  Compliance had an issue in the past, which is complicated by depression and grief.   She had variable control of diabetes mellitus, recent years with poorly controlled with hemoglobin A1c in the range of 13 to 14%.  Patient was started on OmniPod 5 with Dexcom G7/pump therapy in October 2024.  Chronic Diabetes Complications : Retinopathy: no. Last ophthalmology exam was done on Due Nephropathy: no Peripheral neuropathy: no Coronary artery disease: no Stroke: no  Relevant comorbidities and cardiovascular risk factors: Obesity: no Body mass index is 22.98 kg/m.  Hypertension: no Hyperlipidemia : yes  Current / Home Diabetic regimen includes:  OmniPod 5 with Dexcom G7 using NovoLog  U100.  Basal rates 12 AM: 1.0 units/hr  Sensitivity 12 AM: 1:30  Carb ratio 12 AM: 1:5  Target 120  Active insulin  time 4 hours  Prior diabetic  medications: Prior to pump therapy in October 2024 patient was taking Tresiba  32 units daily and NovoLog  as per carb ratio 1: 5.  CONTINUOUS GLUCOSE MONITORING SYSTEM (CGMS) INTERPRETATION:             OmniPod 5 Pump & Dexcom G7 Sensor Download (Reviewed and summarized below.)  Dates: April 18 to Dec 14, 2023, 14 days  GMI 8.5% CGM use 91% Automated mode 60%  Average total daily insulin :  28 units, Basal: 76%, Food Bolus: 24%/     Trends:  Frequent significant hyperglycemia postprandially related with meals and no meal boluses with blood sugar up to 300-350 range.  Blood sugar overnight and in between the meals are acceptable.  Some of the times blood sugar low with mild hypoglycemia related to meal bolus/correction for hyperglycemia after eating.  She is bolusing for meals 1-2 times a day.  No hypoglycemia in between the meals while on manual mode.    Hypoglycemia: Patient has minor hypoglycemic episodes. Patient has hypoglycemia awareness.  Factors modifying glucose control: 1.  Diabetic diet assessment: 3 meals a day.  2.  Staying active or exercising:   3.  Medication compliance: compliant most of the time.  Interval history  Pump and CGM data as reviewed above.  She has been on OmniPod since October 2024.  She is liking the pump  and her blood sugar control has been improving.  Hemoglobin A1c today 6.9%.  It has significantly improved from the prior hemoglobin A1c of 13.7%.  She has not been bolusing for meals most of the time.  Discussed about bolusing for meal for each meal and before eating.  Patient had DKA in January related to not wearing pump for several hours at that time.  She is due for diabetic eye exam.  Advised to have diabetic eye exam.  No other complaints today.   REVIEW OF SYSTEMS As per history of present illness.   PAST MEDICAL HISTORY: Past Medical History:  Diagnosis Date   ADD (attention deficit disorder)    Eating disorder    Febrile seizure  (HCC)    History of eye surgery    Physical growth delay    Type 1 diabetes mellitus not at goal [facility])     PAST SURGICAL HISTORY: Past Surgical History:  Procedure Laterality Date   EYE MUSCLE SURGERY      ALLERGIES: Allergies  Allergen Reactions   Penicillins Hives    FAMILY HISTORY:  Family History  Problem Relation Age of Onset   Cancer Maternal Grandmother    Hypertension Maternal Grandfather    Depression Maternal Grandfather    Diabetes Paternal Grandfather    Anxiety disorder Father    Depression Mother    Anxiety disorder Sister     SOCIAL HISTORY: Social History   Socioeconomic History   Marital status: Single    Spouse name: Not on file   Number of children: Not on file   Years of education: Not on file   Highest education level: Not on file  Occupational History   Occupation: Event organiser  Tobacco Use   Smoking status: Never    Passive exposure: Yes   Smokeless tobacco: Never   Tobacco comments:    Pt reports only once  Substance and Sexual Activity   Alcohol use: Yes    Comment: Pt reports only once or twice   Drug use: No   Sexual activity: Yes    Birth control/protection: Condom    Comment: Parents are not aware pt. has been sexually active with BF.  Other Topics Concern   Not on file  Social History Narrative   The child's parents are divorced. Father has remarried. Stepmother has type 1 diabetes mellitus and is on an insulin  pump.Spends time in both her mother and father's homes. Parents very cooperative about diabetes care.    Works at [employer]: Not on [employer] Insecurity: Not on file  Transportation Needs: Not on file  Physical Activity: Not on file  Stress: Not on file  Social Connections: Unknown (09/27/2022)   Received from [facility]   Social Network    Social Network: Not on file    MEDICATIONS:  Current Outpatient Medications   Medication Sig Dispense Refill   ACCU-CHEK FASTCLIX LANCETS MISC Check sugar 10 x daily 300 each 3   buPROPion  (WELLBUTRIN  XL) 300 MG 24 hr tablet Take 1 tablet (300 mg total) by mouth daily. 90 tablet 1   Continuous Glucose Sensor (DEXCOM G7 SENSOR) MISC 1 Device by Does not apply route continuous. 9 each 3   Continuous Glucose Transmitter (DEXCOM G6 TRANSMITTER) MISC 1 application  by Does not apply route continuous as needed. 1 each 3   escitalopram  (LEXAPRO ) 20 MG tablet Take 1 tablet (20 mg total) by mouth  daily. 90 tablet 1   fluticasone (FLONASE) 50 MCG/ACT nasal spray See admin instructions. 1 application to dexcom site before apply a new sensor to prevent rash     gabapentin (NEURONTIN) 800 MG tablet Take 1,000 mg by mouth at bedtime.     Glucagon  (BAQSIMI  ONE PACK) 3 MG/DOSE POWD Place 1 Dose into the nose as needed. (Patient taking differently: Place 1 Dose into the nose as needed (low BS).) 2 each 1   glucose blood test strip Use as instructed 300 each 3   insulin  aspart (NOVOLOG ) 100 UNIT/ML injection 50 UNITS MAX DAILY DOSE PER PUMP 40 mL 2   Insulin  Disposable Pump (OMNIPOD 5 DEXG7G6 PODS GEN 5) MISC 1 each by Does not apply route every 3 (three) days. 30 each 3   Insulin  Pen Needle (BD PEN NEEDLE NANO U/F) 32G X 4 MM MISC Use with insulin  pens 6x daily 300 each 6   norgestimate -ethinyl estradiol  (SPRINTEC 28) 0.25-35 MG-MCG tablet TAKE 1 TABLET DAILY. DISCARD PLACEBOS AND TAKE ACTIVE PILLS FOR CONTINUOUS CYCLING 112 tablet 4   prazosin  (MINIPRESS ) 2 MG capsule Take 1 capsule (2 mg total) by mouth at bedtime. 90 capsule 1   pregabalin (LYRICA) 75 MG capsule Take 75 mg by mouth at bedtime.     No current facility-administered medications for this visit.    PHYSICAL EXAM: Vitals:   12/14/23 0953  BP: 110/70  Pulse: 97  Resp: 20  SpO2: 99%  Weight: 113 lb 12.8 oz (51.6 kg)  Height: 4\' 11"  (1.499 m)    Body mass index is 22.98 kg/m.  Wt Readings from Last 3 Encounters:   12/14/23 113 lb 12.8 oz (51.6 kg)  06/28/23 120 lb (54.4 kg)  04/20/23 117 lb 9.6 oz (53.3 kg)    General: Well developed, well nourished female in no apparent distress.  HEENT: AT/[place], no external lesions.  Eyes: Conjunctiva clear and no icterus. Neck: Neck supple  Lungs: Respirations not labored Neurologic: Alert, oriented, normal speech Extremities / Skin: Dry. No sores or rashes noted.  Psychiatric: Does not appear depressed or anxious  Diabetic Foot Exam - Simple   No data filed     LABS Reviewed Lab Results  Component Value Date   HGBA1C 6.9 (A) 12/14/2023   HGBA1C 13.7 (A) 04/20/2023   HGBA1C 14.0 (H) 11/23/2021   No results found for: "FRUCTOSAMINE" Lab Results  Component Value Date   CHOL 329 (H) 05/18/2021   HDL 138 05/18/2021   LDLCALC 155 (H) 05/18/2021   TRIG 196 (H) 05/18/2021   CHOLHDL 2.4 05/18/2021   Lab Results  Component Value Date   MICRALBCREAT 15 10/07/2019   MICRALBCREAT 7 07/16/2018   Lab Results  Component Value Date   CREATININE 0.73 08/17/2023   Lab Results  Component Value Date   GFR 124.24 04/20/2023    ASSESSMENT / PLAN  1. Type 1 diabetes mellitus with hyperglycemia (HCC)    Diabetes Mellitus type 1, complicated by no known complications. - Diabetic status / severity: Poorly controlled.  Lab Results  Component Value Date   HGBA1C 6.9 (A) 12/14/2023    - Hemoglobin A1c goal : <6.5%  Congratulated for the better control of diabetes, hemoglobin A1c improved from 13.7 to 6.9% after being on insulin  pump.  She has been on OmniPod 5 since October 2024.  Discussed about bolusing for each meal before eating for optimal control of diabetes.  Changed carb ratio as follows.  She had occasional hypoglycemia following meal boluses.  -  Medications: See below  OmniPod 5 with Dexcom G7 using NovoLog  U100.  Basal rates 12 AM: 1.0 units/hr  Sensitivity 12 AM: 1:30  Carb ratio 12 AM: 1:5, changed to 1: 10.  Target  120  Active insulin  time 4 hours   - Home glucose testing: Dexcom G7/CGM and check as needed.  - Discussed/ Gave Hypoglycemia treatment plan.  # Consult : Not required at this time.  # Annual urine for microalbuminuria/ creatinine ratio.  Will check today. Last  Lab Results  Component Value Date   MICRALBCREAT 15 10/07/2019    # Foot check nightly.  # Annual dilated diabetic eye exams.  Advised to have diabetic eye exam.  - Diet: Make healthy diabetic food choices - Life style / activity / exercise: Discussed.  2. Blood pressure  -  BP Readings from Last 1 Encounters:  12/14/23 110/70    - Control is in target.  - No change in current plans.  3. Lipid status / Hyperlipidemia - Last  Lab Results  Component Value Date   LDLCALC 155 (H) 05/18/2021    Diagnoses and all orders for this visit:  Type 1 diabetes mellitus with hyperglycemia (HCC) -     POCT glycosylated hemoglobin (Hb A1C) -     Microalbumin / creatinine urine ratio   DISPOSITION Follow up in clinic in 3 months suggested.   All questions answered and patient verbalized understanding of the plan.  [place] Koury Roddy, MD [facility] Endocrinology [facility] [address] [place], [address] [place], [place] [address] Phone # [phone]  At least part of this note was generated using voice recognition software. Inadvertent word errors may have occurred, which were not recognized during the proofreading process.

## 2023-12-14 NOTE — Patient Instructions (Signed)
 Latest Reference Range & Units 11/23/21 20:25 04/20/23 15:50 12/14/23 10:16  Hemoglobin A1C 4.0 - 5.6 % 14.0 (H) 13.7 ! Pend 6.9 !  (H): Data is abnormally high !: Data is abnormal

## 2023-12-15 LAB — MICROALBUMIN / CREATININE URINE RATIO
Creatinine, Urine: 133 mg/dL (ref 20–275)
Microalb Creat Ratio: 3 mg/g{creat} (ref <30)
Microalb, Ur: 0.4 mg/dL

## 2023-12-25 DIAGNOSIS — Z419 Encounter for procedure for purposes other than remedying health state, unspecified: Secondary | ICD-10-CM | POA: Diagnosis not present

## 2023-12-28 ENCOUNTER — Encounter: Payer: Self-pay | Admitting: Endocrinology

## 2023-12-28 NOTE — Telephone Encounter (Signed)
 Spoke to pt regarding CGM. Advise pt that the office had samples if she would like she come stop by the office and pick one up.

## 2024-01-01 ENCOUNTER — Other Ambulatory Visit (HOSPITAL_COMMUNITY): Payer: Self-pay

## 2024-01-01 ENCOUNTER — Telehealth: Payer: Self-pay

## 2024-01-01 NOTE — Telephone Encounter (Signed)
 Pharmacy Patient Advocate Encounter   Received notification from CoverMyMeds that prior authorization for Dexcom g7 sensor is required/requested.   Insurance verification completed.   The patient is insured through [facility] .   Per test claim: PA required; PA submitted to above mentioned insurance via CoverMyMeds Key/confirmation #/EOC [id number] Status is pending

## 2024-01-02 NOTE — Telephone Encounter (Signed)
 Pharmacy Patient Advocate Encounter  Received notification from [facility] that Prior Authorization for  Dexcom g7 sensor has been APPROVED from 12-18-2023 to 12-31-2024   PA #/Case ID/Reference #: [id number]

## 2024-01-05 ENCOUNTER — Other Ambulatory Visit: Payer: Self-pay

## 2024-01-05 ENCOUNTER — Telehealth: Payer: Self-pay

## 2024-01-05 ENCOUNTER — Encounter: Payer: Self-pay | Admitting: Endocrinology

## 2024-01-05 DIAGNOSIS — E1065 Type 1 diabetes mellitus with hyperglycemia: Secondary | ICD-10-CM

## 2024-01-05 MED ORDER — DEXCOM G7 SENSOR MISC: 1.0000 | Status: DC

## 2024-01-05 MED ORDER — DEXCOM G7 SENSOR MISC: 1.0000 | 3 refills | Status: DC

## 2024-01-05 NOTE — Telephone Encounter (Signed)
 Sample of Dexcom G7 labeled, charted, and placed a front desk for patient. Patient aware.

## 2024-01-16 ENCOUNTER — Other Ambulatory Visit: Payer: Self-pay

## 2024-01-16 ENCOUNTER — Encounter: Payer: Self-pay | Admitting: Endocrinology

## 2024-01-16 DIAGNOSIS — E1065 Type 1 diabetes mellitus with hyperglycemia: Secondary | ICD-10-CM

## 2024-01-16 MED ORDER — DEXCOM G7 SENSOR MISC
1.0000 | Status: DC
Start: 2024-01-16 — End: 2024-06-24

## 2024-01-25 DIAGNOSIS — Z419 Encounter for procedure for purposes other than remedying health state, unspecified: Secondary | ICD-10-CM | POA: Diagnosis not present

## 2024-02-24 DIAGNOSIS — Z419 Encounter for procedure for purposes other than remedying health state, unspecified: Secondary | ICD-10-CM | POA: Diagnosis not present

## 2024-03-13 ENCOUNTER — Other Ambulatory Visit: Payer: Self-pay

## 2024-03-13 ENCOUNTER — Encounter: Payer: Self-pay | Admitting: Endocrinology

## 2024-03-13 MED ORDER — DEXCOM G7 SENSOR MISC: 0 refills | Status: AC

## 2024-03-14 NOTE — Telephone Encounter (Signed)
 Patient picked up samples on 03/14/24 from front desk

## 2024-03-26 DIAGNOSIS — Z419 Encounter for procedure for purposes other than remedying health state, unspecified: Secondary | ICD-10-CM | POA: Diagnosis not present

## 2024-04-04 ENCOUNTER — Ambulatory Visit: Admitting: Endocrinology

## 2024-04-26 DIAGNOSIS — Z419 Encounter for procedure for purposes other than remedying health state, unspecified: Secondary | ICD-10-CM | POA: Diagnosis not present

## 2024-04-29 ENCOUNTER — Other Ambulatory Visit: Payer: Self-pay

## 2024-04-29 DIAGNOSIS — E1065 Type 1 diabetes mellitus with hyperglycemia: Secondary | ICD-10-CM

## 2024-04-29 MED ORDER — OMNIPOD 5 DEXG7G6 PODS GEN 5 MISC: 1.0000 | 3 refills | Status: AC

## 2024-05-31 ENCOUNTER — Ambulatory Visit: Admitting: Endocrinology

## 2024-06-07 ENCOUNTER — Encounter: Payer: Self-pay | Admitting: Endocrinology

## 2024-06-07 ENCOUNTER — Ambulatory Visit: Payer: Self-pay | Admitting: Endocrinology

## 2024-06-07 ENCOUNTER — Ambulatory Visit (INDEPENDENT_AMBULATORY_CARE_PROVIDER_SITE_OTHER): Admitting: Endocrinology

## 2024-06-07 VITALS — BP 100/70 | HR 67 | Resp 16 | Ht 59.0 in | Wt 119.0 lb

## 2024-06-07 DIAGNOSIS — E1065 Type 1 diabetes mellitus with hyperglycemia: Secondary | ICD-10-CM | POA: Diagnosis not present

## 2024-06-07 LAB — POCT GLYCOSYLATED HEMOGLOBIN (HGB A1C): Hemoglobin A1C: 8.5 % — AB (ref 4.0–5.6)

## 2024-06-07 NOTE — Progress Notes (Signed)
 Outpatient Endocrinology Note [place] Ruthanne Mcneish, MD  06/07/24  Patient's Name: Kelli Davis    DOB: [birth year]    MRN: [id number]                                                    REASON OF VISIT: Follow-up of type 1 diabetes mellitus  REFERRING PROVIDER: Verdon Darnel, NP  PCP: Patient, No Pcp Per  HISTORY OF PRESENT ILLNESS:   Kelli Davis is a 23 y.o. old female with past medical history listed below, is here for follow-up of type 1 diabetes mellitus.   Pertinent Diabetes History: Patient was diagnosed with type 1 diabetes mellitus at the age of 5-6 years, initially presented with DKA, at that time was hospitalized at [facility], was following with Dr. Dannielle at [facility] and later transferred care to pediatric endocrinology at [facility].  She used to be on insulin  pump therapy from the age of [age].  She was following with pediatric endocrinology, Verdon Mirza, NP and referred to establish care with adult endocrinology. Patient had multiple diabetes ketoacidosis and hospitalized in the past.  Compliance had an issue in the past, which is complicated by depression and grief.   She had variable control of diabetes mellitus, recent years with uncontrolled type 1 diabetes mellitus.   Patient was started on OmniPod 5 with Dexcom G7 pump therapy in October 2024.  Chronic Diabetes Complications : Retinopathy: no. Last ophthalmology exam was done on annually, reportedly /[facility]. Nephropathy: no Peripheral neuropathy: no Coronary artery disease: no Stroke: no  Relevant comorbidities and cardiovascular risk factors: Obesity: no Body mass index is 24.04 kg/m.  Hypertension: no Hyperlipidemia : yes  Current / Home Diabetic regimen includes:  OmniPod 5 with Dexcom G7 using NovoLog  U100.  Basal rates 12 AM: 1.0 units/hr  Sensitivity 12 AM: 1:30  Carb ratio 12 AM: 1:10  Target 120  Active insulin  time 4 hours  Prior diabetic  medications: Prior to pump therapy in October 2024 patient was taking Tresiba  32 units daily and NovoLog  as per carb ratio 1: 5.  CONTINUOUS GLUCOSE MONITORING SYSTEM (CGMS) INTERPRETATION:             OmniPod 5 Pump & Dexcom G7 Sensor Download (Reviewed and summarized below.)  Dates: October 11 to June 07, 2024, 14 days  Average total daily insulin : 27.6 units, Basal: 88%, Food Bolus: 12% Automated mode 41%, manual mode 59%.    Trends:  Frequent hyperglycemia with blood sugar 200 - 300 range mostly and rarely up to 400 range postprandially related to meals and mostly related to no meal bolus and sometimes late meal bolus.  Exiting out of auto mode due to significant hyperglycemia frequently.  When on manual mode blood sugar trending down with occasional mild hypoglycemia with the basal insulin .    Hypoglycemia: Patient has minor hypoglycemic episodes. Patient has hypoglycemia awareness.  Factors modifying glucose control: 1.  Diabetic diet assessment: 3 meals a day.  2.  Staying active or exercising:   3.  Medication compliance: compliant most of the time.  Interval history  Insulin  pump and CGM data as reviewed above.  Frequent hypoglycemia.  Hemoglobin A1c worsened to 8.5%.  No numbness and ting of the feet.  No vision problem.  No other complaints today.  REVIEW OF SYSTEMS As per history  of present illness.   PAST MEDICAL HISTORY: Past Medical History:  Diagnosis Date   ADD (attention deficit disorder)    Eating disorder    Febrile seizure (HCC)    History of eye surgery    Physical growth delay    Type 1 diabetes mellitus not at goal [facility])     PAST SURGICAL HISTORY: Past Surgical History:  Procedure Laterality Date   EYE MUSCLE SURGERY      ALLERGIES: Allergies  Allergen Reactions   Penicillins Hives    FAMILY HISTORY:  Family History  Problem Relation Age of Onset   Cancer Maternal Grandmother    Hypertension Maternal Grandfather    Depression  Maternal Grandfather    Diabetes Paternal Grandfather    Anxiety disorder Father    Depression Mother    Anxiety disorder Sister     SOCIAL HISTORY: Social History   Socioeconomic History   Marital status: Single    Spouse name: Not on file   Number of children: Not on file   Years of education: Not on file   Highest education level: Not on file  Occupational History   Occupation: Event organiser  Tobacco Use   Smoking status: Never    Passive exposure: Yes   Smokeless tobacco: Never   Tobacco comments:    Pt reports only once  Substance and Sexual Activity   Alcohol use: Yes    Comment: Pt reports only once or twice   Drug use: No   Sexual activity: Yes    Birth control/protection: Condom    Comment: Parents are not aware pt. has been sexually active with BF.  Other Topics Concern   Not on file  Social History Narrative   The child's parents are divorced. Father has remarried. Stepmother has type 1 diabetes mellitus and is on an insulin  pump.Spends time in both her mother and father's homes. Parents very cooperative about diabetes care.    Works at [employer]: Not on [employer] Insecurity: Not on file  Transportation Needs: Not on file  Physical Activity: Not on file  Stress: Not on file  Social Connections: Unknown (09/27/2022)   Received from [facility]   Social Network    Social Network: Not on file    MEDICATIONS:  Current Outpatient Medications  Medication Sig Dispense Refill   ACCU-CHEK FASTCLIX LANCETS MISC Check sugar 10 x daily 300 each 3   buPROPion  (WELLBUTRIN  XL) 300 MG 24 hr tablet Take 1 tablet (300 mg total) by mouth daily. 90 tablet 1   Continuous Glucose Sensor (DEXCOM G7 SENSOR) MISC 1 Device by Does not apply route continuous.     Continuous Glucose Sensor (DEXCOM G7 SENSOR) MISC Use to check glucose continuously, change sensor every 10 days 2 each 0   Continuous Glucose  Transmitter (DEXCOM G6 TRANSMITTER) MISC 1 application  by Does not apply route continuous as needed. 1 each 3   escitalopram  (LEXAPRO ) 20 MG tablet Take 1 tablet (20 mg total) by mouth daily. 90 tablet 1   fluticasone (FLONASE) 50 MCG/ACT nasal spray See admin instructions. 1 application to dexcom site before apply a new sensor to prevent rash     gabapentin (NEURONTIN) 800 MG tablet Take 1,000 mg by mouth at bedtime.     Glucagon  (BAQSIMI  ONE PACK) 3 MG/DOSE POWD Place 1 Dose into the nose as needed. (Patient taking differently: Place 1 Dose into the nose as needed (low  BS).) 2 each 1   glucose blood test strip Use as instructed 300 each 3   insulin  aspart (NOVOLOG ) 100 UNIT/ML injection 50 UNITS MAX DAILY DOSE PER PUMP 40 mL 2   Insulin  Disposable Pump (OMNIPOD 5 DEXG7G6 PODS GEN 5) MISC 1 each by Does not apply route every 3 (three) days. 30 each 3   Insulin  Pen Needle (BD PEN NEEDLE NANO U/F) 32G X 4 MM MISC Use with insulin  pens 6x daily 300 each 6   norgestimate -ethinyl estradiol  (SPRINTEC 28) 0.25-35 MG-MCG tablet TAKE 1 TABLET DAILY. DISCARD PLACEBOS AND TAKE ACTIVE PILLS FOR CONTINUOUS CYCLING 112 tablet 4   prazosin  (MINIPRESS ) 2 MG capsule Take 1 capsule (2 mg total) by mouth at bedtime. 90 capsule 1   pregabalin (LYRICA) 75 MG capsule Take 75 mg by mouth at bedtime.     No current facility-administered medications for this visit.    PHYSICAL EXAM: Vitals:   06/07/24 0823  BP: 100/70  Pulse: 67  Resp: 16  SpO2: 99%  Weight: 119 lb (54 kg)  Height: 4' 11 (1.499 m)     Body mass index is 24.04 kg/m.  Wt Readings from Last 3 Encounters:  06/07/24 119 lb (54 kg)  12/14/23 113 lb 12.8 oz (51.6 kg)  06/28/23 120 lb (54.4 kg)    General: Well developed, well nourished female in no apparent distress.  HEENT: AT/[place], no external lesions.  Eyes: Conjunctiva clear and no icterus. Neck: Neck supple  Lungs: Respirations not labored Neurologic: Alert, oriented, normal  speech Extremities / Skin: Dry.   Psychiatric: Does not appear depressed or anxious  Diabetic Foot Exam - Simple   Simple Foot Form Diabetic Foot exam was performed with the following findings: Yes 06/07/2024  8:50 AM  Visual Inspection No deformities, no ulcerations, no other skin breakdown bilaterally: Yes Sensation Testing Intact to touch and monofilament testing bilaterally: Yes Pulse Check Posterior Tibialis and Dorsalis pulse intact bilaterally: Yes Comments     LABS Reviewed Lab Results  Component Value Date   HGBA1C 8.5 (A) 06/07/2024   HGBA1C 6.9 (A) 12/14/2023   HGBA1C 13.7 (A) 04/20/2023   No results found for: FRUCTOSAMINE Lab Results  Component Value Date   CHOL 329 (H) 05/18/2021   HDL 138 05/18/2021   LDLCALC 155 (H) 05/18/2021   TRIG 196 (H) 05/18/2021   CHOLHDL 2.4 05/18/2021   Lab Results  Component Value Date   MICRALBCREAT 3 12/14/2023   MICRALBCREAT 15 10/07/2019   Lab Results  Component Value Date   CREATININE 0.73 08/17/2023   Lab Results  Component Value Date   GFR 124.24 04/20/2023    ASSESSMENT / PLAN  1. Type 1 diabetes mellitus with hyperglycemia (HCC)   2. Uncontrolled type 1 diabetes mellitus with hyperglycemia (HCC)    Diabetes Mellitus type 1, complicated by no other known complications. - Diabetic status / severity: uncontrolled.  Lab Results  Component Value Date   HGBA1C 8.5 (A) 06/07/2024    - Hemoglobin A1c goal : <6.5%  Diabetes control worsening, mostly hyperglycemia.  Exiting out of auto mode due to hyperglycemia.  Discussed about bolusing for meals all the time including for his snacks.  She is missing meal bolus significantly.   Went on manual mode tends to have hypoglycemia, changed basal rate as follows.  - Medications: See below  OmniPod 5 with Dexcom G7 using NovoLog  U100.  Basal rates 12 AM: 1.0 units/hr, changed to 0.8.  Sensitivity 12 AM: 1:30  Carb  ratio 12 AM: 1:10  Target  120  Active insulin  time 4 hours  - Home glucose testing: Dexcom G7/CGM and check as needed.  - Discussed/ Gave Hypoglycemia treatment plan.  # Consult : Not required at this time.  # Annual urine for microalbuminuria/ creatinine ratio.   Last  Lab Results  Component Value Date   MICRALBCREAT 3 12/14/2023    # Foot check nightly.  # Annual dilated diabetic eye exams.   - Diet: Make healthy diabetic food choices - Life style / activity / exercise: Discussed.  2. Blood pressure  -  BP Readings from Last 1 Encounters:  06/07/24 100/70    - Control is in target.  - No change in current plans.  3. Lipid status / Hyperlipidemia - Last  Lab Results  Component Value Date   LDLCALC 155 (H) 05/18/2021  - No indication of a statin at this time.  Diagnoses and all orders for this visit:  Type 1 diabetes mellitus with hyperglycemia (HCC) -     POCT glycosylated hemoglobin (Hb A1C)  Uncontrolled type 1 diabetes mellitus with hyperglycemia (HCC)   DISPOSITION Follow up in clinic in 3 months suggested.   All questions answered and patient verbalized understanding of the plan.  [place] Debrah Granderson, MD [facility] Endocrinology [facility] [address] [place], [address] [place], [place] [address] Phone # [phone]  At least part of this note was generated using voice recognition software. Inadvertent word errors may have occurred, which were not recognized during the proofreading process.

## 2024-06-16 ENCOUNTER — Encounter: Payer: Self-pay | Admitting: Endocrinology

## 2024-06-24 ENCOUNTER — Other Ambulatory Visit: Payer: Self-pay

## 2024-06-24 ENCOUNTER — Encounter: Payer: Self-pay | Admitting: Endocrinology

## 2024-06-24 DIAGNOSIS — E1065 Type 1 diabetes mellitus with hyperglycemia: Secondary | ICD-10-CM

## 2024-06-24 MED ORDER — INSULIN ASPART 100 UNIT/ML IJ SOLN: INTRAMUSCULAR | 2 refills | Status: AC

## 2024-06-24 MED ORDER — DEXCOM G7 SENSOR MISC: 1.0000 | 3 refills | Status: AC

## 2024-07-26 DIAGNOSIS — Z419 Encounter for procedure for purposes other than remedying health state, unspecified: Secondary | ICD-10-CM | POA: Diagnosis not present

## 2024-09-10 ENCOUNTER — Ambulatory Visit: Admitting: Endocrinology
